# Patient Record
Sex: Male | Born: 1935 | Race: White | Hispanic: No | Marital: Married | Smoking: Never smoker
Health system: Southern US, Community
[De-identification: ages and names within clinical notes are randomized; demographics above are authoritative.]

## PROBLEM LIST (undated history)

## (undated) DIAGNOSIS — I10 Essential (primary) hypertension: Secondary | ICD-10-CM

## (undated) DIAGNOSIS — E785 Hyperlipidemia, unspecified: Secondary | ICD-10-CM

## (undated) DIAGNOSIS — I219 Acute myocardial infarction, unspecified: Secondary | ICD-10-CM

## (undated) DIAGNOSIS — C801 Malignant (primary) neoplasm, unspecified: Secondary | ICD-10-CM

## (undated) DIAGNOSIS — M1990 Unspecified osteoarthritis, unspecified site: Secondary | ICD-10-CM

## (undated) DIAGNOSIS — C61 Malignant neoplasm of prostate: Secondary | ICD-10-CM

## (undated) HISTORY — PX: APPENDECTOMY: SHX54

## (undated) HISTORY — PX: BACK SURGERY: SHX140

## (undated) HISTORY — PX: PROSTATE SURGERY: SHX751

## (undated) HISTORY — PX: TONSILLECTOMY: SUR1361

## (undated) HISTORY — PX: CARDIAC CATHETERIZATION: SHX172

## (undated) HISTORY — PX: CHOLECYSTECTOMY: SHX55

---

## 2007-01-03 ENCOUNTER — Emergency Department: Payer: Self-pay | Admitting: Emergency Medicine

## 2007-01-03 ENCOUNTER — Other Ambulatory Visit: Payer: Self-pay

## 2011-01-10 ENCOUNTER — Ambulatory Visit: Payer: Self-pay | Admitting: Urology

## 2011-07-02 DIAGNOSIS — E785 Hyperlipidemia, unspecified: Secondary | ICD-10-CM | POA: Insufficient documentation

## 2011-07-02 DIAGNOSIS — Z8546 Personal history of malignant neoplasm of prostate: Secondary | ICD-10-CM | POA: Insufficient documentation

## 2011-07-04 DIAGNOSIS — I10 Essential (primary) hypertension: Secondary | ICD-10-CM | POA: Insufficient documentation

## 2012-07-16 ENCOUNTER — Ambulatory Visit: Payer: Self-pay | Admitting: Orthopedic Surgery

## 2012-07-30 ENCOUNTER — Ambulatory Visit: Payer: Self-pay | Admitting: Hematology and Oncology

## 2012-07-30 LAB — CBC CANCER CENTER
Basophil %: 1 %
Eosinophil %: 3.5 %
HCT: 41.6 % (ref 40.0–52.0)
HGB: 14.2 g/dL (ref 13.0–18.0)
Lymphocyte %: 36.9 %
MCH: 29 pg (ref 26.0–34.0)
MCV: 85 fL (ref 80–100)
Monocyte #: 0.6 x10 3/mm (ref 0.2–1.0)
Monocyte %: 8.5 %
Neutrophil %: 50.1 %
Platelet: 243 x10 3/mm (ref 150–440)
RBC: 4.9 10*6/uL (ref 4.40–5.90)
WBC: 6.8 x10 3/mm (ref 3.8–10.6)

## 2012-07-30 LAB — COMPREHENSIVE METABOLIC PANEL WITH GFR
Albumin: 4 g/dL
Alkaline Phosphatase: 104 U/L
Anion Gap: 11
BUN: 24 mg/dL — ABNORMAL HIGH
Bilirubin,Total: 0.4 mg/dL
Calcium, Total: 10.9 mg/dL — ABNORMAL HIGH
Chloride: 100 mmol/L
Co2: 28 mmol/L
Creatinine: 1.31 mg/dL — ABNORMAL HIGH
EGFR (African American): >60
EGFR (Non-African Amer.): 53 — ABNORMAL LOW
Glucose: 137 mg/dL — ABNORMAL HIGH
Osmolality: 284
Potassium: 3.8 mmol/L
SGOT(AST): 30 U/L
SGPT (ALT): 36 U/L
Sodium: 139 mmol/L
Total Protein: 7.6 g/dL

## 2012-08-11 ENCOUNTER — Encounter (HOSPITAL_COMMUNITY): Payer: Self-pay | Admitting: Pharmacy Technician

## 2012-08-12 NOTE — Pre-Procedure Instructions (Deleted)
Skiler Olden  08/12/2012   Your procedure is scheduled on:  Thursday Aug 20, 2012.  Report to [facility] 3[facility] at 11:00 AM.  Call this number if you have problems the morning of surgery: [phone]   Remember:   Do not eat food or drink liquids after midnight.   Take these medicines the morning of surgery with A SIP OF WATER: Amlodipine (Norvasc), Gabapentin (Neurontin), Metoprolol (Lopressor)   Do not wear jewelry  Do not wear lotions or colognes. You may wear deodorant.  Men may shave face and neck.  Do not bring valuables to the hospital.  Contacts, dentures or bridgework may not be worn into surgery.  Leave suitcase in the car. After surgery it may be brought to your room.  For patients admitted to the hospital, checkout time is 11:00 AM the day of discharge.   Patients discharged the day of surgery will not be allowed to drive home.  Name and phone number of your driver: Family/Friend  Special Instructions: Shower using CHG 2 nights before surgery and the night before surgery.  If you shower the day of surgery use CHG.  Use special wash - you have one bottle of CHG for all showers.  You should use approximately 1/3 of the bottle for each shower.   Please read over the following fact sheets that you were given: Pain Booklet, Coughing and Deep Breathing, MRSA Information and Surgical Site Infection Prevention

## 2012-08-13 ENCOUNTER — Encounter (HOSPITAL_COMMUNITY)
Admission: RE | Admit: 2012-08-13 | Discharge: 2012-08-13 | Disposition: A | Discharge status: Home / Self Care | Payer: Medicare Other | Source: Ambulatory Visit | Attending: Neurosurgery | Admitting: Neurosurgery

## 2012-08-13 ENCOUNTER — Encounter (HOSPITAL_COMMUNITY): Payer: Self-pay

## 2012-08-13 ENCOUNTER — Ambulatory Visit (HOSPITAL_COMMUNITY)
Admission: RE | Admit: 2012-08-13 | Discharge: 2012-08-13 | Disposition: A | Discharge status: Home / Self Care | Payer: Medicare Other | Source: Ambulatory Visit | Attending: Neurosurgery | Admitting: Neurosurgery

## 2012-08-13 DIAGNOSIS — Z01812 Encounter for preprocedural laboratory examination: Secondary | ICD-10-CM | POA: Insufficient documentation

## 2012-08-13 DIAGNOSIS — R9431 Abnormal electrocardiogram [ECG] [EKG]: Secondary | ICD-10-CM | POA: Insufficient documentation

## 2012-08-13 DIAGNOSIS — I10 Essential (primary) hypertension: Secondary | ICD-10-CM | POA: Insufficient documentation

## 2012-08-13 DIAGNOSIS — Z01818 Encounter for other preprocedural examination: Secondary | ICD-10-CM | POA: Insufficient documentation

## 2012-08-13 DIAGNOSIS — I4510 Unspecified right bundle-branch block: Secondary | ICD-10-CM | POA: Insufficient documentation

## 2012-08-13 DIAGNOSIS — I252 Old myocardial infarction: Secondary | ICD-10-CM | POA: Insufficient documentation

## 2012-08-13 DIAGNOSIS — Z01810 Encounter for preprocedural cardiovascular examination: Secondary | ICD-10-CM | POA: Insufficient documentation

## 2012-08-13 HISTORY — DX: Unspecified osteoarthritis, unspecified site: M19.90

## 2012-08-13 HISTORY — DX: Acute myocardial infarction, unspecified: I21.9

## 2012-08-13 HISTORY — DX: Malignant (primary) neoplasm, unspecified: C80.1

## 2012-08-13 HISTORY — DX: Essential (primary) hypertension: I10

## 2012-08-13 HISTORY — DX: Hyperlipidemia, unspecified: E78.5

## 2012-08-13 LAB — CBC
HCT: 38.9 % — ABNORMAL LOW (ref 39.0–52.0)
Platelets: 249 10*3/uL (ref 150–400)
RDW: 13.8 % (ref 11.5–15.5)
WBC: 7.1 10*3/uL (ref 4.0–10.5)

## 2012-08-13 LAB — BASIC METABOLIC PANEL
Chloride: 102 mEq/L (ref 96–112)
Creatinine, Ser: 1.08 mg/dL (ref 0.50–1.35)
GFR calc Af Amer: 76 mL/min — ABNORMAL LOW (ref >90)
Sodium: 138 mEq/L (ref 135–145)

## 2012-08-13 LAB — SURGICAL PCR SCREEN
MRSA, PCR: NEGATIVE
Staphylococcus aureus: NEGATIVE

## 2012-08-13 NOTE — Pre-Procedure Instructions (Signed)
Carlos Perkins  08/13/2012   Your procedure is scheduled on:  Thursday Aug 20, 2012.  Report to [facility] 3[facility] at 11:00 AM.  Call this number if you have problems the morning of surgery: [phone]   Remember:   Do not eat food or drink liquids after midnight.   Take these medicines the morning of surgery with A SIP OF WATER: Amlodipine (Norvasc) & Metoprolol (Lopressor)   Do not wear jewelry  Do not wear lotions or colognes. You may wear deodorant.  Men may shave face and neck.  Do not bring valuables to the hospital.  Contacts, dentures or bridgework may not be worn into surgery.  Leave suitcase in the car. After surgery it may be brought to your room.  For patients admitted to the hospital, checkout time is 11:00 AM the day of discharge.   Patients discharged the day of surgery will not be allowed to drive home.  Name and phone number of your driver: Family/Friend  Special Instructions: Shower using CHG 2 nights before surgery and the night before surgery.  If you shower the day of surgery use CHG.  Use special wash - you have one bottle of CHG for all showers.  You should use approximately 1/3 of the bottle for each shower.   Please read over the following fact sheets that you were given: Pain Booklet, Coughing and Deep Breathing, MRSA Information and Surgical Site Infection Prevention

## 2012-08-13 NOTE — Progress Notes (Signed)
Carlos Perkins called [name] at [facility] and requested that Dr. Gerlene Fee enter orders into [facility]. [name] informed Carlos Perkins that Dr. Gerlene Fee was out of the office at this time on vacation.

## 2012-08-13 NOTE — Progress Notes (Signed)
Patient informed Nurse that he had a cardiac cath at [facility] after having a MI. Records requested. Cardiologist is Dr. Beryle Beams office [phone] fax [phone]. PCP is Dr. Dossie Arbour in [place], [place].

## 2012-08-17 NOTE — Progress Notes (Addendum)
Anesthesia chart review:  Patient is a 79 year old male posted for one level lumbar laminectomy/decopmression microdiscectomy on 08/20/12 by Dr. Gerlene Fee.  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  PCP was reported as Dr. Vonita Moss, but last office note was from 11/19/10.    Cardiologist was reported as Dr. Maisie Fus Povsic at [facility].  Currently, I only have a cardiac cath and EKG available.    Cardiac cath on 01/03/07 showed 100% stenosis of mid RCA s/p aspiration of thrombus and PTCA/BMS placement.  There was "insignificant" CAD of the left main (30%), LAD (proximal 30%, mid 50%), LCX. (proximal 30%, small RAMUS INT 70%)  EF 43%.     EKG on 08/13/12 showed NSR, right BBB, possible inferior infarct (age undetermined).    CXR on 08/13/12 showed no active disease. Mild degenerative changes thoracic spine.   Preoperative labs noted.  This chart had previously been reviewed by Grafton Folk, PA-C last week with recommendation for cardiac clearance preoperatively.  I spoke with [name] at [facility] today.  They have requested clearance from Dr. Beryle Beams, but have yet to hear back from him.  I'll follow-up once additional records available.  Velna Ochs [facility] Phone [phone] 08/17/2012 4:20 PM  Addendum: 08/19/12 1700 Dr. Trudee Grip office did receive a note of cardiac clearance from Dr. Beryle Beams.  Since patient was asymptomatic and fairly active, he did not recommend additional studies at this time.  He did feel that due to patient's history of CAD, he would be at somewhat higher risk for preoperative events.    At this point, [facility] has not faxed additional records because they were not included on the original medical release form.  Patient was last seen in April 2014.  I'll ask the nursing staff to have  patient update his medical release form on arrival and to re-request records at that time in hopes that we will have patient's most recent cardiology records for his anesthesiologist to review before surgery.

## 2012-08-20 ENCOUNTER — Encounter (HOSPITAL_COMMUNITY): Payer: Self-pay | Admitting: Anesthesiology

## 2012-08-20 ENCOUNTER — Inpatient Hospital Stay (HOSPITAL_COMMUNITY)
Admission: RE | Admit: 2012-08-20 | Discharge: 2012-08-21 | DRG: 491 | Disposition: A | Discharge status: Home / Self Care | Payer: Medicare Other | Source: Ambulatory Visit | Attending: Neurosurgery | Admitting: Neurosurgery

## 2012-08-20 ENCOUNTER — Inpatient Hospital Stay (HOSPITAL_COMMUNITY): Payer: Medicare Other

## 2012-08-20 ENCOUNTER — Inpatient Hospital Stay (HOSPITAL_COMMUNITY): Payer: Medicare Other | Admitting: Anesthesiology

## 2012-08-20 ENCOUNTER — Encounter (HOSPITAL_COMMUNITY)
Admission: RE | Disposition: A | Discharge status: Home / Self Care | Payer: Self-pay | Source: Ambulatory Visit | Attending: Neurosurgery

## 2012-08-20 DIAGNOSIS — Z9861 Coronary angioplasty status: Secondary | ICD-10-CM

## 2012-08-20 DIAGNOSIS — Z8546 Personal history of malignant neoplasm of prostate: Secondary | ICD-10-CM

## 2012-08-20 DIAGNOSIS — I252 Old myocardial infarction: Secondary | ICD-10-CM

## 2012-08-20 DIAGNOSIS — E785 Hyperlipidemia, unspecified: Secondary | ICD-10-CM | POA: Diagnosis present

## 2012-08-20 DIAGNOSIS — I2510 Atherosclerotic heart disease of native coronary artery without angina pectoris: Secondary | ICD-10-CM | POA: Diagnosis present

## 2012-08-20 DIAGNOSIS — I10 Essential (primary) hypertension: Secondary | ICD-10-CM | POA: Diagnosis present

## 2012-08-20 DIAGNOSIS — Z79899 Other long term (current) drug therapy: Secondary | ICD-10-CM

## 2012-08-20 DIAGNOSIS — M5126 Other intervertebral disc displacement, lumbar region: Principal | ICD-10-CM | POA: Diagnosis present

## 2012-08-20 DIAGNOSIS — Z7982 Long term (current) use of aspirin: Secondary | ICD-10-CM

## 2012-08-20 HISTORY — PX: LUMBAR LAMINECTOMY/DECOMPRESSION MICRODISCECTOMY: SHX5026

## 2012-08-20 SURGERY — LUMBAR LAMINECTOMY/DECOMPRESSION MICRODISCECTOMY 1 LEVEL
Anesthesia: General | Site: Back | Laterality: Left | Wound class: Clean

## 2012-08-20 MED ORDER — NEOSTIGMINE METHYLSULFATE 1 MG/ML IJ SOLN
INTRAMUSCULAR | Status: DC | PRN
  Administered 2012-08-20: 4 mg via INTRAVENOUS

## 2012-08-20 MED ORDER — KCL IN DEXTROSE-NACL 20-5-0.45 MEQ/L-%-% IV SOLN
INTRAVENOUS | Status: AC
  Administered 2012-08-20: 1000 mL
  Filled 2012-08-20: qty 1000

## 2012-08-20 MED ORDER — LISINOPRIL 40 MG PO TABS
40.0000 mg | ORAL_TABLET | Freq: Every day | ORAL | Status: DC
  Administered 2012-08-20: 40 mg via ORAL
  Filled 2012-08-20 (×2): qty 1

## 2012-08-20 MED ORDER — DEXAMETHASONE SODIUM PHOSPHATE 4 MG/ML IJ SOLN: 4.0000 mg | Freq: Four times a day (QID) | INTRAMUSCULAR | Status: AC

## 2012-08-20 MED ORDER — HYDROMORPHONE HCL PF 1 MG/ML IJ SOLN
0.2500 mg | INTRAMUSCULAR | Status: DC | PRN
  Administered 2012-08-20 (×2): 0.5 mg via INTRAVENOUS

## 2012-08-20 MED ORDER — LACTATED RINGERS IV SOLN
INTRAVENOUS | Status: DC | PRN
  Administered 2012-08-20 (×2): via INTRAVENOUS

## 2012-08-20 MED ORDER — GABAPENTIN 300 MG PO CAPS
600.0000 mg | ORAL_CAPSULE | Freq: Every day | ORAL | Status: DC
  Administered 2012-08-20: 600 mg via ORAL
  Filled 2012-08-20 (×2): qty 2

## 2012-08-20 MED ORDER — SODIUM CHLORIDE 0.9 % IV SOLN
INTRAVENOUS | Status: AC
  Filled 2012-08-20: qty 500

## 2012-08-20 MED ORDER — SODIUM CHLORIDE 0.9 % IJ SOLN
3.0000 mL | Freq: Two times a day (BID) | INTRAMUSCULAR | Status: DC
  Administered 2012-08-20: 3 mL via INTRAVENOUS

## 2012-08-20 MED ORDER — METOPROLOL TARTRATE 25 MG PO TABS
25.0000 mg | ORAL_TABLET | Freq: Two times a day (BID) | ORAL | Status: DC
Start: 2012-08-20 — End: 2012-08-21
  Administered 2012-08-20: 25 mg via ORAL
  Filled 2012-08-20 (×3): qty 1

## 2012-08-20 MED ORDER — HYDROMORPHONE HCL PF 1 MG/ML IJ SOLN: 1.0000 mg | INTRAMUSCULAR | Status: DC | PRN

## 2012-08-20 MED ORDER — ONDANSETRON HCL 4 MG/2ML IJ SOLN: 4.0000 mg | INTRAMUSCULAR | Status: DC | PRN

## 2012-08-20 MED ORDER — ACETAMINOPHEN 325 MG PO TABS: 650.0000 mg | ORAL_TABLET | ORAL | Status: DC | PRN

## 2012-08-20 MED ORDER — EPHEDRINE SULFATE 50 MG/ML IJ SOLN
INTRAMUSCULAR | Status: DC | PRN
  Administered 2012-08-20: 5 mg via INTRAVENOUS
  Administered 2012-08-20: 10 mg via INTRAVENOUS
  Administered 2012-08-20: 5 mg via INTRAVENOUS

## 2012-08-20 MED ORDER — BACITRACIN 50000 UNITS IM SOLR
INTRAMUSCULAR | Status: AC
  Filled 2012-08-20: qty 1

## 2012-08-20 MED ORDER — CEFAZOLIN SODIUM-DEXTROSE 2-3 GM-% IV SOLR
2.0000 g | Freq: Three times a day (TID) | INTRAVENOUS | Status: DC
  Administered 2012-08-20 – 2012-08-21 (×2): 2 g via INTRAVENOUS
  Filled 2012-08-20 (×3): qty 50

## 2012-08-20 MED ORDER — LIDOCAINE HCL (CARDIAC) 20 MG/ML IV SOLN
INTRAVENOUS | Status: DC | PRN
  Administered 2012-08-20: 80 mg via INTRAVENOUS

## 2012-08-20 MED ORDER — KETOROLAC TROMETHAMINE 30 MG/ML IJ SOLN
15.0000 mg | Freq: Four times a day (QID) | INTRAMUSCULAR | Status: DC
  Administered 2012-08-20 – 2012-08-21 (×3): 15 mg via INTRAVENOUS
  Filled 2012-08-20 (×7): qty 1

## 2012-08-20 MED ORDER — THROMBIN 5000 UNITS EX SOLR
CUTANEOUS | Status: DC | PRN
  Administered 2012-08-20 (×2): 5000 [IU] via TOPICAL

## 2012-08-20 MED ORDER — MENTHOL 3 MG MT LOZG: 1.0000 | LOZENGE | OROMUCOSAL | Status: DC | PRN

## 2012-08-20 MED ORDER — ZOLPIDEM TARTRATE 5 MG PO TABS: 5.0000 mg | ORAL_TABLET | Freq: Every evening | ORAL | Status: DC | PRN

## 2012-08-20 MED ORDER — 0.9 % SODIUM CHLORIDE (POUR BTL) OPTIME
TOPICAL | Status: DC | PRN
  Administered 2012-08-20: 1000 mL

## 2012-08-20 MED ORDER — KETOROLAC TROMETHAMINE 15 MG/ML IJ SOLN
INTRAMUSCULAR | Status: DC | PRN
  Administered 2012-08-20: 15 mg via INTRAVENOUS

## 2012-08-20 MED ORDER — SODIUM CHLORIDE 0.9 % IR SOLN
Status: DC | PRN
  Administered 2012-08-20: 12:00:00

## 2012-08-20 MED ORDER — DEXAMETHASONE SODIUM PHOSPHATE 4 MG/ML IJ SOLN
INTRAMUSCULAR | Status: DC | PRN
  Administered 2012-08-20: 10 mg via INTRAVENOUS

## 2012-08-20 MED ORDER — BUPIVACAINE HCL (PF) 0.5 % IJ SOLN
INTRAMUSCULAR | Status: DC | PRN
  Administered 2012-08-20: 20 mL

## 2012-08-20 MED ORDER — ONDANSETRON HCL 4 MG/2ML IJ SOLN: 4.0000 mg | Freq: Four times a day (QID) | INTRAMUSCULAR | Status: DC | PRN

## 2012-08-20 MED ORDER — HYDROMORPHONE HCL PF 1 MG/ML IJ SOLN
INTRAMUSCULAR | Status: AC
  Filled 2012-08-20: qty 1

## 2012-08-20 MED ORDER — OXYCODONE HCL 5 MG PO TABS
5.0000 mg | ORAL_TABLET | Freq: Once | ORAL | Status: AC | PRN
  Administered 2012-08-20: 5 mg via ORAL

## 2012-08-20 MED ORDER — ASPIRIN EC 81 MG PO TBEC
81.0000 mg | DELAYED_RELEASE_TABLET | Freq: Every day | ORAL | Status: DC
  Administered 2012-08-20: 81 mg via ORAL
  Filled 2012-08-20 (×2): qty 1

## 2012-08-20 MED ORDER — KCL IN DEXTROSE-NACL 20-5-0.45 MEQ/L-%-% IV SOLN
80.0000 mL/h | INTRAVENOUS | Status: DC
  Filled 2012-08-20 (×3): qty 1000

## 2012-08-20 MED ORDER — GLYCOPYRROLATE 0.2 MG/ML IJ SOLN
INTRAMUSCULAR | Status: DC | PRN
  Administered 2012-08-20: 0.6 mg via INTRAVENOUS

## 2012-08-20 MED ORDER — CHLORTHALIDONE 25 MG PO TABS
25.0000 mg | ORAL_TABLET | Freq: Every day | ORAL | Status: DC
  Administered 2012-08-20: 25 mg via ORAL
  Filled 2012-08-20 (×2): qty 1

## 2012-08-20 MED ORDER — FENTANYL CITRATE 0.05 MG/ML IJ SOLN
INTRAMUSCULAR | Status: DC | PRN
  Administered 2012-08-20: 100 ug via INTRAVENOUS
  Administered 2012-08-20 (×3): 50 ug via INTRAVENOUS

## 2012-08-20 MED ORDER — AMLODIPINE BESYLATE 10 MG PO TABS
10.0000 mg | ORAL_TABLET | Freq: Every day | ORAL | Status: DC
  Filled 2012-08-20 (×2): qty 1

## 2012-08-20 MED ORDER — HYDROCODONE-ACETAMINOPHEN 5-325 MG PO TABS: 1.0000 | ORAL_TABLET | ORAL | Status: DC | PRN

## 2012-08-20 MED ORDER — HEMOSTATIC AGENTS (NO CHARGE) OPTIME
TOPICAL | Status: DC | PRN
  Administered 2012-08-20: 1 via TOPICAL

## 2012-08-20 MED ORDER — LIDOCAINE HCL 4 % MT SOLN
OROMUCOSAL | Status: DC | PRN
  Administered 2012-08-20: 4 mL via TOPICAL

## 2012-08-20 MED ORDER — KETOROLAC TROMETHAMINE 30 MG/ML IJ SOLN
INTRAMUSCULAR | Status: AC
  Administered 2012-08-20: 15 mg
  Filled 2012-08-20: qty 1

## 2012-08-20 MED ORDER — ONDANSETRON HCL 4 MG/2ML IJ SOLN
INTRAMUSCULAR | Status: DC | PRN
  Administered 2012-08-20: 4 mg via INTRAVENOUS

## 2012-08-20 MED ORDER — ROCURONIUM BROMIDE 100 MG/10ML IV SOLN
INTRAVENOUS | Status: DC | PRN
  Administered 2012-08-20: 50 mg via INTRAVENOUS

## 2012-08-20 MED ORDER — PHENOL 1.4 % MT LIQD: 1.0000 | OROMUCOSAL | Status: DC | PRN

## 2012-08-20 MED ORDER — DEXAMETHASONE 4 MG PO TABS
4.0000 mg | ORAL_TABLET | Freq: Four times a day (QID) | ORAL | Status: AC
  Administered 2012-08-20 – 2012-08-21 (×2): 4 mg via ORAL
  Filled 2012-08-20: qty 1

## 2012-08-20 MED ORDER — ATORVASTATIN CALCIUM 10 MG PO TABS
10.0000 mg | ORAL_TABLET | Freq: Every day | ORAL | Status: DC
  Administered 2012-08-20: 10 mg via ORAL
  Filled 2012-08-20 (×2): qty 1

## 2012-08-20 MED ORDER — MIDAZOLAM HCL 5 MG/5ML IJ SOLN
INTRAMUSCULAR | Status: DC | PRN
  Administered 2012-08-20: 2 mg via INTRAVENOUS

## 2012-08-20 MED ORDER — CYCLOBENZAPRINE HCL 10 MG PO TABS: 10.0000 mg | ORAL_TABLET | Freq: Three times a day (TID) | ORAL | Status: DC | PRN

## 2012-08-20 MED ORDER — OXYCODONE HCL 5 MG PO TABS
ORAL_TABLET | ORAL | Status: AC
  Filled 2012-08-20: qty 1

## 2012-08-20 MED ORDER — OXYCODONE HCL 5 MG/5ML PO SOLN: 5.0000 mg | Freq: Once | ORAL | Status: AC | PRN

## 2012-08-20 MED ORDER — SODIUM CHLORIDE 0.9 % IJ SOLN: 3.0000 mL | INTRAMUSCULAR | Status: DC | PRN

## 2012-08-20 MED ORDER — CEFAZOLIN SODIUM-DEXTROSE 2-3 GM-% IV SOLR
INTRAVENOUS | Status: AC
  Administered 2012-08-20: 2 g via INTRAVENOUS
  Filled 2012-08-20: qty 50

## 2012-08-20 MED ORDER — ACETAMINOPHEN 650 MG RE SUPP: 650.0000 mg | RECTAL | Status: DC | PRN

## 2012-08-20 MED ORDER — PROPOFOL 10 MG/ML IV BOLUS
INTRAVENOUS | Status: DC | PRN
  Administered 2012-08-20: 150 mg via INTRAVENOUS

## 2012-08-20 SURGICAL SUPPLY — 45 items
BAG DECANTER FOR FLEXI CONT (MISCELLANEOUS) ×2 IMPLANT
BENZOIN TINCTURE PRP APPL 2/3 (GAUZE/BANDAGES/DRESSINGS) ×4 IMPLANT
BLADE SURG ROTATE 9660 (MISCELLANEOUS) ×2 IMPLANT
BRUSH SCRUB EZ PLAIN DRY (MISCELLANEOUS) ×2 IMPLANT
BUR CUTTER 7.0 ROUND (BURR) ×2 IMPLANT
BUR MATCHSTICK NEURO 3.0 LAGG (BURR) ×2 IMPLANT
CANISTER SUCTION 2500CC (MISCELLANEOUS) ×2 IMPLANT
CLOTH BEACON ORANGE TIMEOUT ST (SAFETY) ×2 IMPLANT
CONT SPEC 4OZ CLIKSEAL STRL BL (MISCELLANEOUS) ×2 IMPLANT
DERMABOND ADVANCED (GAUZE/BANDAGES/DRESSINGS) ×1
DERMABOND ADVANCED .7 DNX12 (GAUZE/BANDAGES/DRESSINGS) ×1 IMPLANT
DRAPE LAPAROTOMY 100X72X124 (DRAPES) ×2 IMPLANT
DRAPE MICROSCOPE ZEISS OPMI (DRAPES) ×2 IMPLANT
DRAPE SURG 17X23 STRL (DRAPES) ×4 IMPLANT
DRESSING TELFA 8X3 (GAUZE/BANDAGES/DRESSINGS) ×2 IMPLANT
DURAPREP 26ML APPLICATOR (WOUND CARE) ×2 IMPLANT
ELECT REM PT RETURN 9FT ADLT (ELECTROSURGICAL) ×2
ELECTRODE REM PT RTRN 9FT ADLT (ELECTROSURGICAL) ×1 IMPLANT
GAUZE SPONGE 4X4 16PLY XRAY LF (GAUZE/BANDAGES/DRESSINGS) IMPLANT
GLOVE ECLIPSE 7.5 STRL STRAW (GLOVE) ×2 IMPLANT
GLOVE ECLIPSE 8.5 STRL (GLOVE) ×2 IMPLANT
GLOVE INDICATOR 7.0 STRL GRN (GLOVE) ×2 IMPLANT
GLOVE INDICATOR 8.5 STRL (GLOVE) ×2 IMPLANT
GLOVE OPTIFIT SS 6.5 STRL BRWN (GLOVE) ×2 IMPLANT
GOWN BRE IMP SLV AUR LG STRL (GOWN DISPOSABLE) IMPLANT
GOWN BRE IMP SLV AUR XL STRL (GOWN DISPOSABLE) ×2 IMPLANT
GOWN STRL REIN 2XL LVL4 (GOWN DISPOSABLE) ×2 IMPLANT
KIT BASIN OR (CUSTOM PROCEDURE TRAY) ×2 IMPLANT
KIT ROOM TURNOVER OR (KITS) ×2 IMPLANT
NEEDLE HYPO 22GX1.5 SAFETY (NEEDLE) ×2 IMPLANT
NEEDLE SPNL 22GX3.5 QUINCKE BK (NEEDLE) ×4 IMPLANT
NS IRRIG 1000ML POUR BTL (IV SOLUTION) ×2 IMPLANT
PACK LAMINECTOMY NEURO (CUSTOM PROCEDURE TRAY) ×2 IMPLANT
PAD ARMBOARD 7.5X6 YLW CONV (MISCELLANEOUS) ×6 IMPLANT
PATTIES SURGICAL .75X.75 (GAUZE/BANDAGES/DRESSINGS) ×2 IMPLANT
RUBBERBAND STERILE (MISCELLANEOUS) ×4 IMPLANT
SPONGE GAUZE 4X4 12PLY (GAUZE/BANDAGES/DRESSINGS) ×2 IMPLANT
SPONGE SURGIFOAM ABS GEL SZ50 (HEMOSTASIS) ×2 IMPLANT
STRIP CLOSURE SKIN 1/2X4 (GAUZE/BANDAGES/DRESSINGS) ×2 IMPLANT
SUT VIC AB 2-0 OS6 18 (SUTURE) ×6 IMPLANT
SUT VIC AB 3-0 CP2 18 (SUTURE) ×2 IMPLANT
SYR 20ML ECCENTRIC (SYRINGE) ×2 IMPLANT
TOWEL OR 17X24 6PK STRL BLUE (TOWEL DISPOSABLE) ×2 IMPLANT
TOWEL OR 17X26 10 PK STRL BLUE (TOWEL DISPOSABLE) ×2 IMPLANT
WATER STERILE IRR 1000ML POUR (IV SOLUTION) ×2 IMPLANT

## 2012-08-20 NOTE — Progress Notes (Signed)
Requested last office not, ECHO ONLY HAD 2008, [facility] TO FAX ASAP.

## 2012-08-20 NOTE — Preoperative (Signed)
Beta Blockers   Reason not to administer Beta Blockers:Not Applicable 

## 2012-08-20 NOTE — Op Note (Signed)
Preop diagnosis: Herniated disc L4-5 left with L5 nerve root compression Postop diagnosis: Same Procedure: Left L4-5 intralaminar laminotomy for excision of herniated disc with operating microscope Surgeon: Makalynn Berwanger Assistant: Elsner  After being placed the prone position the patient's back was prepped and draped in the usual sterile fashion. Localizing x-ray was taken prior to incision to identify the appropriate level. Midline incision was made above the spinous processes of L4 and L5. Using Bovie cutting current the incision was carried on the spinous processes. Sub-periosteal dissection was then carried out on the left side the spinous processes and lamina and self-retaining tract was placed for exposure. X-ray showed approach the appropriate level. Using the high-speed drill the inferior one third of the L4 lamina the medial one third of the facet joint the superior one half of the L5 lamina were removed. Residual bone and ligamentum flavum removed in a piecemeal fashion. The microscope was then draped brought in the field and used for the remainder of the case. Using microdissection technique the lateral aspect of the thecal sac and L5 nerve were identified. Further coagulation was carried out down before the canal to identify the L4-5 disc was found markedly herniated beneath the nerve over the inferior fragment. After coagulating on the annulus this was incised a 15 blade. Using pituitary rongeurs and curettes a thorough displaced cleanout was carried out on the same time great care was taken to avoid injury to the neural elements this was successfully done. At this time inspection was carried out all directions for any evidence of residual compression and none could be identified. Irrigation was carried out and any bleeding control proper coagulation Gelfoam. The was then closed in multiple layers of Vicryl on the muscle fascia subcutaneous and subcuticular tissues. Dermabond Steri-Strips were placed  on the skin. Shortness was then applied the patient was extubated and taken to recovery room in stable condition.

## 2012-08-20 NOTE — Anesthesia Procedure Notes (Signed)
Procedure Name: Intubation Date/Time: 08/20/2012 11:50 AM Performed by: Armandina Gemma Pre-anesthesia Checklist: Patient identified, Timeout performed, Emergency Drugs available, Suction available and Patient being monitored Patient Re-evaluated:Patient Re-evaluated prior to inductionOxygen Delivery Method: Circle system utilized Preoxygenation: Pre-oxygenation with 100% oxygen Intubation Type: IV induction Ventilation: Mask ventilation without difficulty Laryngoscope Size: Miller and 2 Grade View: Grade I Tube type: Oral Tube size: 7.5 mm Number of attempts: 1 Airway Equipment and Method: Stylet Placement Confirmation: ETT inserted through vocal cords under direct vision,  breath sounds checked- equal and bilateral and positive ETCO2 Secured at: 22 cm Tube secured with: Tape Dental Injury: Teeth and Oropharynx as per pre-operative assessment  Comments: IV induction Hodierne- intubation AM CRNA atraumatic- teeth and mouth as preop

## 2012-08-20 NOTE — H&P (Signed)
  Carlos Perkins is an 78 y.o. male.   Chief Complaint: Back and left lower extremity pain HPI: The patient is a 78 year old gentleman who presented with back and left lower extremity pain. He was tried on conservative therapy without improvement and underwent imaging studies which showed I nerve root defect at L4-5 on the left. After failing further conservative therapy the options were discussed and the patient requested surgery and now comes for a left L4-5 microdiscectomy. I've had a long discussion with him regarding the risks and benefits of surgical intervention. The risks discussed include but are not limited to bleeding infection weakness numbness paralysis spinal fluid leakage coma and death. We have discussed alternative methods of therapy offered risks and benefits of nonintervention. He's had the opportunity to ask numerous questions and appears to understand. With this information in hand he has requested we proceed with surgery.  Past Medical History  Diagnosis Date  . Myocardial infarction   . Hypertension   . Cancer     Prostate CA approximately 11 years ago  . Hyperlipidemia   . Arthritis     Past Surgical History  Procedure Laterality Date  . Tonsillectomy    . Cholecystectomy    . Appendectomy    . Back surgery    . Cardiac catheterization      stent x 1  . Prostate surgery      No family history on file. Social History:  reports that he has never smoked. He does not have any smokeless tobacco history on file. He reports that he does not drink alcohol or use illicit drugs.  Allergies: No Known Allergies  Medications Prior to Admission  Medication Sig Dispense Refill  . amLODipine (NORVASC) 10 MG tablet Take 10 mg by mouth daily.      Marland Kitchen aspirin EC 81 MG tablet Take 81 mg by mouth daily.      Marland Kitchen atorvastatin (LIPITOR) 10 MG tablet Take 10 mg by mouth daily.      . chlorthalidone (HYGROTON) 25 MG tablet Take 25 mg by mouth daily.      Marland Kitchen gabapentin  (NEURONTIN) 300 MG capsule Take 600 mg by mouth at bedtime.      Marland Kitchen lisinopril (PRINIVIL,ZESTRIL) 40 MG tablet Take 40 mg by mouth daily.      . metoprolol tartrate (LOPRESSOR) 25 MG tablet Take 25 mg by mouth 2 (two) times daily.      . naproxen sodium (ANAPROX) 220 MG tablet Take 220-440 mg by mouth daily.        No results found for this or any previous visit (from the past 48 hour(s)). No results found.  Review of systems not obtained due to patient factors.  Blood pressure 129/83, pulse 62, temperature 98.1 F (36.7 C), resp. rate 16, SpO2 97.00%.  The patient is awake or and oriented. His no facial asymmetry. His gait is slow and somewhat antalgic. Reflexes are 1-2+ and equal. He some decreased strength at the extensor hallucis long some left and his sensation is intact Assessment/Plan Impression is that of an L5 radiculopathy on the left due to compression at L4-5 on the left. The plan is for a left L4-5 decompression and evaluation of the L4-5 disc.  [name], MD 08/20/2012, 11:28 AM

## 2012-08-20 NOTE — Transfer of Care (Signed)
Immediate Anesthesia Transfer of Care Note  Patient: Carlos Perkins  Procedure(s) Performed: Procedure(s) with comments: LUMBAR LAMINECTOMY/DECOMPRESSION MICRODISCECTOMY 1 LEVEL (Left) - lumbar four-five  Patient Location: PACU  Anesthesia Type:General  Level of Consciousness: awake and alert   Airway & Oxygen Therapy: Patient Spontanous Breathing and Patient connected to face mask oxygen  Post-op Assessment: Report given to PACU RN and Post -op Vital signs reviewed and stable  Post vital signs: Reviewed and stable  Complications: No apparent anesthesia complications

## 2012-08-20 NOTE — Anesthesia Preprocedure Evaluation (Signed)
Anesthesia Evaluation  Patient identified by MRN, date of birth, ID band Patient awake    Reviewed: Allergy & Precautions, H&P , NPO status , Patient's Chart, lab work & pertinent test results  Airway Mallampati: II  Neck ROM: full    Dental   Pulmonary          Cardiovascular hypertension, + Past MI and + Cardiac Stents     Neuro/Psych    GI/Hepatic   Endo/Other    Renal/GU      Musculoskeletal  (+) Arthritis -,   Abdominal   Peds  Hematology   Anesthesia Other Findings   Reproductive/Obstetrics                           Anesthesia Physical Anesthesia Plan  ASA: III  Anesthesia Plan: General   Post-op Pain Management:    Induction: Intravenous  Airway Management Planned: Oral ETT  Additional Equipment:   Intra-op Plan:   Post-operative Plan: Extubation in OR  Informed Consent: I have reviewed the patients History and Physical, chart, labs and discussed the procedure including the risks, benefits and alternatives for the proposed anesthesia with the patient or authorized representative who has indicated his/her understanding and acceptance.     Plan Discussed with: CRNA, Anesthesiologist and Surgeon  Anesthesia Plan Comments:         Anesthesia Quick Evaluation

## 2012-08-20 NOTE — Plan of Care (Signed)
Problem: Consults Goal: Diagnosis - Spinal Surgery Outcome: Completed/Met Date Met:  08/20/12 Microdiscectomy     

## 2012-08-20 NOTE — Progress Notes (Signed)
NOTIFIED DR. KRITZER OF NEED FOR ORDERS.

## 2012-08-20 NOTE — Anesthesia Postprocedure Evaluation (Signed)
Anesthesia Post Note  Patient: Carlos Perkins  Procedure(s) Performed: Procedure(s) (LRB): LUMBAR LAMINECTOMY/DECOMPRESSION MICRODISCECTOMY 1 LEVEL (Left)  Anesthesia type: General  Patient location: PACU  Post pain: Pain level controlled and Adequate analgesia  Post assessment: Post-op Vital signs reviewed, Patient's Cardiovascular Status Stable, Respiratory Function Stable, Patent Airway and Pain level controlled  Last Vitals:  Filed Vitals:   08/20/12 1326  BP: 140/70  Pulse: 78  Temp: 36.6 C  Resp: 15    Post vital signs: Reviewed and stable  Level of consciousness: awake, alert  and oriented  Complications: No apparent anesthesia complications

## 2012-08-21 ENCOUNTER — Encounter (HOSPITAL_COMMUNITY): Payer: Self-pay | Admitting: Neurosurgery

## 2012-08-21 MED ORDER — PNEUMOCOCCAL VAC POLYVALENT 25 MCG/0.5ML IJ INJ
0.5000 mL | INJECTION | INTRAMUSCULAR | Status: DC
  Filled 2012-08-21: qty 0.5

## 2012-08-21 MED ORDER — HYDROCODONE-ACETAMINOPHEN 5-325 MG PO TABS: 1.0000 | ORAL_TABLET | ORAL | Status: DC | PRN

## 2012-08-21 NOTE — Discharge Summary (Signed)
Physician Discharge Summary  Patient ID: Carlos Perkins MRN: [id number] DOB/AGE: May 15, 1934 78 y.o.  Admit date: 08/20/2012 Discharge date: 08/21/2012  Admission Diagnoses:  Discharge Diagnoses:  Active Problems:   * No active hospital problems. *   Discharged Condition: good  Hospital Course: Surgery one day for HNP L45. Did well. No pain the next day. Up walking without difficulty. Home POD 1 , specific instructions given.  Consults: None  Significant Diagnostic Studies: none  Treatments: surgery: Left L45 microdiscectomy  Discharge Exam: Blood pressure 123/66, pulse 65, temperature 98.1 F (36.7 C), temperature source Oral, resp. rate 18, SpO2 95.00%. Incision/Wound:clean and dry  Disposition: Final discharge disposition not confirmed  Discharge Orders   Future Orders Complete By Expires     Call MD for:  difficulty breathing, headache or visual disturbances  As directed     Call MD for:  hives  As directed     Call MD for:  persistant nausea and vomiting  As directed     Call MD for:  redness, tenderness, or signs of infection (pain, swelling, redness, odor or green/yellow discharge around incision site)  As directed     Call MD for:  severe uncontrolled pain  As directed     Call MD for:  temperature >100.4  As directed     Diet general  As directed     Discharge instructions  As directed     Comments:      Mostly bedrest. Get up 9 or 10 times each day and walk for 15-20 minutes each time. Very little sitting the first week. No riding in the car until your first post op appointment. If you had neck surgery...may shower from the chest down. If you had low back surgery....you may shower with a saran wrap covering over the incision. Take your pain medicine as needed...and other medicines that you are instructed to take. Call for an appointment...[phone].        Medication List    TAKE these medications       amLODipine 10 MG tablet  Commonly known as:   NORVASC  Take 10 mg by mouth daily.     aspirin EC 81 MG tablet  Take 81 mg by mouth daily.     atorvastatin 10 MG tablet  Commonly known as:  LIPITOR  Take 10 mg by mouth daily.     chlorthalidone 25 MG tablet  Commonly known as:  HYGROTON  Take 25 mg by mouth daily.     gabapentin 300 MG capsule  Commonly known as:  NEURONTIN  Take 600 mg by mouth at bedtime.     HYDROcodone-acetaminophen 5-325 MG per tablet  Commonly known as:  NORCO/VICODIN  Take 1-2 tablets by mouth every 4 (four) hours as needed.     lisinopril 40 MG tablet  Commonly known as:  PRINIVIL,ZESTRIL  Take 40 mg by mouth daily.     metoprolol tartrate 25 MG tablet  Commonly known as:  LOPRESSOR  Take 25 mg by mouth 2 (two) times daily.     naproxen sodium 220 MG tablet  Commonly known as:  ANAPROX  Take 220-440 mg by mouth daily.         At home rest most of the time. Get up 9 or 10 times each day and take a 15 or 20 minute walk. No riding in the car and to your first postoperative appointment. If you have neck surgery you may shower from the chest down starting on  the third postoperative day. If you had back surgery he may start showering on the third postoperative day with saran wrap wrapped around your incisional area 3 times. After the shower remove the saran wrap. Take pain medicine as needed and other medications as instructed. Call my office for an appointment.  SignedReinaldo Meeker, MD 08/21/2012, 9:42 AM

## 2012-08-30 ENCOUNTER — Ambulatory Visit: Payer: Self-pay | Admitting: Hematology and Oncology

## 2012-09-17 LAB — COMPREHENSIVE METABOLIC PANEL
Albumin: 3.6 g/dL (ref 3.4–5.0)
Alkaline Phosphatase: 112 U/L (ref 50–136)
Anion Gap: 6 — ABNORMAL LOW (ref 7–16)
BUN: 24 mg/dL — ABNORMAL HIGH (ref 7–18)
Bilirubin,Total: 0.4 mg/dL (ref 0.2–1.0)
Calcium, Total: 10.7 mg/dL — ABNORMAL HIGH (ref 8.5–10.1)
Chloride: 103 mmol/L (ref 98–107)
Creatinine: 1.5 mg/dL — ABNORMAL HIGH (ref 0.60–1.30)
Total Protein: 7 g/dL (ref 6.4–8.2)

## 2012-09-17 LAB — CBC CANCER CENTER
Eosinophil #: 0.3 x10 3/mm (ref 0.0–0.7)
Eosinophil %: 4 %
HCT: 39.2 % — ABNORMAL LOW (ref 40.0–52.0)
Lymphocyte #: 2.5 x10 3/mm (ref 1.0–3.6)
MCHC: 34.1 g/dL (ref 32.0–36.0)
Monocyte #: 0.6 x10 3/mm (ref 0.2–1.0)
Neutrophil %: 54.4 %
Platelet: 228 x10 3/mm (ref 150–440)
RDW: 14 % (ref 11.5–14.5)
WBC: 7.5 x10 3/mm (ref 3.8–10.6)

## 2012-09-18 LAB — PSA: PSA: 6.7 ng/mL — ABNORMAL HIGH (ref 0.0–4.0)

## 2012-09-29 ENCOUNTER — Ambulatory Visit: Payer: Self-pay | Admitting: Hematology and Oncology

## 2012-09-29 LAB — COMPREHENSIVE METABOLIC PANEL
Anion Gap: 7 (ref 7–16)
BUN: 24 mg/dL — ABNORMAL HIGH (ref 7–18)
Calcium, Total: 10.9 mg/dL — ABNORMAL HIGH (ref 8.5–10.1)
Co2: 30 mmol/L (ref 21–32)
EGFR (African American): 55 — ABNORMAL LOW
EGFR (Non-African Amer.): 48 — ABNORMAL LOW
Osmolality: 282 (ref 275–301)
Potassium: 4.1 mmol/L (ref 3.5–5.1)
SGOT(AST): 17 U/L (ref 15–37)
SGPT (ALT): 27 U/L (ref 12–78)
Total Protein: 7.4 g/dL (ref 6.4–8.2)

## 2012-09-29 LAB — CBC CANCER CENTER
Basophil #: 0.1 x10 3/mm (ref 0.0–0.1)
Eosinophil #: 0.3 x10 3/mm (ref 0.0–0.7)
HCT: 40.5 % (ref 40.0–52.0)
HGB: 14 g/dL (ref 13.0–18.0)
MCHC: 34.7 g/dL (ref 32.0–36.0)
Monocyte #: 0.5 x10 3/mm (ref 0.2–1.0)
Platelet: 290 x10 3/mm (ref 150–440)
RBC: 4.77 10*6/uL (ref 4.40–5.90)
RDW: 14.5 % (ref 11.5–14.5)

## 2012-10-30 ENCOUNTER — Ambulatory Visit: Payer: Self-pay | Admitting: Hematology and Oncology

## 2013-01-05 ENCOUNTER — Ambulatory Visit: Payer: Self-pay | Admitting: Hematology and Oncology

## 2013-03-02 DIAGNOSIS — M5416 Radiculopathy, lumbar region: Secondary | ICD-10-CM | POA: Insufficient documentation

## 2013-03-11 DIAGNOSIS — R339 Retention of urine, unspecified: Secondary | ICD-10-CM | POA: Insufficient documentation

## 2013-03-17 DIAGNOSIS — M48061 Spinal stenosis, lumbar region without neurogenic claudication: Secondary | ICD-10-CM | POA: Insufficient documentation

## 2013-03-30 ENCOUNTER — Ambulatory Visit: Payer: Self-pay | Admitting: Hematology and Oncology

## 2013-04-13 ENCOUNTER — Ambulatory Visit: Payer: Self-pay | Admitting: Family Medicine

## 2013-04-23 ENCOUNTER — Ambulatory Visit: Payer: Self-pay | Admitting: Hematology and Oncology

## 2013-05-02 ENCOUNTER — Ambulatory Visit: Payer: Self-pay | Admitting: Hematology and Oncology

## 2013-06-21 ENCOUNTER — Ambulatory Visit: Payer: Self-pay | Admitting: Hematology and Oncology

## 2013-06-22 ENCOUNTER — Ambulatory Visit: Payer: Self-pay | Admitting: Hematology and Oncology

## 2013-06-22 LAB — COMPREHENSIVE METABOLIC PANEL
ALBUMIN: 3.9 g/dL (ref 3.4–5.0)
ANION GAP: 6 — AB (ref 7–16)
Alkaline Phosphatase: 87 U/L
BILIRUBIN TOTAL: 0.3 mg/dL (ref 0.2–1.0)
BUN: 19 mg/dL — AB (ref 7–18)
CALCIUM: 11.2 mg/dL — AB (ref 8.5–10.1)
CO2: 31 mmol/L (ref 21–32)
CREATININE: 1.21 mg/dL (ref 0.60–1.30)
Chloride: 103 mmol/L (ref 98–107)
EGFR (African American): >60
EGFR (Non-African Amer.): 58 — ABNORMAL LOW
GLUCOSE: 117 mg/dL — AB (ref 65–99)
Osmolality: 283 (ref 275–301)
Potassium: 3.7 mmol/L (ref 3.5–5.1)
SGOT(AST): 16 U/L (ref 15–37)
SGPT (ALT): 19 U/L (ref 12–78)
SODIUM: 140 mmol/L (ref 136–145)
Total Protein: 7.5 g/dL (ref 6.4–8.2)

## 2013-06-22 LAB — CBC CANCER CENTER
BASOS PCT: 0.9 %
Basophil #: 0.1 x10 3/mm (ref 0.0–0.1)
Eosinophil #: 0.3 x10 3/mm (ref 0.0–0.7)
Eosinophil %: 4.7 %
HCT: 39.5 % — AB (ref 40.0–52.0)
HGB: 12.7 g/dL — AB (ref 13.0–18.0)
Lymphocyte #: 2.2 x10 3/mm (ref 1.0–3.6)
Lymphocyte %: 34.4 %
MCH: 26.9 pg (ref 26.0–34.0)
MCHC: 32.2 g/dL (ref 32.0–36.0)
MCV: 84 fL (ref 80–100)
MONO ABS: 0.4 x10 3/mm (ref 0.2–1.0)
MONOS PCT: 6.2 %
NEUTROS ABS: 3.4 x10 3/mm (ref 1.4–6.5)
NEUTROS PCT: 53.8 %
PLATELETS: 272 x10 3/mm (ref 150–440)
RBC: 4.73 10*6/uL (ref 4.40–5.90)
RDW: 15.9 % — ABNORMAL HIGH (ref 11.5–14.5)
WBC: 6.4 x10 3/mm (ref 3.8–10.6)

## 2013-06-23 LAB — PSA: PSA: 7.9 ng/mL — AB (ref 0.0–4.0)

## 2013-06-30 ENCOUNTER — Ambulatory Visit: Payer: Self-pay | Admitting: Hematology and Oncology

## 2015-06-08 ENCOUNTER — Encounter: Payer: Self-pay | Admitting: *Deleted

## 2016-12-10 DIAGNOSIS — M4327 Fusion of spine, lumbosacral region: Secondary | ICD-10-CM | POA: Insufficient documentation

## 2016-12-11 DIAGNOSIS — I480 Paroxysmal atrial fibrillation: Secondary | ICD-10-CM | POA: Insufficient documentation

## 2017-01-02 ENCOUNTER — Encounter: Payer: Self-pay | Admitting: Oncology

## 2017-01-02 ENCOUNTER — Inpatient Hospital Stay: Payer: Medicare Other

## 2017-01-02 ENCOUNTER — Inpatient Hospital Stay: Payer: Medicare Other | Attending: Oncology | Admitting: Oncology

## 2017-01-02 VITALS — BP 134/78 | HR 61 | Temp 97.8°F | Resp 16 | Wt 173.0 lb

## 2017-01-02 DIAGNOSIS — Z9079 Acquired absence of other genital organ(s): Secondary | ICD-10-CM | POA: Diagnosis not present

## 2017-01-02 DIAGNOSIS — I10 Essential (primary) hypertension: Secondary | ICD-10-CM | POA: Insufficient documentation

## 2017-01-02 DIAGNOSIS — C61 Malignant neoplasm of prostate: Secondary | ICD-10-CM | POA: Diagnosis not present

## 2017-01-02 DIAGNOSIS — I2510 Atherosclerotic heart disease of native coronary artery without angina pectoris: Secondary | ICD-10-CM

## 2017-01-02 DIAGNOSIS — Z8781 Personal history of (healed) traumatic fracture: Secondary | ICD-10-CM | POA: Insufficient documentation

## 2017-01-02 DIAGNOSIS — Z79899 Other long term (current) drug therapy: Secondary | ICD-10-CM

## 2017-01-02 DIAGNOSIS — M545 Low back pain: Secondary | ICD-10-CM | POA: Insufficient documentation

## 2017-01-02 DIAGNOSIS — Z23 Encounter for immunization: Secondary | ICD-10-CM | POA: Diagnosis not present

## 2017-01-02 DIAGNOSIS — M48061 Spinal stenosis, lumbar region without neurogenic claudication: Secondary | ICD-10-CM | POA: Insufficient documentation

## 2017-01-02 DIAGNOSIS — I252 Old myocardial infarction: Secondary | ICD-10-CM | POA: Insufficient documentation

## 2017-01-02 DIAGNOSIS — Z7982 Long term (current) use of aspirin: Secondary | ICD-10-CM

## 2017-01-02 DIAGNOSIS — C7951 Secondary malignant neoplasm of bone: Secondary | ICD-10-CM | POA: Insufficient documentation

## 2017-01-02 DIAGNOSIS — Z79818 Long term (current) use of other agents affecting estrogen receptors and estrogen levels: Secondary | ICD-10-CM | POA: Insufficient documentation

## 2017-01-02 DIAGNOSIS — E785 Hyperlipidemia, unspecified: Secondary | ICD-10-CM | POA: Insufficient documentation

## 2017-01-02 LAB — CBC WITH DIFFERENTIAL/PLATELET
Basophils Absolute: 0.1 10*3/uL (ref 0–0.1)
Basophils Relative: 1 %
EOS ABS: 0.2 10*3/uL (ref 0–0.7)
EOS PCT: 3 %
HCT: 32.9 % — ABNORMAL LOW (ref 40.0–52.0)
Hemoglobin: 11.2 g/dL — ABNORMAL LOW (ref 13.0–18.0)
LYMPHS ABS: 1.7 10*3/uL (ref 1.0–3.6)
LYMPHS PCT: 28 %
MCH: 28.6 pg (ref 26.0–34.0)
MCHC: 33.9 g/dL (ref 32.0–36.0)
MCV: 84.4 fL (ref 80.0–100.0)
MONOS PCT: 7 %
Monocytes Absolute: 0.4 10*3/uL (ref 0.2–1.0)
Neutro Abs: 3.7 10*3/uL (ref 1.4–6.5)
Neutrophils Relative %: 61 %
PLATELETS: 358 10*3/uL (ref 150–440)
RBC: 3.9 MIL/uL — AB (ref 4.40–5.90)
RDW: 14.6 % — ABNORMAL HIGH (ref 11.5–14.5)
WBC: 6 10*3/uL (ref 3.8–10.6)

## 2017-01-02 LAB — COMPREHENSIVE METABOLIC PANEL
ALBUMIN: 3.6 g/dL (ref 3.5–5.0)
ALK PHOS: 159 U/L — AB (ref 38–126)
ALT: 15 U/L — ABNORMAL LOW (ref 17–63)
AST: 16 U/L (ref 15–41)
Anion gap: 8 (ref 5–15)
BUN: 18 mg/dL (ref 6–20)
CALCIUM: 10.8 mg/dL — AB (ref 8.9–10.3)
CHLORIDE: 103 mmol/L (ref 101–111)
CO2: 26 mmol/L (ref 22–32)
CREATININE: 0.87 mg/dL (ref 0.61–1.24)
GFR calc Af Amer: >60 mL/min (ref >60)
GFR calc non Af Amer: >60 mL/min (ref >60)
GLUCOSE: 98 mg/dL (ref 65–99)
Potassium: 3.8 mmol/L (ref 3.5–5.1)
SODIUM: 137 mmol/L (ref 135–145)
Total Bilirubin: 0.5 mg/dL (ref 0.3–1.2)
Total Protein: 7 g/dL (ref 6.5–8.1)

## 2017-01-02 LAB — PSA: Prostatic Specific Antigen: 252 ng/mL — ABNORMAL HIGH (ref 0.00–4.00)

## 2017-01-02 NOTE — Progress Notes (Signed)
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 Berle Mull  Date of visit: 01/02/17   History of presenting illness- patient is a 82 year old gentleman with a past medical history significant for chronic low back pain due to lumbar radiculopathy, coronary artery disease. He also has a past history of prostate cancer about 15 years ago and was treated with prostatectomy at that time. He did not receive any radiation treatment and does not remember if his PSA was subsequently monitored. PSA in March 2015 was 7.9 and in 2014 was 6.7 after prostatectomy  He was seen by Dr. Golden Pop and his primary care doctor he saw until about 2 years ago. He has had ongoing chronic back pain and has had back surgery in the past but was having worsening back pain over the last few months and was seen by Dr. Raul Del.   MRI of the lumbar spine on 11/22/2016 revealed extensive osseous metastatic disease with pathologic compression fracture of the L3 vertebral body. Posteriorly displace fragments and metastatic soft tissue result in severe canal stenosis and compression of the thecal sac at this level.  CT chest abdomen and pelvis on 11/25/2016 did not reveal any evidence of metastatic disease other than his bones  Patient had decompression surgery of his L3 vertebral body which showed:Metastatic adenocarcinoma with features consistent with primary prostatic origin. See note.  Note: The patient history of previously diagnosed prostate carcinoma is noted. Immunoperoxidase stains were performed on formalin-fixed (non-decalcified) paraffin-embedded tissue in block A3 with appropriate positive controls. The  tumor cells stain with CK8/18, PROSTATIC SPECIFIC ANTIGEN, PROSTATIC ACID PHOSPHATASE and P501S. The tumor cells do not stain with CYTOKERATIN 7, CYTOKERATIN 20, CK5/6, P40, GATA-3, CDX-2 or THYROID TRANSCRIPTION FACTOR (TTF-1). These findings are in support of the interpretation of metastatic adenocarcinoma with features consistent with primary prostatic origin.  He has been referred to [name] for further management. Patient also reports that he has hypercalcemia for which she has been referred to some other physician. Patient currently lives with his son but is independent of his ADLs. He does report pain when he bends forward  ECOG PS- 1  Pain scale- 4   Review of systems- Review of Systems  Constitutional: Negative for chills, fever, malaise/fatigue and weight loss.  HENT: Negative for congestion, ear discharge and nosebleeds.   Eyes: Negative for blurred vision.  Respiratory: Negative for cough, hemoptysis, sputum production, shortness of breath and wheezing.   Cardiovascular: Negative for chest pain, palpitations, orthopnea and claudication.  Gastrointestinal: Negative for abdominal pain, blood in stool, constipation, diarrhea, heartburn, melena, nausea and vomiting.  Genitourinary: Negative for dysuria, flank pain, frequency, hematuria and urgency.  Musculoskeletal: Negative for back pain, joint pain and myalgias.  Skin: Negative for rash.  Neurological: Negative for dizziness, tingling, focal weakness, seizures, weakness and headaches.  Endo/Heme/Allergies: Does not bruise/bleed easily.  Psychiatric/Behavioral: Negative for depression and suicidal ideas. The patient does not have insomnia.     No Known Allergies  Patient Active Problem List   Diagnosis Date Noted  . Hypercalcemia 12/11/2016  . Lumbar stenosis 03/17/2013  . Lumbar radiculopathy, acute 03/02/2013  . Essential hypertension 07/04/2011  . H/O prostate cancer 07/02/2011  . Hyperlipidemia, unspecified 07/02/2011  . ST  elevation  MI (STEMI) ([place]) 04/01/2006     Past Medical History:  Diagnosis Date  . Arthritis   . Cancer [facility])    Prostate CA approximately 11 years ago  . Hyperlipidemia   . Hypertension   . Myocardial infarction [facility])      Past Surgical History:  Procedure Laterality Date  . APPENDECTOMY    . BACK SURGERY    . CARDIAC CATHETERIZATION     stent x 1  . CHOLECYSTECTOMY    . LUMBAR LAMINECTOMY/DECOMPRESSION MICRODISCECTOMY Left 08/20/2012   Procedure: LUMBAR LAMINECTOMY/DECOMPRESSION MICRODISCECTOMY 1 LEVEL;  Surgeon: Faythe Ghee, MD;  Location: [facility];  Service: Neurosurgery;  Laterality: Left;  lumbar four-five  . PROSTATE SURGERY    . TONSILLECTOMY      Social History   Social History  . Marital status: Married    Spouse name: N/A  . Number of children: N/A  . Years of education: N/A   Occupational History  . Not on file.   Social History Main Topics  . Smoking status: Never Smoker  . Smokeless tobacco: Never Used  . Alcohol use No  . Drug use: No  . Sexual activity: Not on file   Other Topics Concern  . Not on file   Social History Narrative  . No narrative on file     History reviewed. No pertinent family history.   Current Outpatient Prescriptions:  .  aspirin EC 81 MG tablet, Take 81 mg by mouth daily., Disp: , Rfl:  .  atorvastatin (LIPITOR) 10 MG tablet, Take 10 mg by mouth daily., Disp: , Rfl:  .  cholecalciferol (VITAMIN D) 1000 units tablet, Take 2,000 Units by mouth daily., Disp: , Rfl:  .  diltiazem (CARDIZEM SR) 120 MG 12 hr capsule, Take 120 mg by mouth 2 (two) times daily., Disp: , Rfl:  .  metoprolol tartrate (LOPRESSOR) 25 MG tablet, Take 50 mg by mouth 2 (two) times daily. , Disp: , Rfl:  .  clopidogrel (PLAVIX) 75 MG tablet, Take 75 mg by mouth daily., Disp: , Rfl:  .  HYDROcodone-acetaminophen (NORCO/VICODIN) 5-325 MG per tablet, Take 1-2 tablets by mouth every 4 (four) hours as needed. (Patient not taking: Reported on  01/02/2017), Disp: 30 tablet, Rfl: 1   Physical exam:  Vitals:   01/02/17 1410  BP: 134/78  Pulse: 61  Resp: 16  Temp: 97.8 F (36.6 C)  Weight: 173 lb (78.5 kg)   Physical Exam  Constitutional: He is oriented to person, place, and time and well-developed, well-nourished, and in no distress.  He is sitting in a wheelchair and appears in no acute distress  HENT:  Head: Normocephalic and atraumatic.  Eyes: Pupils are equal, round, and reactive to light. EOM are normal.  Neck: Normal range of motion.  Cardiovascular: Normal rate, regular rhythm and normal heart sounds.   Pulmonary/Chest: Effort normal and breath sounds normal.  Abdominal: Soft. Bowel sounds are normal.  Scar of recent back surgery seen  Musculoskeletal:  Strength 5/5 in b/l LE.   Neurological: He is alert and oriented to person, place, and time.  Skin: Skin is warm and dry.       CMP Latest Ref Rng & Units 01/02/2017  Glucose 65 - 99 mg/dL 98  BUN 6 - 20 mg/dL 18  Creatinine 0.61 - 1.24 mg/dL 0.87  Sodium 135 - 145 mmol/L 137  Potassium 3.5 - 5.1 mmol/L 3.8  Chloride 101 - 111 mmol/L 103  CO2 22 - 32 mmol/L  26  Calcium 8.9 - 10.3 mg/dL 10.8(H)  Total Protein 6.5 - 8.1 g/dL 7.0  Total Bilirubin 0.3 - 1.2 mg/dL 0.5  Alkaline Phos 38 - 126 U/L 159(H)  AST 15 - 41 U/L 16  ALT 17 - 63 U/L 15(L)   CBC Latest Ref Rng & Units 01/02/2017  WBC 3.8 - 10.6 K/uL 6.0  Hemoglobin 13.0 - 18.0 g/dL 11.2(L)  Hematocrit 40.0 - 52.0 % 32.9(L)  Platelets 150 - 440 K/uL 358     Assessment and plan- Patient is a 82 y.o. male with history of prostate cancer status post prostatectomy in the remote past now presenting with stage IV metastatic disease to the bone castrate sensitive disease  Discuss natural history of prostate cancer and difference between castrate sensitive and castrate resistant phace. Given that he has evidence of prostate cancer in his bone he does have stage IV disease. At this time I will proceed with  CBC, CMP, PSA and serum testosterone level. I will also obtain a bone scan to complete staging workup.  Since we have a confirmed pathology the diagnosis of metastatic prostate cancer based on his L3 decompression surgery, I will proceed with 50 mg daily Casodex which she will take for one week. I will see him back in 1 week's time after his bone scan and start him on Lupron. Discussed risks and benefits of Casodex and Lupron including all but not limited to fatigue, hot flashes, leg swelling, gynecomastia. Patient understands and agrees to proceed as planned. Depending on the extent of his bone disease seen on bone scan he may qualify for additional treatment such as docetaxel or abiraterone which I will discuss with him in greater detail next week  Recent bone density scan from 12/23/2016 revealed osteopenia with a T score of -1.9 in his right femur. Since he has castrate sensitive disease, I will hold off on adding bisphosphonates at this time  This patient found to have hypercalcemia on his repeat workup today then I will consider multiple myeloma testing as well  I will also try to get prior records from Dr. Kallie Edward, who had ordered a PSA in 2014 and 2015, when it was found to be elevated    Total face to face encounter time for this patient visit was 45 min. >50% of the time was  spent in counseling and coordination of care.     Thank you for this kind referral and the opportunity to participate in the care of this patient   Visit Diagnosis 1. Prostate cancer metastatic to bone [facility])     Dr. Randa Evens, MD, MPH [facility] at [facility] Pager- [phone] 01/02/2017

## 2017-01-02 NOTE — Progress Notes (Signed)
Patient is here to discuss a new diagnosis of prostate cancer. He has chronic back pain from previous injury and 4 back surgeries. He declines the flu vaccine at this time, as he states he will get it in a couple of weeks at his cardiologist's office.

## 2017-01-03 ENCOUNTER — Telehealth: Payer: Self-pay | Admitting: *Deleted

## 2017-01-03 LAB — TESTOSTERONE: Testosterone: 309 ng/dL (ref 264–916)

## 2017-01-03 MED ORDER — BICALUTAMIDE 50 MG PO TABS: 50.0000 mg | ORAL_TABLET | Freq: Every day | ORAL | 0 refills | Status: DC

## 2017-01-03 NOTE — Telephone Encounter (Signed)
Notified Maria.

## 2017-01-03 NOTE — Telephone Encounter (Signed)
Carlos Perkins called and states that patient was to have a prescription for Casodex sent to pharmacy, but it was not sent. Also asking if he starts it today if that will be enough time to be on medicine before he gets his injection Please advise

## 2017-01-03 NOTE — Telephone Encounter (Signed)
I have sent it to pharmacy right now. He can still get his injection next week as planned

## 2017-01-03 NOTE — Addendum Note (Signed)
Addended by: Randa Evens C on: 01/03/2017 10:30 AM   Modules accepted: Orders

## 2017-01-09 ENCOUNTER — Ambulatory Visit
Admission: RE | Admit: 2017-01-09 | Discharge: 2017-01-09 | Disposition: A | Discharge status: Home / Self Care | Payer: Medicare Other | Source: Ambulatory Visit | Attending: Oncology | Admitting: Oncology

## 2017-01-09 ENCOUNTER — Encounter
Admission: RE | Admit: 2017-01-09 | Discharge: 2017-01-09 | Disposition: A | Discharge status: Home / Self Care | Payer: Medicare Other | Source: Ambulatory Visit | Attending: Oncology | Admitting: Oncology

## 2017-01-09 DIAGNOSIS — C7951 Secondary malignant neoplasm of bone: Principal | ICD-10-CM

## 2017-01-09 DIAGNOSIS — C61 Malignant neoplasm of prostate: Secondary | ICD-10-CM | POA: Diagnosis not present

## 2017-01-09 MED ORDER — TECHNETIUM TC 99M MEDRONATE IV KIT
25.0000 | PACK | Freq: Once | INTRAVENOUS | Status: AC | PRN
  Administered 2017-01-09: 23.43 via INTRAVENOUS

## 2017-01-14 ENCOUNTER — Inpatient Hospital Stay (HOSPITAL_BASED_OUTPATIENT_CLINIC_OR_DEPARTMENT_OTHER): Payer: Medicare Other | Admitting: Oncology

## 2017-01-14 ENCOUNTER — Encounter: Payer: Self-pay | Admitting: Oncology

## 2017-01-14 ENCOUNTER — Other Ambulatory Visit: Payer: Self-pay | Admitting: Oncology

## 2017-01-14 ENCOUNTER — Inpatient Hospital Stay: Payer: Medicare Other

## 2017-01-14 VITALS — BP 174/81 | HR 56 | Temp 97.9°F | Wt 184.0 lb

## 2017-01-14 DIAGNOSIS — Z23 Encounter for immunization: Secondary | ICD-10-CM

## 2017-01-14 DIAGNOSIS — Z9079 Acquired absence of other genital organ(s): Secondary | ICD-10-CM | POA: Diagnosis not present

## 2017-01-14 DIAGNOSIS — I10 Essential (primary) hypertension: Secondary | ICD-10-CM

## 2017-01-14 DIAGNOSIS — C7951 Secondary malignant neoplasm of bone: Principal | ICD-10-CM

## 2017-01-14 DIAGNOSIS — C61 Malignant neoplasm of prostate: Secondary | ICD-10-CM

## 2017-01-14 DIAGNOSIS — Z8781 Personal history of (healed) traumatic fracture: Secondary | ICD-10-CM | POA: Diagnosis not present

## 2017-01-14 DIAGNOSIS — I2510 Atherosclerotic heart disease of native coronary artery without angina pectoris: Secondary | ICD-10-CM | POA: Diagnosis not present

## 2017-01-14 DIAGNOSIS — M545 Low back pain: Secondary | ICD-10-CM

## 2017-01-14 DIAGNOSIS — Z7189 Other specified counseling: Secondary | ICD-10-CM

## 2017-01-14 DIAGNOSIS — Z79899 Other long term (current) drug therapy: Secondary | ICD-10-CM

## 2017-01-14 DIAGNOSIS — Z7982 Long term (current) use of aspirin: Secondary | ICD-10-CM

## 2017-01-14 DIAGNOSIS — E785 Hyperlipidemia, unspecified: Secondary | ICD-10-CM

## 2017-01-14 DIAGNOSIS — M48061 Spinal stenosis, lumbar region without neurogenic claudication: Secondary | ICD-10-CM

## 2017-01-14 DIAGNOSIS — Z79818 Long term (current) use of other agents affecting estrogen receptors and estrogen levels: Secondary | ICD-10-CM

## 2017-01-14 DIAGNOSIS — I252 Old myocardial infarction: Secondary | ICD-10-CM | POA: Diagnosis not present

## 2017-01-14 MED ORDER — INFLUENZA VAC SPLIT QUAD 0.5 ML IM SUSY
0.5000 mL | PREFILLED_SYRINGE | Freq: Once | INTRAMUSCULAR | Status: AC
  Administered 2017-01-14: 0.5 mL via INTRAMUSCULAR
  Filled 2017-01-14: qty 0.5

## 2017-01-14 MED ORDER — LEUPROLIDE ACETATE (3 MONTH) 22.5 MG IM KIT
22.5000 mg | PACK | INTRAMUSCULAR | Status: DC
  Administered 2017-01-14: 22.5 mg via INTRAMUSCULAR
  Filled 2017-01-14: qty 22.5

## 2017-01-14 NOTE — Progress Notes (Signed)
Patient here for follow up and Lupron injection. He states that his back hurts when he stands up or changes positions, but sitting still he is fine. His BP was elevated today.

## 2017-01-15 ENCOUNTER — Other Ambulatory Visit: Payer: Self-pay | Admitting: *Deleted

## 2017-01-15 ENCOUNTER — Telehealth: Payer: Self-pay | Admitting: *Deleted

## 2017-01-15 DIAGNOSIS — Z7189 Other specified counseling: Secondary | ICD-10-CM | POA: Insufficient documentation

## 2017-01-15 MED ORDER — ABIRATERONE ACETATE 250 MG PO TABS: 1000.0000 mg | ORAL_TABLET | Freq: Every day | ORAL | 0 refills | Status: DC

## 2017-01-15 MED ORDER — BICALUTAMIDE 50 MG PO TABS: 50.0000 mg | ORAL_TABLET | Freq: Every day | ORAL | 0 refills | Status: DC

## 2017-01-15 MED ORDER — PREDNISONE 5 MG PO TABS: 5.0000 mg | ORAL_TABLET | Freq: Every day | ORAL | 3 refills | Status: DC

## 2017-01-15 NOTE — Progress Notes (Deleted)
Hematology/Oncology Consult note [facility] [phone] Fax:(336) [phone]  Patient Care Team: Idelle Crouch, [place] as PCP - General (Internal Medicine)   Name of the patient: Carlos Perkins  [id number]  July 03, 1934    Reason for referral-    Referring physician- ***  Date of visit: 01/15/17   History of presenting illness- ***  ECOG PS- ***  Pain scale- ***   Review of systems- ROS  No Known Allergies  Patient Active Problem List   Diagnosis Date Noted  . Prostate cancer metastatic to bone ([place]) 01/14/2017  . Hypercalcemia 12/11/2016  . Lumbar stenosis 03/17/2013  . Lumbar radiculopathy, acute 03/02/2013  . Essential hypertension 07/04/2011  . H/O prostate cancer 07/02/2011  . Hyperlipidemia, unspecified 07/02/2011  . ST elevation MI (STEMI) ([place]) 04/01/2006     Past Medical History:  Diagnosis Date  . Arthritis   . Cancer [facility])    Prostate CA approximately 11 years ago  . Hyperlipidemia   . Hypertension   . Myocardial infarction [facility])      Past Surgical History:  Procedure Laterality Date  . APPENDECTOMY    . BACK SURGERY    . CARDIAC CATHETERIZATION     stent x 1  . CHOLECYSTECTOMY    . LUMBAR LAMINECTOMY/DECOMPRESSION MICRODISCECTOMY Left 08/20/2012   Procedure: LUMBAR LAMINECTOMY/DECOMPRESSION MICRODISCECTOMY 1 LEVEL;  Surgeon: Faythe Ghee, MD;  Location: [facility];  Service: Neurosurgery;  Laterality: Left;  lumbar four-five  . PROSTATE SURGERY    . TONSILLECTOMY      Social History   Social History  . Marital status: Married    Spouse name: N/A  . Number of children: N/A  . Years of education: N/A   Occupational History  . Not on file.   Social History Main Topics  . Smoking status: Never Smoker  . Smokeless tobacco: Never Used  . Alcohol use No  . Drug use: No  . Sexual activity: Not on file   Other Topics Concern  . Not on file   Social History Narrative  . No narrative on file       History reviewed. No pertinent family history.   Current Outpatient Prescriptions:  .  aspirin EC 81 MG tablet, Take 81 mg by mouth daily., Disp: , Rfl:  .  atorvastatin (LIPITOR) 10 MG tablet, Take 10 mg by mouth daily., Disp: , Rfl:  .  bicalutamide (CASODEX) 50 MG tablet, Take 1 tablet (50 mg total) by mouth daily., Disp: 7 tablet, Rfl: 0 .  cholecalciferol (VITAMIN D) 1000 units tablet, Take 2,000 Units by mouth daily., Disp: , Rfl:  .  diltiazem (CARDIZEM SR) 120 MG 12 hr capsule, Take 120 mg by mouth daily. , Disp: , Rfl:  .  metoprolol tartrate (LOPRESSOR) 25 MG tablet, Take 50 mg by mouth 2 (two) times daily. , Disp: , Rfl:  .  clopidogrel (PLAVIX) 75 MG tablet, Take 75 mg by mouth daily., Disp: , Rfl:  .  HYDROcodone-acetaminophen (NORCO/VICODIN) 5-325 MG per tablet, Take 1-2 tablets by mouth every 4 (four) hours as needed. (Patient not taking: Reported on 01/02/2017), Disp: 30 tablet, Rfl: 1   Physical exam:  Vitals:   01/14/17 1417  BP: (!) 174/81  Pulse: (!) 56  Temp: 97.9 F (36.6 C)  TempSrc: Tympanic  Weight: 184 lb (83.5 kg)   Physical Exam     CMP Latest Ref Rng & Units 01/02/2017  Glucose 65 - 99 mg/dL 98  BUN 6 -  20 mg/dL 18  Creatinine 0.61 - 1.24 mg/dL 0.87  Sodium 135 - 145 mmol/L 137  Potassium 3.5 - 5.1 mmol/L 3.8  Chloride 101 - 111 mmol/L 103  CO2 22 - 32 mmol/L 26  Calcium 8.9 - 10.3 mg/dL 10.8(H)  Total Protein 6.5 - 8.1 g/dL 7.0  Total Bilirubin 0.3 - 1.2 mg/dL 0.5  Alkaline Phos 38 - 126 U/L 159(H)  AST 15 - 41 U/L 16  ALT 17 - 63 U/L 15(L)   CBC Latest Ref Rng & Units 01/02/2017  WBC 3.8 - 10.6 K/uL 6.0  Hemoglobin 13.0 - 18.0 g/dL 11.2(L)  Hematocrit 40.0 - 52.0 % 32.9(L)  Platelets 150 - 440 K/uL 358    No images are attached to the encounter.  Nm Bone Scan Whole Body  Result Date: 01/09/2017 CLINICAL DATA:  82 year old male with prostate cancer and osseous metastatic disease. 4 prior back surgeries most recent 12/10/2016.  Bilateral hip pain. Prostatectomy 15 years ago. Initial encounter. EXAM: NUCLEAR MEDICINE WHOLE BODY BONE SCAN TECHNIQUE: Whole body anterior and posterior images were obtained approximately 3 hours after intravenous injection of radiopharmaceutical. RADIOPHARMACEUTICALS:  62.56 millicuries mCi [id number] MDP IV COMPARISON:  06/21/2013 CT.  09/21/2012 bone scan. FINDINGS: Progressive radiotracer uptake suggesting progressive osseous metastatic lesions involving portions of the cervical spine, thoracic spine, ribs bilaterally, right humeral neck, sternum, sacrum, left iliac wing, left acetabulum and right ischium. Evaluation of the lumbar region is limited as radiotracer uptake in this region could be explained by metastatic disease and/or or postsurgical changes. Increased uptake in the knees bilaterally and feet bilaterally suggestive of degenerative changes. Both kidneys visualized. IMPRESSION: Progressive osseous metastatic disease as detailed above. Electronically Signed   By: Genia Del M.D.   On: 01/09/2017 14:25    Assessment and plan- Patient is a 82 y.o. male ***   Thank you for this kind referral and the opportunity to participate in the care of this  Patient   Visit Diagnosis No diagnosis found.  Dr. Randa Evens, MD, MPH [place] at [facility] Pager- [phone] 01/15/2017

## 2017-01-15 NOTE — Progress Notes (Signed)
START ON PATHWAY REGIMEN - Prostate   Leuprolide 22.5 mg q3 months:   A cycle is every 3 months:     Leuprolide acetate depot   **Always confirm dose/schedule in your pharmacy ordering system**    Abiraterone 1000 mg Daily + Prednisone 5 mg Daily:   Daily:     Abiraterone acetate      Prednisone   **Always confirm dose/schedule in your pharmacy ordering system**    Patient Characteristics: Adenocarcinoma, Metastatic, Hormone Naive, High Volume Disease* Current radiographic evidence of distant metastasis<= Yes Histology: Adenocarcinoma AJCC T Category: Staged < 8th Ed. Gleason Primary: Staged < 8th Ed. AJCC N Category: Staged < 8th Ed. Gleason Secondary: Staged < 8th Ed. AJCC M Category: M1b Gleason Score: X AJCC 8 Stage Grouping: IVB PSA Values (ng/mL): ? 20 Would you be surprised if this patient died  in the next year<= I would be surprised if this patient died in the next year Intent of Therapy: Non-Curative / Palliative Intent, Discussed with Patient

## 2017-01-15 NOTE — Progress Notes (Signed)
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 He also has a past history of prostate cancer about 15 years ago (Gleasons score 8) and was treated with prostatectomy at that time. He did not receive any radiation treatment and does not remember if his PSA was subsequently monitored. PSA in March 2015 was 7.9 and in 2014 was 6.7 after prostatectomy. He was last seen by medical oncology Dr. Everlene Other in 2015 and at that time his PSA was 9.2 previously was supposed to follow-up after 2 months and ADT was supposed to be started at that time the patient was lost to follow-up.  He was seen by Dr. Golden Pop and his primary care doctor he saw until about 2 years ago. He has had ongoing chronic back pain and has had back surgery in the past but was having worsening back pain over the last few months and was seen by Dr. Raul Del.   MRI of the lumbar spine on 11/22/2016 revealed extensive osseous metastatic disease with pathologic compression fracture of the L3 vertebral body. Posteriorly displace fragments and metastatic soft tissue result in severe canal stenosis and compression of the thecal sac at this level.  CT chest abdomen and pelvis on 11/25/2016 did not reveal any evidence of metastatic disease other than his bones  Patient had decompression surgery of his L3 vertebral body which showed:Metastatic adenocarcinoma with features  consistent with primary prostatic origin. See note.  Note: The patient history of previously diagnosed prostate carcinoma is noted. Immunoperoxidase stains were performed on formalin-fixed (non-decalcified) paraffin-embedded tissue in block A3 with appropriate positive controls. The tumor cells stain with CK8/18, PROSTATIC SPECIFIC ANTIGEN, PROSTATIC ACID PHOSPHATASE and P501S. The tumor cells do not stain with CYTOKERATIN 7, CYTOKERATIN 20, CK5/6, P40, GATA-3, CDX-2 or THYROID TRANSCRIPTION FACTOR (TTF-1). These findings are in support of the interpretation of metastatic adenocarcinoma with features consistent with primary prostatic origin.  He has been referred to [name] for further management. Patient also reports that he has hypercalcemia for which she has been referred to some other physician. Patient currently lives with his son but is independent of his ADLs. He does report pain when he bends forward  Interval history- continues to have back pain and spends most of his day reclining in bed due to pain. He is able to ambulate with a walker  ECOG PS- 2 Pain scale- 2 Opioid associated constipation- no  Review of systems- Review of Systems  Constitutional: Positive for malaise/fatigue. Negative for chills, fever and weight loss.  HENT: Negative for congestion, ear discharge and nosebleeds.   Eyes: Negative for blurred vision.  Respiratory: Negative for cough, hemoptysis, sputum production, shortness of breath and wheezing.   Cardiovascular: Negative for chest pain, palpitations, orthopnea and claudication.  Gastrointestinal: Negative for abdominal pain, blood in stool, constipation, diarrhea, heartburn, melena, nausea and vomiting.  Genitourinary: Negative for dysuria, flank pain, frequency, hematuria and urgency.  Musculoskeletal: Positive for back pain. Negative for joint pain and myalgias.  Skin:  Negative for rash.  Neurological: Negative for dizziness, tingling, focal weakness, seizures,  weakness and headaches.  Endo/Heme/Allergies: Does not bruise/bleed easily.  Psychiatric/Behavioral: Negative for depression and suicidal ideas. The patient does not have insomnia.     No Known Allergies   Past Medical History:  Diagnosis Date  . Arthritis   . Cancer [facility])    Prostate CA approximately 11 years ago  . Hyperlipidemia   . Hypertension   . Myocardial infarction [facility])      Past Surgical History:  Procedure Laterality Date  . APPENDECTOMY    . BACK SURGERY    . CARDIAC CATHETERIZATION     stent x 1  . CHOLECYSTECTOMY    . LUMBAR LAMINECTOMY/DECOMPRESSION MICRODISCECTOMY Left 08/20/2012   Procedure: LUMBAR LAMINECTOMY/DECOMPRESSION MICRODISCECTOMY 1 LEVEL;  Surgeon: Faythe Ghee, MD;  Location: [facility];  Service: Neurosurgery;  Laterality: Left;  lumbar four-five  . PROSTATE SURGERY    . TONSILLECTOMY      Social History   Social History  . Marital status: Married    Spouse name: N/A  . Number of children: N/A  . Years of education: N/A   Occupational History  . Not on file.   Social History Main Topics  . Smoking status: Never Smoker  . Smokeless tobacco: Never Used  . Alcohol use No  . Drug use: No  . Sexual activity: Not on file   Other Topics Concern  . Not on file   Social History Narrative  . No narrative on file    History reviewed. No pertinent family history.   Current Outpatient Prescriptions:  .  aspirin EC 81 MG tablet, Take 81 mg by mouth daily., Disp: , Rfl:  .  atorvastatin (LIPITOR) 10 MG tablet, Take 10 mg by mouth daily., Disp: , Rfl:  .  bicalutamide (CASODEX) 50 MG tablet, Take 1 tablet (50 mg total) by mouth daily., Disp: 7 tablet, Rfl: 0 .  cholecalciferol (VITAMIN D) 1000 units tablet, Take 2,000 Units by mouth daily., Disp: , Rfl:  .  diltiazem (CARDIZEM SR) 120 MG 12 hr capsule, Take 120 mg by mouth daily. , Disp: , Rfl:  .  metoprolol tartrate (LOPRESSOR) 25 MG tablet, Take 50 mg by mouth 2 (two) times daily.  , Disp: , Rfl:  .  clopidogrel (PLAVIX) 75 MG tablet, Take 75 mg by mouth daily., Disp: , Rfl:  .  HYDROcodone-acetaminophen (NORCO/VICODIN) 5-325 MG per tablet, Take 1-2 tablets by mouth every 4 (four) hours as needed. (Patient not taking: Reported on 01/02/2017), Disp: 30 tablet, Rfl: 1  Physical exam:  Vitals:   01/14/17 1417  BP: (!) 174/81  Pulse: (!) 56  Temp: 97.9 F (36.6 C)  TempSrc: Tympanic  Weight: 184 lb (83.5 kg)   Physical Exam  Constitutional: He is oriented to person, place, and time.  Sitting in a wheelchair. appears in no acute distress  HENT:  Head: Normocephalic and atraumatic.  Eyes: Pupils are equal, round, and reactive to light. EOM are normal.  Neck: Normal range of motion.  Cardiovascular: Normal rate, regular rhythm and normal heart sounds.   Pulmonary/Chest: Effort normal and breath sounds normal.  Abdominal: Soft. Bowel sounds are normal.  Neurological: He is alert and oriented to person, place, and time.  Skin: Skin is warm and dry.     CMP Latest Ref Rng & Units 01/02/2017  Glucose 65 - 99 mg/dL 98  BUN 6 - 20 mg/dL 18  Creatinine 0.61 - 1.24 mg/dL  0.87  Sodium 135 - 145 mmol/L 137  Potassium 3.5 - 5.1 mmol/L 3.8  Chloride 101 - 111 mmol/L 103  CO2 22 - 32 mmol/L 26  Calcium 8.9 - 10.3 mg/dL 10.8(H)  Total Protein 6.5 - 8.1 g/dL 7.0  Total Bilirubin 0.3 - 1.2 mg/dL 0.5  Alkaline Phos 38 - 126 U/L 159(H)  AST 15 - 41 U/L 16  ALT 17 - 63 U/L 15(L)   CBC Latest Ref Rng & Units 01/02/2017  WBC 3.8 - 10.6 K/uL 6.0  Hemoglobin 13.0 - 18.0 g/dL 11.2(L)  Hematocrit 40.0 - 52.0 % 32.9(L)  Platelets 150 - 440 K/uL 358    No images are attached to the encounter.  Nm Bone Scan Whole Body  Result Date: 01/09/2017 CLINICAL DATA:  82 year old male with prostate cancer and osseous metastatic disease. 4 prior back surgeries most recent 12/10/2016. Bilateral hip pain. Prostatectomy 15 years ago. Initial encounter. EXAM: NUCLEAR MEDICINE WHOLE BODY BONE  SCAN TECHNIQUE: Whole body anterior and posterior images were obtained approximately 3 hours after intravenous injection of radiopharmaceutical. RADIOPHARMACEUTICALS:  02.72 millicuries mCi [id number] MDP IV COMPARISON:  06/21/2013 CT.  09/21/2012 bone scan. FINDINGS: Progressive radiotracer uptake suggesting progressive osseous metastatic lesions involving portions of the cervical spine, thoracic spine, ribs bilaterally, right humeral neck, sternum, sacrum, left iliac wing, left acetabulum and right ischium. Evaluation of the lumbar region is limited as radiotracer uptake in this region could be explained by metastatic disease and/or or postsurgical changes. Increased uptake in the knees bilaterally and feet bilaterally suggestive of degenerative changes. Both kidneys visualized. IMPRESSION: Progressive osseous metastatic disease as detailed above. Electronically Signed   By: Genia Del M.D.   On: 01/09/2017 14:25     Assessment and plan- Patient is a 82 y.o. male with high risk castrate sensitive prostate cancer with bone only mets  I personally reviewed his bone scan images and discuss the results of the scan with the patient. Patient does have extensive bone metastases mainly in his spine in his pelvis but also bilateral ribs, right humeral neck sternal and acetabular mets. This does constitute high-volume disease and given that his Gleason score in the past was 8, he does meet criteria for ADT plus additional treatment such as docetaxel or abiraterone.   I discussed the findings of both LATITUDE trial as well as STAMPEDE trial with the patient. Latitude trial looked at high risk prostate cancer patients and compared ADT versus ADT plus abiraterone. Progression free survival and overall survival was significantly better in the abiraterone group. STAMPEDE trial had about 29% of patients with high risk prostate cancer such as his with a PSA of greater than 40 and a Gleason score of greater than 8.  This trial compared ADD versus ADT plus docetaxel and again progression free survival and overall survival was significantly better in the docetaxel group. We do not have a head-to-head data between docetaxel and Abiraterone. I explained the risks and benefits of Abiraterone including all but not limited to worsening cardiovascular side effects of chest hypertension and hyperlipidemia, fatigue edema as well as risk of liver dysfunction. Risks and benefits of docetaxel including all but not limited to nausea, vomiting, low blood counts, risk of infections and better from neuropathy.   At this point patient is just recovering from his back surgery and still spends most of the time in bed due to back pain. I will therefore proceed with ADT alone and work on [employer] authorization for abiraterone. He will continue  casodex for 4 more weeks. He will get his 1st dose of lupron Q3 months today. No role for bisphosphonates in castrate sensitive disease  I will also send off foundation one testing on his bone biopsy specimen  I will see him back in 1 month with cbc, cmp, PSA, testosterone level. If his performance status is improved and he is tolerating Lupron well, I will proceed with abiraterone at that time. Docetaxel has a finite duration of treatment but will likely be harder to tolerate as compared to abiraterone which will be continued until progression. Treatment will be given with a palliative intent. Patient understands and agrees to proceed as planned    Total face to face encounter time for this patient visit was 45 min. >50% of the time was  spent in counseling and coordination of care.      Visit Diagnosis 1. Prostate cancer metastatic to bone ([place])   2. Goals of care, counseling/discussion   3. Androgen deprivation therapy      Dr. Randa Evens, MD, MPH [facility] Pager- [phone] 01/15/2017 3:23 PM

## 2017-01-15 NOTE — Telephone Encounter (Signed)
Per Dr. Janese Banks pt needs to stay on casodex for 1 month.  Patient took the 7 days but he is out. I will send him new rx and he will have daughter pick it up tomorrow and start it.

## 2017-01-16 ENCOUNTER — Telehealth: Payer: Self-pay | Admitting: Pharmacist

## 2017-01-16 DIAGNOSIS — C7951 Secondary malignant neoplasm of bone: Principal | ICD-10-CM

## 2017-01-16 DIAGNOSIS — C61 Malignant neoplasm of prostate: Secondary | ICD-10-CM

## 2017-01-16 MED ORDER — ABIRATERONE ACETATE 250 MG PO TABS
1000.0000 mg | ORAL_TABLET | Freq: Every day | ORAL | 0 refills | Status: DC
Start: 2017-01-16 — End: 2017-03-11

## 2017-01-16 MED ORDER — PREDNISONE 5 MG PO TABS: 5.0000 mg | ORAL_TABLET | Freq: Every day | ORAL | 3 refills | Status: DC

## 2017-01-16 NOTE — Telephone Encounter (Signed)
Oral Oncology Pharmacist Encounter  Received new prescription for Zytiga for the treatment of high risk castrate sensitive prostate cancer in conjunction with prednisone, planned duration until disease progression or unacceptable drug toxicity.  CMP from 01/02/17 and BP from 01/15/17 assessed, no relevant lab abnormalities but Carlos Perkins's BP was elevated it was not elevated at his previous visit. Will continue to monitor. The plan is for him to not start therapy for another month. Prescription dose and frequency assessed.   Current medication list in Epic reviewed, one DDIs with metoprolol identified.  - Zytiga may increase the concentration of metoprolol and Carlos Perkins's HR should be followed.  Prescription has been e-scribed to the [facility] for benefits analysis and approval.  Oral Oncology Clinic will continue to follow for insurance authorization, copayment issues, initial counseling and start date.  Darl Pikes, PharmD, BCPS Hematology/Oncology Clinical Pharmacist [facility] [phone]  01/16/2017 12:12 PM

## 2017-01-16 NOTE — Telephone Encounter (Signed)
Oral Chemotherapy Pharmacist Encounter  Patient has no prescription insurance to cover the cost of the Zytiga. He needs to apply for manufacturer assistance to access the medication.   Called Mr. Dymond to inform him of this. He is willing to come in to bring be a his 1040 tax form and to sign the [employer] patient assistance application. He will give me a call to let me know when he plans to come in.    Darl Pikes, PharmD, BCPS Hematology/Oncology Clinical Pharmacist [facility] [phone]  01/16/2017 2:47 PM

## 2017-01-20 NOTE — Telephone Encounter (Signed)
Oral Oncology Patient Advocate Encounter  Met patient in lobby to complete application for [facility] Wynetta Emery in an effort to reduce patient's out of pocket expense for Zytiga to $0.    Application completed and faxed to [phone] with patients 1040B taxes. Patient no longer able to work letter was also sent. Per [employer] no drug out of pocket expense was needed.  Patient assistance phone number for follow up is [phone].   This encounter will be updated until final determination.   [place] Patient Advocate [phone] 01/20/2017 11:56 AM

## 2017-01-27 NOTE — Telephone Encounter (Signed)
Oral Oncology Patient Advocate Encounter   Called Carlos Perkins and Carlos Perkins to check on application for patient. It was denied. I told them that he is no long er able to work and his income is different this year. They found letter and attached to application and sent it back to see if they can get it approved Trisha P. Said to call back on Friday 01/31/2017.   Carlos Perkins Patient Advocate [phone] 01/27/2017 9:54 AM

## 2017-01-31 NOTE — Telephone Encounter (Signed)
Oral Oncology Patient Advocate Encounter   Called [place] to find out the status of Mr. Abaya's application. Per Missy R. There system is down and to please call back on Monday. The letter of hardship was sent to the process center.   Called patient to let him know that I am still working on getting his application for Zytiga approved even though they denied the first time due to his income. I had sent a hardship letter stating  The patient is no longer able to work.   [place] Patient Advocate [phone] 01/31/2017 10:48 AM

## 2017-02-04 ENCOUNTER — Telehealth: Payer: Self-pay | Admitting: Oncology

## 2017-02-04 NOTE — Telephone Encounter (Signed)
Oral Oncology Patient Advocate Encounter  Received notification from [place] Patient [facility] that patient has been denied enrollment into their program to receive Zytiga from the drug manufacturer.     We are trying to appeal. Patient no longer able to work. He is getting a letter from his past employer that shows he had to quite due to his health. No longer able to do the job.   Patient knows to call the office with questions or concerns.  Oral Oncology Clinic will continue to follow.  [place] Patient Advocate [phone] 02/04/2017 10:34 AM

## 2017-02-04 NOTE — Telephone Encounter (Signed)
Oral Oncology Patient Advocate Encounter   Faxed over letter from previous employer stating patient no longer work there. Wrote a note that patient no longer has the income of $ 34,320.00. Only has his ss.    [place] Patient Advocate [phone] 02/04/2017 12:33 PM

## 2017-02-13 ENCOUNTER — Telehealth: Payer: Self-pay | Admitting: Oncology

## 2017-02-13 NOTE — Telephone Encounter (Signed)
Oral Oncology Patient Advocate Encounter  Received notification from [facility] Patient Assistance program that patient has been successfully enrolled into their program to receive Zytiga  from the manufacturer at $0 out of pocket until 02/11/2018.   I called and spoke with patient.  He knows we will have to re-apply.   Patient knows to call the office with questions or concerns.  Oral Oncology Clinic will continue to follow.   [place] Patient Advocate [phone] 02/13/2017 12:50 PM

## 2017-02-14 ENCOUNTER — Inpatient Hospital Stay: Payer: Medicare Other | Attending: Oncology | Admitting: Oncology

## 2017-02-14 ENCOUNTER — Inpatient Hospital Stay: Payer: Medicare Other

## 2017-02-14 ENCOUNTER — Encounter: Payer: Self-pay | Admitting: Oncology

## 2017-02-14 ENCOUNTER — Telehealth: Payer: Self-pay | Admitting: Pharmacist

## 2017-02-14 ENCOUNTER — Other Ambulatory Visit: Payer: Self-pay

## 2017-02-14 ENCOUNTER — Telehealth: Payer: Self-pay | Admitting: *Deleted

## 2017-02-14 VITALS — BP 140/78 | HR 53 | Temp 95.7°F | Resp 18 | Wt 175.5 lb

## 2017-02-14 DIAGNOSIS — I252 Old myocardial infarction: Secondary | ICD-10-CM | POA: Insufficient documentation

## 2017-02-14 DIAGNOSIS — C61 Malignant neoplasm of prostate: Secondary | ICD-10-CM | POA: Diagnosis not present

## 2017-02-14 DIAGNOSIS — M129 Arthropathy, unspecified: Secondary | ICD-10-CM | POA: Diagnosis not present

## 2017-02-14 DIAGNOSIS — C7951 Secondary malignant neoplasm of bone: Secondary | ICD-10-CM | POA: Insufficient documentation

## 2017-02-14 DIAGNOSIS — I10 Essential (primary) hypertension: Secondary | ICD-10-CM | POA: Insufficient documentation

## 2017-02-14 DIAGNOSIS — M5416 Radiculopathy, lumbar region: Secondary | ICD-10-CM | POA: Diagnosis not present

## 2017-02-14 DIAGNOSIS — Z79818 Long term (current) use of other agents affecting estrogen receptors and estrogen levels: Secondary | ICD-10-CM

## 2017-02-14 DIAGNOSIS — M8580 Other specified disorders of bone density and structure, unspecified site: Secondary | ICD-10-CM | POA: Diagnosis not present

## 2017-02-14 DIAGNOSIS — I2510 Atherosclerotic heart disease of native coronary artery without angina pectoris: Secondary | ICD-10-CM | POA: Diagnosis not present

## 2017-02-14 DIAGNOSIS — M545 Low back pain: Secondary | ICD-10-CM | POA: Insufficient documentation

## 2017-02-14 DIAGNOSIS — Z7189 Other specified counseling: Secondary | ICD-10-CM

## 2017-02-14 DIAGNOSIS — Z79899 Other long term (current) drug therapy: Secondary | ICD-10-CM | POA: Insufficient documentation

## 2017-02-14 DIAGNOSIS — Z7902 Long term (current) use of antithrombotics/antiplatelets: Secondary | ICD-10-CM | POA: Diagnosis not present

## 2017-02-14 DIAGNOSIS — E785 Hyperlipidemia, unspecified: Secondary | ICD-10-CM | POA: Diagnosis not present

## 2017-02-14 DIAGNOSIS — Z7982 Long term (current) use of aspirin: Secondary | ICD-10-CM | POA: Diagnosis not present

## 2017-02-14 DIAGNOSIS — IMO0001 Reserved for inherently not codable concepts without codable children: Secondary | ICD-10-CM

## 2017-02-14 LAB — CBC WITH DIFFERENTIAL/PLATELET
BASOS PCT: 1 %
Basophils Absolute: 0.1 10*3/uL (ref 0–0.1)
EOS ABS: 0.1 10*3/uL (ref 0–0.7)
EOS PCT: 1 %
HCT: 40.9 % (ref 40.0–52.0)
Hemoglobin: 13.4 g/dL (ref 13.0–18.0)
LYMPHS ABS: 2.7 10*3/uL (ref 1.0–3.6)
Lymphocytes Relative: 23 %
MCH: 27 pg (ref 26.0–34.0)
MCHC: 32.7 g/dL (ref 32.0–36.0)
MCV: 82.5 fL (ref 80.0–100.0)
MONO ABS: 1 10*3/uL (ref 0.2–1.0)
MONOS PCT: 9 %
Neutro Abs: 7.9 10*3/uL — ABNORMAL HIGH (ref 1.4–6.5)
Neutrophils Relative %: 66 %
PLATELETS: 355 10*3/uL (ref 150–440)
RBC: 4.96 MIL/uL (ref 4.40–5.90)
RDW: 15.8 % — AB (ref 11.5–14.5)
WBC: 11.8 10*3/uL — AB (ref 3.8–10.6)

## 2017-02-14 LAB — PSA: Prostatic Specific Antigen: 20.15 ng/mL — ABNORMAL HIGH (ref 0.00–4.00)

## 2017-02-14 LAB — COMPREHENSIVE METABOLIC PANEL
ALBUMIN: 3.8 g/dL (ref 3.5–5.0)
ALK PHOS: 107 U/L (ref 38–126)
ALT: 16 U/L — AB (ref 17–63)
AST: 16 U/L (ref 15–41)
Anion gap: 6 (ref 5–15)
BILIRUBIN TOTAL: 0.5 mg/dL (ref 0.3–1.2)
BUN: 40 mg/dL — AB (ref 6–20)
CALCIUM: 11.8 mg/dL — AB (ref 8.9–10.3)
CO2: 28 mmol/L (ref 22–32)
CREATININE: 1.15 mg/dL (ref 0.61–1.24)
Chloride: 99 mmol/L — ABNORMAL LOW (ref 101–111)
GFR calc Af Amer: >60 mL/min (ref >60)
GFR, EST NON AFRICAN AMERICAN: 58 mL/min — AB (ref >60)
GLUCOSE: 106 mg/dL — AB (ref 65–99)
Potassium: 3.9 mmol/L (ref 3.5–5.1)
Sodium: 133 mmol/L — ABNORMAL LOW (ref 135–145)
TOTAL PROTEIN: 7.6 g/dL (ref 6.5–8.1)

## 2017-02-14 MED ORDER — PREDNISONE 5 MG PO TABS: 5.0000 mg | ORAL_TABLET | Freq: Every day | ORAL | 3 refills | Status: DC

## 2017-02-14 NOTE — Progress Notes (Signed)
Hematology/Oncology Consult note 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 He also has a past history of prostate cancer about 15 years ago (Gleasons score 8) and was treated with prostatectomy at that time. He did not receive any radiation treatment and does not remember if his PSA was subsequently monitored. PSA in March 2015 was 7.9 and in 2014 was 6.7 after prostatectomy. He was last seen by medical oncology Dr. Everlene Perkins in 2015 and at that time his PSA was 9.2 previously was supposed to follow-up after 2 months and ADT was supposed to be started at that time the patient was lost to follow-up.  He was seen by Dr. Golden Perkins and his primary care doctor he saw until about 2 years ago. He has had ongoing chronic back pain and has had back surgery in the past but was having worsening back pain over the last few months and was seen by Dr. Raul Perkins.   MRI of the lumbar spine on 11/22/2016 revealed extensive osseous metastatic disease with pathologic compression fracture of the L3 vertebral body. Posteriorly displace fragments and metastatic soft tissue result in severe canal stenosis and compression of the thecal sac at this level.  CT chest abdomen and pelvis on 11/25/2016 did not reveal any evidence of metastatic disease Perkins than his bones  Patient had decompression surgery of his L3vertebral body which showed:Metastatic adenocarcinoma with features consistent with  primary prostatic origin. See note.  Note: The patient history of previously diagnosed prostate carcinoma is noted. Immunoperoxidase stains were performed on formalin-fixed (non-decalcified) paraffin-embedded tissue in block A3 with appropriate positive controls. The tumor cells stain with CK8/18, PROSTATIC SPECIFIC ANTIGEN, PROSTATIC ACID PHOSPHATASE and P501S. The tumor cells do not stain with CYTOKERATIN 7, CYTOKERATIN 20, CK5/6, P40, GATA-3, CDX-2 or THYROID TRANSCRIPTION FACTOR (TTF-1). These findings are in support of the interpretation of metastatic adenocarcinoma with features consistent with primary prostatic origin.  He has been referred to [name] for further management. Patient also reports that he has hypercalcemia for which she has been referred to some Perkins physician. Patient currently lives with his son but is independent of his ADLs. He does report pain when he bends forward    Interval history-is improving energy levels are better.  He is ambulating without much difficulty.  He had local swelling after receiving his first Lupron shot and could not walk for 2 days but that has now resolved.  ECOG PS- 1 Pain scale- 2 Opioid associated constipation- no  Review of systems- Review of Systems  Constitutional: Positive for malaise/fatigue. Negative for chills, fever and weight loss.  HENT: Negative for congestion, ear discharge and nosebleeds.   Eyes: Negative for blurred vision.  Respiratory: Negative for cough, hemoptysis, sputum production, shortness of breath and wheezing.   Cardiovascular: Negative for chest pain, palpitations, orthopnea and claudication.  Gastrointestinal: Negative for abdominal pain, blood in stool, constipation, diarrhea, heartburn, melena, nausea and vomiting.  Genitourinary: Negative for dysuria, flank pain, frequency, hematuria and urgency.  Musculoskeletal: Positive for back pain. Negative  for joint pain and myalgias.  Skin: Negative for rash.    Neurological: Negative for dizziness, tingling, focal weakness, seizures, weakness and headaches.  Endo/Heme/Allergies: Does not bruise/bleed easily.  Psychiatric/Behavioral: Negative for depression and suicidal ideas. The patient does not have insomnia.       No Known Allergies   Past Medical History:  Diagnosis Date  . Arthritis   . Cancer [facility])    Prostate CA approximately 11 years ago  . Hyperlipidemia   . Hypertension   . Myocardial infarction [facility])      Past Surgical History:  Procedure Laterality Date  . APPENDECTOMY    . BACK SURGERY    . CARDIAC CATHETERIZATION     stent x 1  . CHOLECYSTECTOMY    . LUMBAR LAMINECTOMY/DECOMPRESSION MICRODISCECTOMY 1 LEVEL Left 08/20/2012   Performed by Carlos Ghee, MD at [facility] NEURO ORS  . PROSTATE SURGERY    . TONSILLECTOMY      Social History   Socioeconomic History  . Marital status: Married    Spouse name: Not on file  . Number of children: Not on file  . Years of education: Not on file  . Highest education level: Not on file  Social Needs  . Financial resource strain: Not on file  . Food insecurity - worry: Not on file  . Food insecurity - inability: Not on file  . Transportation needs - medical: Not on file  . Transportation needs - non-medical: Not on file  Occupational History  . Not on file  Tobacco Use  . Smoking status: Never Smoker  . Smokeless tobacco: Never Used  Substance and Sexual Activity  . Alcohol use: No  . Drug use: No  . Sexual activity: Not on file  Perkins Topics Concern  . Not on file  Social History Narrative  . Not on file    No family history on file.   Current Outpatient Medications:  .  abiraterone Acetate (ZYTIGA) 250 MG tablet, Take 4 tablets (1,000 mg total) by mouth daily. Take on an empty stomach 1 hour before or 2 hours after a meal, Disp: 120 tablet, Rfl: 0 .  aspirin EC 81 MG tablet, Take 81 mg by mouth daily., Disp: , Rfl:  .  atorvastatin (LIPITOR) 10 MG tablet, Take  10 mg by mouth daily., Disp: , Rfl:  .  bicalutamide (CASODEX) 50 MG tablet, Take 1 tablet (50 mg total) by mouth daily., Disp: 30 tablet, Rfl: 0 .  cholecalciferol (VITAMIN D) 1000 units tablet, Take 2,000 Units by mouth daily., Disp: , Rfl:  .  clopidogrel (PLAVIX) 75 MG tablet, Take 75 mg by mouth daily., Disp: , Rfl:  .  diltiazem (CARDIZEM SR) 120 MG 12 hr capsule, Take 120 mg by mouth daily. , Disp: , Rfl:  .  HYDROcodone-acetaminophen (NORCO/VICODIN) 5-325 MG per tablet, Take 1-2 tablets by mouth every 4 (four) hours as needed. (Patient not taking: Reported on 01/02/2017), Disp: 30 tablet, Rfl: 1 .  metoprolol tartrate (LOPRESSOR) 25 MG tablet, Take 50 mg by mouth 2 (two) times daily. , Disp: , Rfl:  .  predniSONE (DELTASONE) 5 MG tablet, Take 1 tablet (5 mg total) by mouth daily with breakfast., Disp: 30 tablet, Rfl: 3  Physical exam:  Vitals:   02/14/17 1343  BP: 140/78  Pulse: (!) 53  Resp: 18  Temp: (!) 95.7 F (35.4 C)  TempSrc: Tympanic  Weight: 175 lb 8 oz (79.6 kg)   Physical Exam  Constitutional: He is  oriented to person, place, and time and well-developed, well-nourished, and in no distress.  HENT:  Head: Normocephalic and atraumatic.  Eyes: EOM are normal. Pupils are equal, round, and reactive to light.  Neck: Normal range of motion.  Cardiovascular: Normal rate, regular rhythm and normal heart sounds.  Pulmonary/Chest: Effort normal and breath sounds normal.  Abdominal: Soft. Bowel sounds are normal.  Neurological: He is alert and oriented to person, place, and time.  Skin: Skin is warm and dry.     CMP Latest Ref Rng & Units 01/02/2017  Glucose 65 - 99 mg/dL 98  BUN 6 - 20 mg/dL 18  Creatinine 0.61 - 1.24 mg/dL 0.87  Sodium 135 - 145 mmol/L 137  Potassium 3.5 - 5.1 mmol/L 3.8  Chloride 101 - 111 mmol/L 103  CO2 22 - 32 mmol/L 26  Calcium 8.9 - 10.3 mg/dL 10.8(H)  Total Protein 6.5 - 8.1 g/dL 7.0  Total Bilirubin 0.3 - 1.2 mg/dL 0.5  Alkaline Phos 38 - 126  U/L 159(H)  AST 15 - 41 U/L 16  ALT 17 - 63 U/L 15(L)   CBC Latest Ref Rng & Units 01/02/2017  WBC 3.8 - 10.6 K/uL 6.0  Hemoglobin 13.0 - 18.0 g/dL 11.2(L)  Hematocrit 40.0 - 52.0 % 32.9(L)  Platelets 150 - 440 K/uL 358      Assessment and plan- Patient is a 82 y.o. male with high risk castrate sensitive prostate cancer with bone only mets  Patient received last dose of lupron on 01/14/17. Next dose due in January 2019. He has come off casodex. Discussed that given extensive bone mets, PSA of 252 and prior Gleasons score of 8, I recoomend additional systemic therapy with abiraterone and prednisone at this time. Insurance Josem Kaufmann has been obtained and he does not have any co pay for this for 1 more year  Discussed risks and benefits of Zytiga including all but not limited to hypertension, electrolyte disturbances, fatigue, edema, hot flashes and abnormal LFTs.  Patient understands and agrees to proceed.  Treatment will be given with palliative intent.  PSA and serum testosterone from today is pending.  He will proceed with Zytiga thousand milligrams p.o. daily along with prednisone 5 mg daily starting next week  Hypercalcemia: Patient has seen Dr. De Nurse from [facility] endocrinology in the past.  We will fax their office today's labs as his hypercalcemia is gradually getting worse and there is a concern for primary hyperparathyroidism.  PTH levels were elevated at 194 on 12/11/2016 along with elevated parathyroid intact hormone at 82 I will add myeloma panel and serum free light chains to his labs to be done in 2 weeks time.  I have again advised him not to take any calcium supplements at this time and maintain good hydration  Baseline bone density scan in October 2018 did show osteopenia.  I did discuss the possibility of giving xgeva every 6 months for the same.  Discussed risks and benefits of Xgeva including all but not limited to fatigue, leg swelling, hypocalcemia and osteonecrosis of the jaw.  He  does have a few remaining teeth and poor dentition and should ideally get dental clearance before we can proceed with that.  He will look into seeing a dentist shortly  I will see him back in 1 month's time with CBC and CMP to see how he is tolerating zytiga.  He will get interim CMP at 2 weeks.  If his calcium levels are same or higher in 2 weeks time I  will consider giving him 1 dose of Xgeva anyways     Visit Diagnosis 1. Prostate cancer metastatic to bone ([place])   2. Goals of care, counseling/discussion   3. Hypercalcemia      Dr. Randa Evens, MD, MPH [facility] Pager- [phone] 02/14/2017 2:56 PM

## 2017-02-14 NOTE — Telephone Encounter (Signed)
Oral Chemotherapy Pharmacist Encounter  I spoke with Carlos Perkins and his family for overview of new oral chemotherapy medication: Zytiga (abiraterone) for the treatment of high risk castrate sensitive prostate cancer, planned duration until disease progression or unacceptable drug toxicity.   Pt is doing well. Counseled patient on administration, dosing, side effects, monitoring, drug-food interactions, safe handling, storage, and disposal. Patient will take 4 tablets (1,000 mg total) by mouth daily. Take on an empty stomach 1 hour before or 2 hours after a meal.  Side effects include but not limited to: fatigue, N/V/D, hot flush, cough, and headache.    Reviewed with patient importance of keeping a medication schedule and plan for any missed doses.  Carlos Perkins and his family voiced understanding and appreciation. All questions answered.  Provided patient with [facility] phone number. Patient knows to call the office with questions or concerns. Oral Chemotherapy [facility] will continue to follow.  Darl Pikes, PharmD, BCPS Hematology/Oncology Clinical Pharmacist [facility] [phone]  02/14/2017 2:47 PM

## 2017-02-14 NOTE — Telephone Encounter (Signed)
Faxed a note concerning elevated calcium and copies of labs from today's visit to Army Melia, MD at [facility]. Fax confirmation received.   dhs

## 2017-02-14 NOTE — Progress Notes (Signed)
Here for follow up

## 2017-02-15 LAB — TESTOSTERONE: Testosterone: <3 ng/dL — ABNORMAL LOW (ref 264–916)

## 2017-02-18 ENCOUNTER — Other Ambulatory Visit: Payer: Self-pay

## 2017-02-18 ENCOUNTER — Encounter: Payer: Self-pay | Admitting: Emergency Medicine

## 2017-02-18 ENCOUNTER — Other Ambulatory Visit: Payer: Self-pay | Admitting: *Deleted

## 2017-02-18 ENCOUNTER — Observation Stay
Admission: EM | Admit: 2017-02-18 | Discharge: 2017-02-19 | Disposition: A | Discharge status: Home / Self Care | Payer: Medicare Other | Attending: Internal Medicine | Admitting: Internal Medicine

## 2017-02-18 DIAGNOSIS — Z7902 Long term (current) use of antithrombotics/antiplatelets: Secondary | ICD-10-CM | POA: Insufficient documentation

## 2017-02-18 DIAGNOSIS — E785 Hyperlipidemia, unspecified: Secondary | ICD-10-CM | POA: Insufficient documentation

## 2017-02-18 DIAGNOSIS — I10 Essential (primary) hypertension: Secondary | ICD-10-CM | POA: Insufficient documentation

## 2017-02-18 DIAGNOSIS — I252 Old myocardial infarction: Secondary | ICD-10-CM | POA: Insufficient documentation

## 2017-02-18 DIAGNOSIS — Z7982 Long term (current) use of aspirin: Secondary | ICD-10-CM | POA: Diagnosis not present

## 2017-02-18 DIAGNOSIS — Z8546 Personal history of malignant neoplasm of prostate: Secondary | ICD-10-CM | POA: Diagnosis not present

## 2017-02-18 DIAGNOSIS — I2510 Atherosclerotic heart disease of native coronary artery without angina pectoris: Secondary | ICD-10-CM | POA: Insufficient documentation

## 2017-02-18 DIAGNOSIS — Z79899 Other long term (current) drug therapy: Secondary | ICD-10-CM | POA: Diagnosis not present

## 2017-02-18 DIAGNOSIS — C7951 Secondary malignant neoplasm of bone: Secondary | ICD-10-CM | POA: Diagnosis not present

## 2017-02-18 LAB — HEPATIC FUNCTION PANEL
ALK PHOS: 118 U/L (ref 38–126)
ALT: 14 U/L — AB (ref 17–63)
AST: 17 U/L (ref 15–41)
Albumin: 4.1 g/dL (ref 3.5–5.0)
BILIRUBIN DIRECT: 0.1 mg/dL (ref 0.1–0.5)
BILIRUBIN TOTAL: 0.7 mg/dL (ref 0.3–1.2)
Indirect Bilirubin: 0.6 mg/dL (ref 0.3–0.9)
Total Protein: 7.9 g/dL (ref 6.5–8.1)

## 2017-02-18 LAB — BASIC METABOLIC PANEL
ANION GAP: 8 (ref 5–15)
BUN: 26 mg/dL — ABNORMAL HIGH (ref 6–20)
CALCIUM: 12.5 mg/dL — AB (ref 8.9–10.3)
CO2: 26 mmol/L (ref 22–32)
CREATININE: 1.22 mg/dL (ref 0.61–1.24)
Chloride: 100 mmol/L — ABNORMAL LOW (ref 101–111)
GFR, EST NON AFRICAN AMERICAN: 54 mL/min — AB (ref >60)
Glucose, Bld: 118 mg/dL — ABNORMAL HIGH (ref 65–99)
Potassium: 4.7 mmol/L (ref 3.5–5.1)
SODIUM: 134 mmol/L — AB (ref 135–145)

## 2017-02-18 LAB — CBC
HEMATOCRIT: 43.5 % (ref 40.0–52.0)
HEMOGLOBIN: 13.9 g/dL (ref 13.0–18.0)
MCH: 26.5 pg (ref 26.0–34.0)
MCHC: 32.1 g/dL (ref 32.0–36.0)
MCV: 82.6 fL (ref 80.0–100.0)
Platelets: 341 10*3/uL (ref 150–440)
RBC: 5.26 MIL/uL (ref 4.40–5.90)
RDW: 15.9 % — ABNORMAL HIGH (ref 11.5–14.5)
WBC: 12.8 10*3/uL — AB (ref 3.8–10.6)

## 2017-02-18 MED ORDER — ACETAMINOPHEN 650 MG RE SUPP: 650.0000 mg | Freq: Four times a day (QID) | RECTAL | Status: DC | PRN

## 2017-02-18 MED ORDER — CLOPIDOGREL BISULFATE 75 MG PO TABS
75.0000 mg | ORAL_TABLET | Freq: Every day | ORAL | Status: DC
  Administered 2017-02-18 – 2017-02-19 (×2): 75 mg via ORAL
  Filled 2017-02-18 (×2): qty 1

## 2017-02-18 MED ORDER — ACETAMINOPHEN 325 MG PO TABS: 650.0000 mg | ORAL_TABLET | Freq: Four times a day (QID) | ORAL | Status: DC | PRN

## 2017-02-18 MED ORDER — SODIUM CHLORIDE 0.9 % IV SOLN
INTRAVENOUS | Status: DC
  Administered 2017-02-18 – 2017-02-19 (×2): via INTRAVENOUS

## 2017-02-18 MED ORDER — VITAMIN D 1000 UNITS PO TABS
2000.0000 [IU] | ORAL_TABLET | Freq: Every day | ORAL | Status: DC
  Administered 2017-02-19: 09:00:00 2000 [IU] via ORAL
  Filled 2017-02-18: qty 2

## 2017-02-18 MED ORDER — DILTIAZEM HCL ER 60 MG PO CP12
120.0000 mg | ORAL_CAPSULE | Freq: Every day | ORAL | Status: DC
  Administered 2017-02-19: 120 mg via ORAL
  Filled 2017-02-18: qty 2

## 2017-02-18 MED ORDER — ONDANSETRON HCL 4 MG PO TABS: 4.0000 mg | ORAL_TABLET | Freq: Four times a day (QID) | ORAL | Status: DC | PRN

## 2017-02-18 MED ORDER — ATORVASTATIN CALCIUM 20 MG PO TABS
10.0000 mg | ORAL_TABLET | Freq: Every day | ORAL | Status: DC
  Administered 2017-02-19: 10 mg via ORAL
  Filled 2017-02-18: qty 1

## 2017-02-18 MED ORDER — HYDROCODONE-ACETAMINOPHEN 5-325 MG PO TABS: 1.0000 | ORAL_TABLET | ORAL | Status: DC | PRN

## 2017-02-18 MED ORDER — NITROGLYCERIN 0.4 MG SL SUBL: 0.4000 mg | SUBLINGUAL_TABLET | SUBLINGUAL | Status: DC | PRN

## 2017-02-18 MED ORDER — ASPIRIN EC 81 MG PO TBEC
81.0000 mg | DELAYED_RELEASE_TABLET | Freq: Every day | ORAL | Status: DC
  Administered 2017-02-19: 09:00:00 81 mg via ORAL
  Filled 2017-02-18: qty 1

## 2017-02-18 MED ORDER — SODIUM CHLORIDE 0.9 % IV BOLUS (SEPSIS)
1000.0000 mL | Freq: Once | INTRAVENOUS | Status: AC
  Administered 2017-02-18: 1000 mL via INTRAVENOUS

## 2017-02-18 MED ORDER — METOPROLOL TARTRATE 50 MG PO TABS
50.0000 mg | ORAL_TABLET | Freq: Two times a day (BID) | ORAL | Status: DC
  Administered 2017-02-19: 50 mg via ORAL
  Filled 2017-02-18 (×2): qty 1

## 2017-02-18 MED ORDER — POLYETHYLENE GLYCOL 3350 17 G PO PACK: 17.0000 g | PACK | Freq: Every day | ORAL | Status: DC | PRN

## 2017-02-18 MED ORDER — ABIRATERONE ACETATE 250 MG PO TABS: 1000.0000 mg | ORAL_TABLET | Freq: Every day | ORAL | Status: DC

## 2017-02-18 MED ORDER — ENOXAPARIN SODIUM 40 MG/0.4ML ~~LOC~~ SOLN: 40.0000 mg | SUBCUTANEOUS | Status: DC

## 2017-02-18 MED ORDER — ZOLEDRONIC ACID 4 MG/5ML IV CONC
4.0000 mg | Freq: Once | INTRAVENOUS | Status: AC
  Administered 2017-02-18: 4 mg via INTRAVENOUS
  Filled 2017-02-18: qty 5

## 2017-02-18 MED ORDER — BICALUTAMIDE 50 MG PO TABS
50.0000 mg | ORAL_TABLET | Freq: Every day | ORAL | Status: DC
  Administered 2017-02-19: 50 mg via ORAL
  Filled 2017-02-18 (×2): qty 1

## 2017-02-18 MED ORDER — ONDANSETRON HCL 4 MG/2ML IJ SOLN: 4.0000 mg | Freq: Four times a day (QID) | INTRAMUSCULAR | Status: DC | PRN

## 2017-02-18 MED ORDER — PREDNISONE 10 MG PO TABS
5.0000 mg | ORAL_TABLET | Freq: Every day | ORAL | Status: DC
  Administered 2017-02-19: 09:00:00 5 mg via ORAL
  Filled 2017-02-18: qty 1

## 2017-02-18 NOTE — H&P (Signed)
[place] [facility] [place] NAME: Carlos Perkins    MR#:  [id number]  DATE OF BIRTH:  Jul 16, 1934  DATE OF ADMISSION:  02/18/2017  PRIMARY CARE PHYSICIAN: Idelle Crouch, MD   REQUESTING/REFERRING PHYSICIAN:   CHIEF COMPLAINT:   Chief Complaint  Patient presents with  . Abnormal Lab    HISTORY OF PRESENT ILLNESS: Carlos Perkins  is a 82 y.o. male with a known history of metastatic prostate cancer to bone presenting to the emergency room for admission for hypercalcemia per oncology/Dr. Janese Banks, calcium level 12.5 up from 11.8, sodium 134, chloride 100, patient without complaint, family member at the bedside, patient is being admitted for acute on chronic hypercalcemia most likely brought on by cancer.  PAST MEDICAL HISTORY:   Past Medical History:  Diagnosis Date  . Arthritis   . Cancer [facility])    Prostate CA approximately 11 years ago  . Hyperlipidemia   . Hypertension   . Myocardial infarction [facility])     PAST SURGICAL HISTORY:  Past Surgical History:  Procedure Laterality Date  . APPENDECTOMY    . BACK SURGERY    . CARDIAC CATHETERIZATION     stent x 1  . CHOLECYSTECTOMY    . LUMBAR LAMINECTOMY/DECOMPRESSION MICRODISCECTOMY Left 08/20/2012   Procedure: LUMBAR LAMINECTOMY/DECOMPRESSION MICRODISCECTOMY 1 LEVEL;  Surgeon: Faythe Ghee, MD;  Location: [facility] NEURO ORS;  Service: Neurosurgery;  Laterality: Left;  lumbar four-five  . PROSTATE SURGERY    . TONSILLECTOMY      SOCIAL HISTORY:  Social History   Tobacco Use  . Smoking status: Never Smoker  . Smokeless tobacco: Never Used  Substance Use Topics  . Alcohol use: No    FAMILY HISTORY: No family history on file.  DRUG ALLERGIES: No Known Allergies  REVIEW OF SYSTEMS:   CONSTITUTIONAL: No fever, fatigue or weakness.  EYES: No blurred or double vision.  EARS, NOSE, AND THROAT: No tinnitus or ear pain.  RESPIRATORY: No cough, shortness of breath, wheezing or hemoptysis.   CARDIOVASCULAR: No chest pain, orthopnea, edema.  GASTROINTESTINAL: No nausea, vomiting, diarrhea or abdominal pain.  GENITOURINARY: No dysuria, hematuria.  ENDOCRINE: No polyuria, nocturia,  HEMATOLOGY: No anemia, easy bruising or bleeding SKIN: No rash or lesion. MUSCULOSKELETAL: No joint pain or arthritis.   NEUROLOGIC: No tingling, numbness, weakness.  PSYCHIATRY: No anxiety or depression.   MEDICATIONS AT HOME:  Prior to Admission medications   Medication Sig Start Date End Date Taking? Authorizing Provider  abiraterone Acetate (ZYTIGA) 250 MG tablet Take 4 tablets (1,000 mg total) by mouth daily. Take on an empty stomach 1 hour before or 2 hours after a meal 01/16/17  Yes Sindy Guadeloupe, MD  aspirin EC 81 MG tablet Take 81 mg by mouth daily.   Yes [provider]  atorvastatin (LIPITOR) 10 MG tablet Take 10 mg by mouth daily.   Yes [provider]  cholecalciferol (VITAMIN D) 1000 units tablet Take 2,000 Units by mouth daily.   Yes [provider]  clopidogrel (PLAVIX) 75 MG tablet Take 75 mg by mouth daily.   Yes [provider]  diltiazem (CARDIZEM SR) 120 MG 12 hr capsule Take 120 mg by mouth daily.    Yes [provider]  metoprolol tartrate (LOPRESSOR) 25 MG tablet Take 50 mg by mouth 2 (two) times daily.    Yes [provider]  predniSONE (DELTASONE) 5 MG tablet Take 1 tablet (5 mg total) daily with breakfast by mouth.  02/14/17  Yes Sindy Guadeloupe, MD  bicalutamide (CASODEX) 50 MG tablet Take 1 tablet (50 mg total) by mouth daily. Patient not taking: Reported on 02/18/2017 01/15/17   Sindy Guadeloupe, MD  chlorthalidone (HYGROTON) 25 MG tablet Take 12.5 mg by mouth 2 (two) times daily.  01/30/17 01/30/18  [provider]  HYDROcodone-acetaminophen (NORCO/VICODIN) 5-325 MG per tablet Take 1-2 tablets by mouth every 4 (four) hours as needed. Patient not taking: Reported on 01/02/2017 08/21/12   Karie Chimera, MD   nitroGLYCERIN (NITROSTAT) 0.4 MG SL tablet As directed 01/25/16   [provider]      PHYSICAL EXAMINATION:   VITAL SIGNS: Blood pressure (!) 145/73, pulse (!) 57, temperature 97.6 F (36.4 C), temperature source Oral, resp. rate 15, height 5\' 10"  (1.778 m), weight 78.9 kg (174 lb), SpO2 100 %.  GENERAL:  82 y.o.-year-old patient lying in the bed with no acute distress.  EYES: Pupils equal, round, reactive to light and accommodation. No scleral icterus. Extraocular muscles intact.  HEENT: Head atraumatic, normocephalic. Oropharynx and nasopharynx clear.  NECK:  Supple, no jugular venous distention. No thyroid enlargement, no tenderness.  LUNGS: Normal breath sounds bilaterally, no wheezing, rales,rhonchi or crepitation. No use of accessory muscles of respiration.  CARDIOVASCULAR: S1, S2 normal. No murmurs, rubs, or gallops.  ABDOMEN: Soft, nontender, nondistended. Bowel sounds present. No organomegaly or mass.  EXTREMITIES: No pedal edema, cyanosis, or clubbing.  NEUROLOGIC: Cranial nerves II through XII are intact. MAES. Gait not checked.  PSYCHIATRIC: The patient is alert and oriented x 3.  SKIN: No obvious rash, lesion, or ulcer.   LABORATORY PANEL:   CBC Recent Labs  Lab 02/14/17 1332 02/18/17 1222  WBC 11.8* 12.8*  HGB 13.4 13.9  HCT 40.9 43.5  PLT 355 341  MCV 82.5 82.6  MCH 27.0 26.5  MCHC 32.7 32.1  RDW 15.8* 15.9*  LYMPHSABS 2.7  --   MONOABS 1.0  --   EOSABS 0.1  --   BASOSABS 0.1  --    ------------------------------------------------------------------------------------------------------------------  Chemistries  Recent Labs  Lab 02/14/17 1332 02/18/17 1222  NA 133* 134*  K 3.9 4.7  CL 99* 100*  CO2 28 26  GLUCOSE 106* 118*  BUN 40* 26*  CREATININE 1.15 1.22  CALCIUM 11.8* 12.5*  AST 16 17  ALT 16* 14*  ALKPHOS 107 118  BILITOT 0.5 0.7    ------------------------------------------------------------------------------------------------------------------ estimated creatinine clearance is 49.9 mL/min (by C-G formula based on SCr of 1.22 mg/dL). ------------------------------------------------------------------------------------------------------------------ No results for input(s): TSH, T4TOTAL, T3FREE, THYROIDAB in the last 72 hours.  Invalid input(s): FREET3   Coagulation profile No results for input(s): INR, PROTIME in the last 168 hours. ------------------------------------------------------------------------------------------------------------------- No results for input(s): DDIMER in the last 72 hours. -------------------------------------------------------------------------------------------------------------------  Cardiac Enzymes No results for input(s): CKMB, TROPONINI, MYOGLOBIN in the last 168 hours.  Invalid input(s): CK ------------------------------------------------------------------------------------------------------------------ Invalid input(s): POCBNP  ---------------------------------------------------------------------------------------------------------------  Urinalysis No results found for: COLORURINE, APPEARANCEUR, LABSPEC, PHURINE, GLUCOSEU, HGBUR, BILIRUBINUR, KETONESUR, PROTEINUR, UROBILINOGEN, NITRITE, LEUKOCYTESUR   RADIOLOGY: No results found.  EKG: Orders placed or performed during the hospital encounter of 08/13/12  . EKG 12 lead  . EKG 12 lead    IMPRESSION AND PLAN: 1 acute on chronic hypercalcemia Most likely secondary to metastatic recurrent prostate cancer to bone Referred to the observation unit, oncology consulted for expert opinion, Zometa 4 mg IV now, IV fluids for rehydration, check BMP in the morning  2 chronic benign essential hypertension Stable Continue home regiment  3 chronic hyperlipidemia, unspecified Stable Continue statin therapy  4 chronic  metastatic prostate cancer to bone With associated cancer pain Continue pain control measures, oncology consulted for continuity of care  Full code Condition stable Prognosis-defer to oncology Disposition home on tomorrow barring any complications DVT prophylaxis with Lovenox subcu  All the records are reviewed and case discussed with ED provider. Management plans discussed with the patient, family and they are in agreement.  CODE STATUS: Code Status History    This patient does not have a recorded code status. Please follow your organizational policy for patients in this situation.       TOTAL TIME TAKING CARE OF THIS PATIENT: 35 minutes.    Avel Peace Salary M.D on 02/18/2017   Between 7am to 6pm - Pager - [phone]  After 6pm go to [email or web address] - password [email or web address] [facility]  Office  [phone]  CC: Primary care physician; Idelle Crouch, MD   Note: This dictation was prepared with Dragon dictation along with smaller phrase technology. Any transcriptional errors that result from this process are unintentional.

## 2017-02-18 NOTE — ED Notes (Signed)
Pt states that the cancer center called him today and told him to come to ER to have calcium checked because it is too high.

## 2017-02-18 NOTE — ED Triage Notes (Signed)
Patient presents to ED via POV. Patient was called and told to come in because his calcium level was high. Patient has no complaints at this time.

## 2017-02-18 NOTE — ED Provider Notes (Signed)
[facility] Emergency Department Provider Note  ____________________________________________   First MD Initiated Contact with Patient 02/18/17 1521     (approximate)  I have reviewed the triage vital signs and the nursing notes.   HISTORY  Chief Complaint Abnormal Lab   HPI Carlos Perkins is a 82 y.o. male a history of hypertension as well as myocardial infarction, prostate cancer suspected parathyroid disorder who is presenting to the emergency department today with hypercalcemia.  The patient's calcium has trended from 11.8 up to 12.5 over the past week.  He was called by his endocrinologist to do today to present to the emergency department for treatment.  The patient is denying any symptoms at this time.  Denies any pain, fever, palpitations.    Past Medical History:  Diagnosis Date  . Arthritis   . Cancer [facility])    Prostate CA approximately 11 years ago  . Hyperlipidemia   . Hypertension   . Myocardial infarction [facility])     Patient Active Problem List   Diagnosis Date Noted  . Goals of care, counseling/discussion 01/15/2017  . Prostate cancer metastatic to bone ([place]) 01/14/2017  . Hypercalcemia 12/11/2016  . Lumbar stenosis 03/17/2013  . Lumbar radiculopathy, acute 03/02/2013  . Essential hypertension 07/04/2011  . H/O prostate cancer 07/02/2011  . Hyperlipidemia, unspecified 07/02/2011  . ST elevation MI (STEMI) ([place]) 04/01/2006    Past Surgical History:  Procedure Laterality Date  . APPENDECTOMY    . BACK SURGERY    . CARDIAC CATHETERIZATION     stent x 1  . CHOLECYSTECTOMY    . LUMBAR LAMINECTOMY/DECOMPRESSION MICRODISCECTOMY Left 08/20/2012   Procedure: LUMBAR LAMINECTOMY/DECOMPRESSION MICRODISCECTOMY 1 LEVEL;  Surgeon: Faythe Ghee, MD;  Location: [facility];  Service: Neurosurgery;  Laterality: Left;  lumbar four-five  . PROSTATE SURGERY    . TONSILLECTOMY      Prior to Admission medications   Medication Sig  Start Date End Date Taking? Authorizing Provider  abiraterone Acetate (ZYTIGA) 250 MG tablet Take 4 tablets (1,000 mg total) by mouth daily. Take on an empty stomach 1 hour before or 2 hours after a meal 01/16/17   Sindy Guadeloupe, MD  aspirin EC 81 MG tablet Take 81 mg by mouth daily.    [provider]  atorvastatin (LIPITOR) 10 MG tablet Take 10 mg by mouth daily.    [provider]  bicalutamide (CASODEX) 50 MG tablet Take 1 tablet (50 mg total) by mouth daily. 01/15/17   Sindy Guadeloupe, MD  chlorthalidone (HYGROTON) 25 MG tablet Take by mouth. 01/30/17 01/30/18  [provider]  cholecalciferol (VITAMIN D) 1000 units tablet Take 2,000 Units by mouth daily.    [provider]  clopidogrel (PLAVIX) 75 MG tablet Take 75 mg by mouth daily.    [provider]  diltiazem (CARDIZEM SR) 120 MG 12 hr capsule Take 120 mg by mouth daily.     [provider]  HYDROcodone-acetaminophen (NORCO/VICODIN) 5-325 MG per tablet Take 1-2 tablets by mouth every 4 (four) hours as needed. Patient not taking: Reported on 01/02/2017 08/21/12   Karie Chimera, MD  metoprolol tartrate (LOPRESSOR) 25 MG tablet Take 50 mg by mouth 2 (two) times daily.     [provider]  nitroGLYCERIN (NITROSTAT) 0.4 MG SL tablet As directed 01/25/16   [provider]  predniSONE (DELTASONE) 5 MG tablet Take 1 tablet (5 mg total) daily with breakfast by mouth. 02/14/17   Sindy Guadeloupe, MD  Allergies Patient has no known allergies.  No family history on file.  Social History Social History   Tobacco Use  . Smoking status: Never Smoker  . Smokeless tobacco: Never Used  Substance Use Topics  . Alcohol use: No  . Drug use: No    Review of Systems  Constitutional: No fever/chills Eyes: No visual changes. ENT: No sore throat. Cardiovascular: Denies chest pain. Respiratory: Denies shortness of breath. Gastrointestinal: No abdominal pain.  No nausea, no  vomiting.  No diarrhea.  No constipation. Genitourinary: Negative for dysuria. Musculoskeletal: Negative for back pain. Skin: Negative for rash. Neurological: Negative for headaches, focal weakness or numbness.   ____________________________________________   PHYSICAL EXAM:  VITAL SIGNS: ED Triage Vitals  Enc Vitals Group     BP 02/18/17 1216 (!) 145/73     Pulse Rate 02/18/17 1216 (!) 57     Resp 02/18/17 1216 15     Temp 02/18/17 1216 97.6 F (36.4 C)     Temp Source 02/18/17 1216 Oral     SpO2 02/18/17 1216 100 %     Weight 02/18/17 1217 174 lb (78.9 kg)     Height 02/18/17 1217 5\' 10"  (1.778 m)     Head Circumference --      Peak Flow --      Pain Score 02/18/17 1528 0     Pain Loc --      Pain Edu? --      Excl. in [place]? --     Constitutional: Alert and oriented. Well appearing and in no acute distress. Eyes: Conjunctivae are normal.  Head: Atraumatic. Nose: No congestion/rhinnorhea. Mouth/Throat: Mucous membranes are moist.  Neck: No stridor.   Cardiovascular: Normal rate, regular rhythm. Grossly normal heart sounds.   Respiratory: Normal respiratory effort.  No retractions. Lungs CTAB. Gastrointestinal: Soft and nontender. No distention.  Musculoskeletal: No lower extremity tenderness nor edema.  No joint effusions. Neurologic:  Normal speech and language. No gross focal neurologic deficits are appreciated. Skin:  Skin is warm, dry and intact. No rash noted. Psychiatric: Mood and affect are normal. Speech and behavior are normal.  ____________________________________________   LABS (all labs ordered are listed, but only abnormal results are displayed)  Labs Reviewed  BASIC METABOLIC PANEL - Abnormal; Notable for the following components:      Result Value   Sodium 134 (*)    Chloride 100 (*)    Glucose, Bld 118 (*)    BUN 26 (*)    Calcium 12.5 (*)    GFR calc non Af Amer 54 (*)    All other components within normal limits  CBC - Abnormal; Notable for  the following components:   WBC 12.8 (*)    RDW 15.9 (*)    All other components within normal limits  HEPATIC FUNCTION PANEL   ____________________________________________  EKG   ____________________________________________  RADIOLOGY   ____________________________________________   PROCEDURES  Procedure(s) performed:   Procedures  Critical Care performed:   ____________________________________________   INITIAL IMPRESSION / ASSESSMENT AND PLAN / ED COURSE  Pertinent labs & imaging results that were available during my care of the patient were reviewed by me and considered in my medical decision making (see chart for details).  DDX: Hyperglycemia, parathyroid cancer, hyperparathyroidism, metastatic prostate cancer  As part of my medical decision making, I reviewed the following data within the [facility] chart reviewed  ----------------------------------------- 3:47 PM on 02/18/2017 -----------------------------------------  I discussed case with the patient's oncologist,  Dr. Janese Banks, who is recommending admission for fluids and also recommends a dose of Zometa.  Discussed this with the patient and he is agreeable.  We discussed that he was being admitted on a precautionary basis in order to treat him preemptively.  I did discuss the possibility with Dr.Rao of the patient being treated in the emergency department and released.  However, she is about the thanks giving holiday coming up with the patient not being able to follow-up for a prolonged period of time.  The patient will be admitted.  Signed out to Dr. Jerelyn Charles.        ____________________________________________   FINAL CLINICAL IMPRESSION(S) / ED DIAGNOSES  Hypercalcemia    NEW MEDICATIONS STARTED DURING THIS VISIT:  This SmartLink is deprecated. Use AVSMEDLIST instead to display the medication list for a patient.   Note:  This document was prepared using Dragon voice  recognition software and may include unintentional dictation errors.     Orbie Pyo, MD 02/18/17 [phone]

## 2017-02-19 ENCOUNTER — Other Ambulatory Visit: Payer: Self-pay | Admitting: *Deleted

## 2017-02-19 ENCOUNTER — Telehealth: Payer: Self-pay | Admitting: *Deleted

## 2017-02-19 DIAGNOSIS — E213 Hyperparathyroidism, unspecified: Secondary | ICD-10-CM

## 2017-02-19 LAB — BASIC METABOLIC PANEL
Anion gap: 7 (ref 5–15)
BUN: 24 mg/dL — ABNORMAL HIGH (ref 6–20)
CALCIUM: 10.5 mg/dL — AB (ref 8.9–10.3)
CO2: 27 mmol/L (ref 22–32)
CREATININE: 0.98 mg/dL (ref 0.61–1.24)
Chloride: 105 mmol/L (ref 101–111)
Glucose, Bld: 99 mg/dL (ref 65–99)
Potassium: 3.8 mmol/L (ref 3.5–5.1)
SODIUM: 139 mmol/L (ref 135–145)

## 2017-02-19 LAB — CALCIUM, IONIZED: CALCIUM, IONIZED, SERUM: 6.8 mg/dL — AB (ref 4.5–5.6)

## 2017-02-19 NOTE — Progress Notes (Signed)
Discharge instructions given and went over with patient and patients son at bedside. All questions answered. Patient discharged home with son via wheelchair by volunteer services. Madlyn Frankel, RN

## 2017-02-19 NOTE — Care Management Obs Status (Signed)
[facility] NOTIFICATION   Patient Details  Name: Carlos Perkins MRN: [id number] Date of Birth: 1934-07-29   Medicare Observation Status Notification Given:  Yes    Shelbie Ammons, RN 02/19/2017, 9:06 AM

## 2017-02-19 NOTE — Discharge Summary (Signed)
[facility] [place] NAME: Carlos Perkins    MR#:  [id number]  DATE OF BIRTH:  [birth year]  DATE OF ADMISSION:  02/18/2017   ADMITTING PHYSICIAN: Gorden Harms, MD  DATE OF DISCHARGE: 02/19/17  PRIMARY CARE PHYSICIAN: Idelle Crouch, MD   ADMISSION DIAGNOSIS:   Hypercalcemia [E83.52]  DISCHARGE DIAGNOSIS:   Active Problems:   Hypercalcemia   SECONDARY DIAGNOSIS:   Past Medical History:  Diagnosis Date  . Arthritis   . Cancer [facility])    Prostate CA approximately 11 years ago  . Hyperlipidemia   . Hypertension   . Myocardial infarction [facility])     HOSPITAL COURSE:   82 year old male with past medical history significant for metastatic prostrate cancer, possible primary hyperparathyroidism, hypertension and hyperlipidemia comes to hospital secondary to hypercalcemia  #1 hypercalcemia- patient has had trouble with worsening calcium levels lately. -He is also following with [facility] endocrinology and his PTH levels are elevated - Continue with outpatient plans for parathyroid surgery -Also from underlying metastatic prostrate cancer to spine. -Received IV fluids and Zometa in the hospital. Calcium is down to 10.5. Okay for discharge today  #2 metastatic prostrate cancer-diagnosed several years ago, status post surgery. Repeat MRI of the lower back in August 2018 showed metastatic disease to the lumbar spine. -Currently on Lupron injections, zytiga and also Xgeva, and prednisone with that -management per oncology  #3 CAD-on aspirin, Plavix and statin. -Also on metoprolol  #4 hypertension-on metoprolol and Cardizem. Hold chlorthalidone at discharge  Stable for discharge today  DISCHARGE CONDITIONS:   Guarded CONSULTS OBTAINED:   Treatment Team:  Sindy Guadeloupe, MD  DRUG ALLERGIES:   No Known Allergies DISCHARGE MEDICATIONS:   Allergies as of 02/19/2017   No Known Allergies     Medication List    STOP taking  these medications   bicalutamide 50 MG tablet Commonly known as:  CASODEX   chlorthalidone 25 MG tablet Commonly known as:  HYGROTON   HYDROcodone-acetaminophen 5-325 MG tablet Commonly known as:  NORCO/VICODIN     TAKE these medications   abiraterone acetate 250 MG tablet Commonly known as:  ZYTIGA Take 4 tablets (1,000 mg total) by mouth daily. Take on an empty stomach 1 hour before or 2 hours after a meal   aspirin EC 81 MG tablet Take 81 mg by mouth daily.   atorvastatin 10 MG tablet Commonly known as:  LIPITOR Take 10 mg by mouth daily.   cholecalciferol 1000 units tablet Commonly known as:  VITAMIN D Take 2,000 Units by mouth daily.   clopidogrel 75 MG tablet Commonly known as:  PLAVIX Take 75 mg by mouth daily.   diltiazem 120 MG 12 hr capsule Commonly known as:  CARDIZEM SR Take 120 mg by mouth daily.   metoprolol tartrate 25 MG tablet Commonly known as:  LOPRESSOR Take 50 mg by mouth 2 (two) times daily.   nitroGLYCERIN 0.4 MG SL tablet Commonly known as:  NITROSTAT As directed   predniSONE 5 MG tablet Commonly known as:  DELTASONE Take 1 tablet (5 mg total) daily with breakfast by mouth.        DISCHARGE INSTRUCTIONS:   1. Follow up at the cancer center in 5 days for repeat calcium check 2. Follow-up with [facility] endocrinology for addressing primary hyperparathyroidism  DIET:   Cardiac diet  ACTIVITY:   Activity as tolerated  OXYGEN:   Home Oxygen: No.  Oxygen Delivery: room air  DISCHARGE  LOCATION:   home   If you experience worsening of your admission symptoms, develop shortness of breath, life threatening emergency, suicidal or homicidal thoughts you must seek medical attention immediately by calling 911 or calling your MD immediately  if symptoms less severe.  You Must read complete instructions/literature along with all the possible adverse reactions/side effects for all the Medicines you take and that have been prescribed to you.  Take any new Medicines after you have completely understood and accpet all the possible adverse reactions/side effects.   Please note  You were cared for by a hospitalist during your hospital stay. If you have any questions about your discharge medications or the care you received while you were in the hospital after you are discharged, you can call the unit and asked to speak with the hospitalist on call if the hospitalist that took care of you is not available. Once you are discharged, your primary care physician will handle any further medical issues. Please note that NO REFILLS for any discharge medications will be authorized once you are discharged, as it is imperative that you return to your primary care physician (or establish a relationship with a primary care physician if you do not have one) for your aftercare needs so that they can reassess your need for medications and monitor your lab values.    On the day of Discharge:  VITAL SIGNS:   Blood pressure (!) 114/55, pulse (!) 51, temperature 97.7 F (36.5 C), temperature source Oral, resp. rate 18, height 5\' 10"  (1.778 m), weight 78.9 kg (174 lb), SpO2 98 %.  PHYSICAL EXAMINATION:    GENERAL:  82 y.o.-year-old patient lying in the bed with no acute distress.  EYES: Pupils equal, round, reactive to light and accommodation. No scleral icterus. Extraocular muscles intact.  HEENT: Head atraumatic, normocephalic. Oropharynx and nasopharynx clear.  NECK:  Supple, no jugular venous distention. No thyroid enlargement, no tenderness.  LUNGS: Normal breath sounds bilaterally, no wheezing, rales,rhonchi or crepitation. No use of accessory muscles of respiration. Decreased basilar breath sounds CARDIOVASCULAR: S1, S2 normal. No murmurs, rubs, or gallops.  ABDOMEN: Soft, non-tender, non-distended. Bowel sounds present. No organomegaly or mass.  EXTREMITIES: No pedal edema, cyanosis, or clubbing.  NEUROLOGIC: Cranial nerves II through XII are  intact. Muscle strength 5/5 in all extremities. Sensation intact. Gait not checked.  PSYCHIATRIC: The patient is alert and oriented x 3.  SKIN: No obvious rash, lesion, or ulcer.   DATA REVIEW:   CBC Recent Labs  Lab 02/18/17 1222  WBC 12.8*  HGB 13.9  HCT 43.5  PLT 341    Chemistries  Recent Labs  Lab 02/18/17 1222 02/19/17 0356  NA 134* 139  K 4.7 3.8  CL 100* 105  CO2 26 27  GLUCOSE 118* 99  BUN 26* 24*  CREATININE 1.22 0.98  CALCIUM 12.5* 10.5*  AST 17  --   ALT 14*  --   ALKPHOS 118  --   BILITOT 0.7  --      Microbiology Results  Results for orders placed or performed during the hospital encounter of 08/13/12  Surgical pcr screen     Status: None   Collection Time: 08/13/12 12:41 PM  Result Value Ref Range Status   MRSA, PCR NEGATIVE NEGATIVE Final   Staphylococcus aureus NEGATIVE NEGATIVE Final    Comment:        The Xpert SA Assay (FDA approved for NASAL specimens in patients over 59 years of age), is one component  of a comprehensive surveillance program.  Test performance has been validated by [facility] for patients greater than or equal to 40 year old. It is not intended to diagnose infection nor to guide or monitor treatment.    RADIOLOGY:  No results found.   Management plans discussed with the patient, family and they are in agreement.  CODE STATUS:     Code Status Orders  (From admission, onward)        Start     Ordered   02/18/17 1744  Full code  Continuous     02/18/17 1744    Code Status History    Date Active Date Inactive Code Status Order ID Comments User Context   This patient has a current code status but no historical code status.      TOTAL TIME TAKING CARE OF THIS PATIENT: 38 minutes.    Gladstone Lighter M.D on 02/19/2017 at 9:49 AM  Between 7am to 6pm - Pager - [phone]  After 6pm go to [email or web address] - Proofreader  [facility]  Office  [phone]  CC:  Primary care physician; Idelle Crouch, MD   Note: This dictation was prepared with Dragon dictation along with smaller phrase technology. Any transcriptional errors that result from this process are unintentional.

## 2017-02-19 NOTE — Telephone Encounter (Signed)
I have attempted to call patient to discuss options of referral to surgery at [facility] vs referral to Dr. Pryor Ochoa with ENT locally. I attempted to call patient, spouse and sister on the numbers listed in the chart. I had to leave vm message requesting call back to discuss.

## 2017-02-24 ENCOUNTER — Other Ambulatory Visit: Payer: Self-pay | Admitting: *Deleted

## 2017-02-24 ENCOUNTER — Telehealth: Payer: Self-pay | Admitting: *Deleted

## 2017-02-24 ENCOUNTER — Inpatient Hospital Stay: Payer: Medicare Other

## 2017-02-24 DIAGNOSIS — C61 Malignant neoplasm of prostate: Secondary | ICD-10-CM

## 2017-02-24 DIAGNOSIS — C7951 Secondary malignant neoplasm of bone: Secondary | ICD-10-CM

## 2017-02-24 LAB — COMPREHENSIVE METABOLIC PANEL
ALK PHOS: 97 U/L (ref 38–126)
ALT: 16 U/L — ABNORMAL LOW (ref 17–63)
ANION GAP: 7 (ref 5–15)
AST: 17 U/L (ref 15–41)
Albumin: 3.7 g/dL (ref 3.5–5.0)
BILIRUBIN TOTAL: 0.6 mg/dL (ref 0.3–1.2)
BUN: 25 mg/dL — ABNORMAL HIGH (ref 6–20)
CALCIUM: 11 mg/dL — AB (ref 8.9–10.3)
CO2: 25 mmol/L (ref 22–32)
CREATININE: 1.02 mg/dL (ref 0.61–1.24)
Chloride: 103 mmol/L (ref 101–111)
Glucose, Bld: 118 mg/dL — ABNORMAL HIGH (ref 65–99)
Potassium: 4.3 mmol/L (ref 3.5–5.1)
SODIUM: 135 mmol/L (ref 135–145)
TOTAL PROTEIN: 6.9 g/dL (ref 6.5–8.1)

## 2017-02-24 NOTE — Telephone Encounter (Signed)
Per Dr. Elroy Channel request, called patient about his elevated calcium and missed ENT appointment with Dr. Pryor Ochoa today. Patient states that he does not want to see another doctor, because he is already seeing three doctors. Explained to patient how important it is for him to see ENT and that his calcium is going to keep going up if he doesn't do anything about it. He stated that he is going to see Dr. Jeannette Corpus on Friday at 2:00pm and he will talk to her about this at that visit. He said that if she tells him to see an ENT at [facility], that he will consider it.    dhs

## 2017-02-24 NOTE — Telephone Encounter (Signed)
Ok I will email her and let her know. Thanks

## 2017-02-24 NOTE — Telephone Encounter (Signed)
Patient was a No Show for his appointment this morning that he was "worked in " to their schedule as requested. Just wanted Dr Janese Banks to know he did not show up

## 2017-02-25 LAB — MULTIPLE MYELOMA PANEL, SERUM
ALPHA 1: 0.2 g/dL (ref 0.0–0.4)
ALPHA2 GLOB SERPL ELPH-MCNC: 0.8 g/dL (ref 0.4–1.0)
Albumin SerPl Elph-Mcnc: 3.4 g/dL (ref 2.9–4.4)
Albumin/Glob SerPl: 1.1 (ref 0.7–1.7)
B-Globulin SerPl Elph-Mcnc: 1.1 g/dL (ref 0.7–1.3)
GLOBULIN, TOTAL: 3.1 g/dL (ref 2.2–3.9)
Gamma Glob SerPl Elph-Mcnc: 0.9 g/dL (ref 0.4–1.8)
IGG (IMMUNOGLOBIN G), SERUM: 788 mg/dL (ref 700–1600)
IgA: 247 mg/dL (ref 61–437)
IgM (Immunoglobulin M), Srm: 89 mg/dL (ref 15–143)
TOTAL PROTEIN ELP: 6.5 g/dL (ref 6.0–8.5)

## 2017-02-25 LAB — KAPPA/LAMBDA LIGHT CHAINS
Kappa free light chain: 21.7 mg/L — ABNORMAL HIGH (ref 3.3–19.4)
Kappa, lambda light chain ratio: 1.27 (ref 0.26–1.65)
Lambda free light chains: 17.1 mg/L (ref 5.7–26.3)

## 2017-03-11 ENCOUNTER — Other Ambulatory Visit: Payer: Self-pay | Admitting: *Deleted

## 2017-03-11 ENCOUNTER — Other Ambulatory Visit: Payer: Self-pay | Admitting: Pharmacist

## 2017-03-11 DIAGNOSIS — C7951 Secondary malignant neoplasm of bone: Principal | ICD-10-CM

## 2017-03-11 DIAGNOSIS — C61 Malignant neoplasm of prostate: Secondary | ICD-10-CM

## 2017-03-11 MED ORDER — ABIRATERONE ACETATE 250 MG PO TABS: 1000.0000 mg | ORAL_TABLET | Freq: Every day | ORAL | 0 refills | Status: DC

## 2017-03-11 NOTE — Progress Notes (Signed)
Oral Chemotherapy Pharmacist Encounter  Refill prescription was sent to [facility]. Carlos Perkins is receiving his medication from the manufacturer. Prescription was redirected to [facility], the pharmacy that dispenses Zytiga for this manufacturer patient assistance program.    Darl Pikes, PharmD, BCPS Hematology/Oncology Clinical Pharmacist [facility] [phone]  03/11/2017 2:01 PM

## 2017-03-11 NOTE — Telephone Encounter (Signed)
Zytiga refill needed to be called in to [facility] patient assistance pharmacy [phone]

## 2017-03-18 ENCOUNTER — Inpatient Hospital Stay: Payer: Medicare Other | Attending: Oncology

## 2017-03-18 ENCOUNTER — Encounter: Payer: Self-pay | Admitting: Oncology

## 2017-03-18 ENCOUNTER — Inpatient Hospital Stay (HOSPITAL_BASED_OUTPATIENT_CLINIC_OR_DEPARTMENT_OTHER): Payer: Medicare Other | Admitting: Oncology

## 2017-03-18 ENCOUNTER — Inpatient Hospital Stay: Payer: Medicare Other

## 2017-03-18 VITALS — BP 175/83 | Temp 96.7°F | Resp 18 | Wt 188.0 lb

## 2017-03-18 DIAGNOSIS — E210 Primary hyperparathyroidism: Secondary | ICD-10-CM | POA: Diagnosis not present

## 2017-03-18 DIAGNOSIS — Z79899 Other long term (current) drug therapy: Secondary | ICD-10-CM | POA: Insufficient documentation

## 2017-03-18 DIAGNOSIS — E785 Hyperlipidemia, unspecified: Secondary | ICD-10-CM | POA: Insufficient documentation

## 2017-03-18 DIAGNOSIS — I10 Essential (primary) hypertension: Secondary | ICD-10-CM | POA: Diagnosis not present

## 2017-03-18 DIAGNOSIS — I252 Old myocardial infarction: Secondary | ICD-10-CM | POA: Diagnosis not present

## 2017-03-18 DIAGNOSIS — C7951 Secondary malignant neoplasm of bone: Secondary | ICD-10-CM | POA: Insufficient documentation

## 2017-03-18 DIAGNOSIS — Z7982 Long term (current) use of aspirin: Secondary | ICD-10-CM

## 2017-03-18 DIAGNOSIS — M5416 Radiculopathy, lumbar region: Secondary | ICD-10-CM | POA: Insufficient documentation

## 2017-03-18 DIAGNOSIS — I2510 Atherosclerotic heart disease of native coronary artery without angina pectoris: Secondary | ICD-10-CM | POA: Diagnosis not present

## 2017-03-18 DIAGNOSIS — C61 Malignant neoplasm of prostate: Secondary | ICD-10-CM | POA: Insufficient documentation

## 2017-03-18 DIAGNOSIS — M8580 Other specified disorders of bone density and structure, unspecified site: Secondary | ICD-10-CM | POA: Insufficient documentation

## 2017-03-18 LAB — CBC WITH DIFFERENTIAL/PLATELET
BASOS PCT: 0 %
Basophils Absolute: 0 10*3/uL (ref 0–0.1)
Eosinophils Absolute: 0 10*3/uL (ref 0–0.7)
Eosinophils Relative: 0 %
HEMATOCRIT: 40.8 % (ref 40.0–52.0)
Hemoglobin: 13.3 g/dL (ref 13.0–18.0)
LYMPHS ABS: 1.7 10*3/uL (ref 1.0–3.6)
LYMPHS PCT: 24 %
MCH: 27.3 pg (ref 26.0–34.0)
MCHC: 32.5 g/dL (ref 32.0–36.0)
MCV: 84.1 fL (ref 80.0–100.0)
MONO ABS: 0.4 10*3/uL (ref 0.2–1.0)
MONOS PCT: 6 %
NEUTROS ABS: 4.8 10*3/uL (ref 1.4–6.5)
NEUTROS PCT: 70 %
Platelets: 254 10*3/uL (ref 150–440)
RBC: 4.85 MIL/uL (ref 4.40–5.90)
RDW: 18.4 % — AB (ref 11.5–14.5)
WBC: 6.9 10*3/uL (ref 3.8–10.6)

## 2017-03-18 LAB — COMPREHENSIVE METABOLIC PANEL
ALT: 12 U/L — AB (ref 17–63)
AST: 16 U/L (ref 15–41)
Albumin: 4 g/dL (ref 3.5–5.0)
Alkaline Phosphatase: 91 U/L (ref 38–126)
Anion gap: 7 (ref 5–15)
BUN: 20 mg/dL (ref 6–20)
CALCIUM: 11.1 mg/dL — AB (ref 8.9–10.3)
CHLORIDE: 104 mmol/L (ref 101–111)
CO2: 29 mmol/L (ref 22–32)
CREATININE: 0.9 mg/dL (ref 0.61–1.24)
Glucose, Bld: 123 mg/dL — ABNORMAL HIGH (ref 65–99)
Potassium: 4.9 mmol/L (ref 3.5–5.1)
Sodium: 140 mmol/L (ref 135–145)
Total Bilirubin: 0.8 mg/dL (ref 0.3–1.2)
Total Protein: 7 g/dL (ref 6.5–8.1)

## 2017-03-18 LAB — PSA: PROSTATIC SPECIFIC ANTIGEN: 4.41 ng/mL — AB (ref 0.00–4.00)

## 2017-03-18 NOTE — Progress Notes (Signed)
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 He also has a past history of prostate cancer about 15 years ago(Gleasons score 8)and was treated with prostatectomy at that time. He did not receive any radiation treatment and does not remember if his PSA was subsequently monitored. PSA in March 2015 was 7.9 and in 2014 was 6.7 after prostatectomy. He was last seen by medical oncology Dr. Everlene Other in 2015 and at that time his PSA was 9.2 previously was supposed to follow-up after 2 months and ADT was supposed to be started at that time the patient was lost to follow-up.  He was seen by Dr. Golden Pop and his primary care doctor he saw until about 2 years ago. He has had ongoing chronic back pain and has had back surgery in the past but was having worsening back pain over the last few months and was seen by Dr. Raul Del.   MRI of the lumbar spine on 11/22/2016 revealed extensive osseous metastatic disease with pathologic compression fracture of the L3 vertebral body. Posteriorly displace fragments and metastatic soft tissue result in severe canal stenosis and compression of the thecal sac at this level.  CT chest abdomen and pelvis on 11/25/2016 did not reveal any evidence of metastatic disease other than his bones  Patient had decompression surgery of his L3vertebral body which  showed:Metastatic adenocarcinoma with features consistent with primary prostatic origin. See note.  Note: The patient history of previously diagnosed prostate carcinoma is noted. Immunoperoxidase stains were performed on formalin-fixed (non-decalcified) paraffin-embedded tissue in block A3 with appropriate positive controls. The tumor cells stain with CK8/18, PROSTATIC SPECIFIC ANTIGEN, PROSTATIC ACID PHOSPHATASE and P501S. The tumor cells do not stain with CYTOKERATIN 7, CYTOKERATIN 20, CK5/6, P40, GATA-3, CDX-2 or THYROID TRANSCRIPTION FACTOR (TTF-1). These findings are in support of the interpretation of metastatic adenocarcinoma with features consistent with primary prostatic origin.  Patient also has primary hyperparathyroidism and hypercalcemia secondary to it for which he sees Dr.Akhter from [facility] and has been referred to surgery for the same  Abiraterone was started for high risk castrate sensitive prostate cancer on 02/14/2017.  Baseline bone density showed osteopenia with a T score of -1.9 at the right femur neck.   Interval history-patient is tolerating his Zytiga well and reports no side effects.  Has occasional self-limited heart flashes.  Overall he feels well.  Back pain has improved and he is able to ambulate without a walker  ECOG PS- 1 Pain scale- 0 Opioid associated constipation- no  Review of systems- Review of Systems  Constitutional: Negative for chills, fever, malaise/fatigue and weight loss.  HENT: Negative for congestion, ear discharge and nosebleeds.   Eyes: Negative for blurred vision.  Respiratory: Negative for cough, hemoptysis, sputum production, shortness of breath and wheezing.   Cardiovascular: Negative for chest pain, palpitations, orthopnea and claudication.  Gastrointestinal: Negative for abdominal pain, blood in stool, constipation, diarrhea, heartburn, melena, nausea and vomiting.  Genitourinary: Negative for dysuria, flank pain, frequency, hematuria  and  urgency.  Musculoskeletal: Negative for back pain, joint pain and myalgias.  Skin: Negative for rash.  Neurological: Negative for dizziness, tingling, focal weakness, seizures, weakness and headaches.  Endo/Heme/Allergies: Does not bruise/bleed easily.  Psychiatric/Behavioral: Negative for depression and suicidal ideas. The patient does not have insomnia.        No Known Allergies   Past Medical History:  Diagnosis Date  . Arthritis   . Cancer [facility])    Prostate CA approximately 11 years ago  . Hyperlipidemia   . Hypertension   . Myocardial infarction [facility])      Past Surgical History:  Procedure Laterality Date  . APPENDECTOMY    . BACK SURGERY    . CARDIAC CATHETERIZATION     stent x 1  . CHOLECYSTECTOMY    . LUMBAR LAMINECTOMY/DECOMPRESSION MICRODISCECTOMY Left 08/20/2012   Procedure: LUMBAR LAMINECTOMY/DECOMPRESSION MICRODISCECTOMY 1 LEVEL;  Surgeon: Faythe Ghee, MD;  Location: [facility];  Service: Neurosurgery;  Laterality: Left;  lumbar four-five  . PROSTATE SURGERY    . TONSILLECTOMY      Social History   Socioeconomic History  . Marital status: Married    Spouse name: Not on file  . Number of children: Not on file  . Years of education: Not on file  . Highest education level: Not on file  Social Needs  . Financial resource strain: Not on file  . Food insecurity - worry: Not on file  . Food insecurity - inability: Not on file  . Transportation needs - medical: Not on file  . Transportation needs - non-medical: Not on file  Occupational History  . Not on file  Tobacco Use  . Smoking status: Never Smoker  . Smokeless tobacco: Never Used  Substance and Sexual Activity  . Alcohol use: No  . Drug use: No  . Sexual activity: Not on file  Other Topics Concern  . Not on file  Social History Narrative  . Not on file    No family history on file.   Current Outpatient Medications:  .  abiraterone acetate (ZYTIGA) 250 MG tablet, Take 4 tablets  (1,000 mg total) by mouth daily. Take on an empty stomach 1 hour before or 2 hours after a meal, Disp: 120 tablet, Rfl: 0 .  aspirin EC 81 MG tablet, Take 81 mg by mouth daily., Disp: , Rfl:  .  atorvastatin (LIPITOR) 10 MG tablet, Take 10 mg by mouth daily., Disp: , Rfl:  .  cholecalciferol (VITAMIN D) 1000 units tablet, Take 2,000 Units by mouth daily., Disp: , Rfl:  .  clopidogrel (PLAVIX) 75 MG tablet, Take 75 mg by mouth daily., Disp: , Rfl:  .  diltiazem (CARDIZEM SR) 120 MG 12 hr capsule, Take 120 mg by mouth daily. , Disp: , Rfl:  .  metoprolol tartrate (LOPRESSOR) 25 MG tablet, Take 50 mg by mouth 2 (two) times daily. , Disp: , Rfl:  .  nitroGLYCERIN (NITROSTAT) 0.4 MG SL tablet, As directed, Disp: , Rfl:  .  predniSONE (DELTASONE) 5 MG tablet, Take 1 tablet (5 mg total) daily with breakfast by mouth., Disp: 30 tablet, Rfl: 3  Physical exam:  Vitals:   03/18/17 1336 03/18/17 1342  BP: (!) 193/88 (!) 175/83  Resp: 18   Temp: (!) 96.7 F (35.9 C)   TempSrc: Tympanic   Weight: 188 lb (85.3 kg)    Physical Exam  Constitutional: He is oriented to person, place, and time and well-developed, well-nourished, and in no distress.  HENT:  Head: Normocephalic and atraumatic.  Eyes: EOM are normal. Pupils are equal, round, and reactive to light.  Neck: Normal range of motion.  Cardiovascular: Normal rate, regular rhythm and normal heart sounds.  Pulmonary/Chest: Effort normal and breath sounds normal.  Abdominal: Soft. Bowel sounds are normal.  Neurological: He is alert and oriented to person, place, and time.  Skin: Skin is warm and dry.     CMP Latest Ref Rng & Units 03/18/2017  Glucose 65 - 99 mg/dL 123(H)  BUN 6 - 20 mg/dL 20  Creatinine 0.61 - 1.24 mg/dL 0.90  Sodium 135 - 145 mmol/L 140  Potassium 3.5 - 5.1 mmol/L 4.9  Chloride 101 - 111 mmol/L 104  CO2 22 - 32 mmol/L 29  Calcium 8.9 - 10.3 mg/dL 11.1(H)  Total Protein 6.5 - 8.1 g/dL 7.0  Total Bilirubin 0.3 - 1.2 mg/dL  0.8  Alkaline Phos 38 - 126 U/L 91  AST 15 - 41 U/L 16  ALT 17 - 63 U/L 12(L)   CBC Latest Ref Rng & Units 03/18/2017  WBC 3.8 - 10.6 K/uL 6.9  Hemoglobin 13.0 - 18.0 g/dL 13.3  Hematocrit 40.0 - 52.0 % 40.8  Platelets 150 - 440 K/uL 254      Assessment and plan- Patient is a 82 y.o. male with high risk castrate sensitive prostate cancer with bone only mets on abiraterone  High risk castrate sensitive prostate cancer: Patient is tolerating his abiraterone well and will continue that every day along with prednisone.  He is due for his Lupron next month.  I will see him back in 1 month's time with CBC CMP and PSA and serum testosterone level Osteopenia: Patient did receive 1 dose of pamidronate recently when he was hospitalized for hypercalcemia.  I will hold off on further doses every 6 months given his upcoming surgery for hyperparathyroidism  Primary hyperparathyroidism: Being followed by Dr. Paticia Stack.  He will inform her about a calcium level of 11.1 today.  Patient is also been seen by Dr. Cena Benton and is scheduled to undergo parathyroidectomy next month.   Visit Diagnosis 1. Prostate cancer metastatic to bone ([place])   2. High risk medication use   3. Osteopenia, unspecified location      Dr. Randa Evens, MD, MPH [facility] at [facility] Pager- [phone] 03/18/2017 2:51 PM

## 2017-03-18 NOTE — Progress Notes (Signed)
Patient here for follow up with labs today and possible xgeva. He states that he is feeling well except for some chronic lower back pain.

## 2017-03-19 LAB — TESTOSTERONE: Testosterone: <3 ng/dL — ABNORMAL LOW (ref 264–916)

## 2017-04-14 ENCOUNTER — Other Ambulatory Visit: Payer: Self-pay | Admitting: *Deleted

## 2017-04-14 DIAGNOSIS — C61 Malignant neoplasm of prostate: Secondary | ICD-10-CM

## 2017-04-14 DIAGNOSIS — C7951 Secondary malignant neoplasm of bone: Principal | ICD-10-CM

## 2017-04-14 MED ORDER — ABIRATERONE ACETATE 250 MG PO TABS: 1000.0000 mg | ORAL_TABLET | Freq: Every day | ORAL | 0 refills | Status: DC

## 2017-04-17 ENCOUNTER — Telehealth: Payer: Self-pay | Admitting: *Deleted

## 2017-04-17 DIAGNOSIS — C61 Malignant neoplasm of prostate: Secondary | ICD-10-CM

## 2017-04-17 DIAGNOSIS — C7951 Secondary malignant neoplasm of bone: Principal | ICD-10-CM

## 2017-04-17 MED ORDER — ABIRATERONE ACETATE 250 MG PO TABS: 1000.0000 mg | ORAL_TABLET | Freq: Every day | ORAL | 0 refills | Status: DC

## 2017-04-17 NOTE — Telephone Encounter (Signed)
Medication had been refill on 1/14, but was sent to [facility] not [facility]. It has been resent to correct pharmacy

## 2017-04-22 ENCOUNTER — Inpatient Hospital Stay (HOSPITAL_BASED_OUTPATIENT_CLINIC_OR_DEPARTMENT_OTHER): Payer: Medicare Other | Admitting: Nurse Practitioner

## 2017-04-22 ENCOUNTER — Telehealth: Payer: Self-pay | Admitting: Oncology

## 2017-04-22 ENCOUNTER — Encounter (INDEPENDENT_AMBULATORY_CARE_PROVIDER_SITE_OTHER): Payer: Self-pay

## 2017-04-22 ENCOUNTER — Inpatient Hospital Stay: Payer: Medicare Other | Attending: Oncology

## 2017-04-22 ENCOUNTER — Inpatient Hospital Stay: Payer: Medicare Other

## 2017-04-22 ENCOUNTER — Encounter: Payer: Self-pay | Admitting: Nurse Practitioner

## 2017-04-22 VITALS — BP 143/74 | HR 56 | Temp 98.2°F | Resp 18 | Ht 70.0 in | Wt 192.6 lb

## 2017-04-22 DIAGNOSIS — C7951 Secondary malignant neoplasm of bone: Principal | ICD-10-CM

## 2017-04-22 DIAGNOSIS — C61 Malignant neoplasm of prostate: Secondary | ICD-10-CM

## 2017-04-22 DIAGNOSIS — Z5111 Encounter for antineoplastic chemotherapy: Secondary | ICD-10-CM | POA: Insufficient documentation

## 2017-04-22 DIAGNOSIS — M8580 Other specified disorders of bone density and structure, unspecified site: Secondary | ICD-10-CM | POA: Insufficient documentation

## 2017-04-22 DIAGNOSIS — E213 Hyperparathyroidism, unspecified: Secondary | ICD-10-CM | POA: Diagnosis not present

## 2017-04-22 DIAGNOSIS — M545 Low back pain: Secondary | ICD-10-CM

## 2017-04-22 LAB — COMPREHENSIVE METABOLIC PANEL
ALK PHOS: 83 U/L (ref 38–126)
ALT: 14 U/L — ABNORMAL LOW (ref 17–63)
ANION GAP: 6 (ref 5–15)
AST: 19 U/L (ref 15–41)
Albumin: 4 g/dL (ref 3.5–5.0)
BILIRUBIN TOTAL: 0.7 mg/dL (ref 0.3–1.2)
BUN: 23 mg/dL — ABNORMAL HIGH (ref 6–20)
CALCIUM: 10.8 mg/dL — AB (ref 8.9–10.3)
CO2: 28 mmol/L (ref 22–32)
Chloride: 104 mmol/L (ref 101–111)
Creatinine, Ser: 0.9 mg/dL (ref 0.61–1.24)
GLUCOSE: 120 mg/dL — AB (ref 65–99)
Potassium: 4.2 mmol/L (ref 3.5–5.1)
Sodium: 138 mmol/L (ref 135–145)
TOTAL PROTEIN: 7 g/dL (ref 6.5–8.1)

## 2017-04-22 LAB — CBC WITH DIFFERENTIAL/PLATELET
BASOS ABS: 0 10*3/uL (ref 0–0.1)
BASOS PCT: 1 %
Eosinophils Absolute: 0 10*3/uL (ref 0–0.7)
Eosinophils Relative: 1 %
HEMATOCRIT: 39.7 % — AB (ref 40.0–52.0)
Hemoglobin: 12.9 g/dL — ABNORMAL LOW (ref 13.0–18.0)
Lymphocytes Relative: 17 %
Lymphs Abs: 0.9 10*3/uL — ABNORMAL LOW (ref 1.0–3.6)
MCH: 27.4 pg (ref 26.0–34.0)
MCHC: 32.5 g/dL (ref 32.0–36.0)
MCV: 84.2 fL (ref 80.0–100.0)
MONO ABS: 0.3 10*3/uL (ref 0.2–1.0)
Monocytes Relative: 7 %
NEUTROS ABS: 3.7 10*3/uL (ref 1.4–6.5)
NEUTROS PCT: 74 %
Platelets: 249 10*3/uL (ref 150–440)
RBC: 4.72 MIL/uL (ref 4.40–5.90)
RDW: 19.2 % — ABNORMAL HIGH (ref 11.5–14.5)
WBC: 5 10*3/uL (ref 3.8–10.6)

## 2017-04-22 MED ORDER — ABIRATERONE ACETATE 250 MG PO TABS: 1000.0000 mg | ORAL_TABLET | Freq: Every day | ORAL | 3 refills | Status: DC

## 2017-04-22 MED ORDER — LEUPROLIDE ACETATE (3 MONTH) 22.5 MG IM KIT
22.5000 mg | PACK | INTRAMUSCULAR | Status: DC
  Administered 2017-04-22: 22.5 mg via INTRAMUSCULAR
  Filled 2017-04-22: qty 22.5

## 2017-04-22 NOTE — Telephone Encounter (Signed)
Oral Oncology Patient Advocate Encounter  Went to see patient in exam room. Patient stated he was having problems getting his Zytiga on time. I told patient to please call 7 to 10 days in advance to [facility].  Call us when he needs refill in advance that way he always has his Zytiga.    [place] Patient Advocate [phone] 04/22/2017 2:50 PM

## 2017-04-22 NOTE — Progress Notes (Signed)
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 He also has a past history of prostate cancer about 15 years ago(Gleasons score 8)and was treated with prostatectomy at that time. He did not receive any radiation treatment and does not remember if his PSA was subsequently monitored. PSA in March 2015 was 7.9 and in 2014 was 6.7 after prostatectomy. He was last seen by medical oncology Dr. Everlene Other in 2015 and at that time his PSA was 9.2 previously was supposed to follow-up after 2 months and ADT was supposed to be started at that time the patient was lost to follow-up.  He was seen by Dr. Golden Pop and his primary care doctor he saw until about 2 years ago. He has had ongoing chronic back pain and has had back surgery in the past but was having worsening back pain over the last few months and was seen by Dr. Raul Del.   MRI of the lumbar spine on 11/22/2016 revealed extensive osseous metastatic disease with pathologic compression fracture of the L3 vertebral body. Posteriorly displace fragments and metastatic soft tissue result in severe canal stenosis and compression of the thecal sac at this level.  CT chest abdomen and pelvis on 11/25/2016 did not reveal any evidence of metastatic disease other than his bones  Patient had decompression surgery of his L3vertebral body which showed:Metastatic  adenocarcinoma with features consistent with primary prostatic origin. See note.  Note: The patient history of previously diagnosed prostate carcinoma is noted. Immunoperoxidase stains were performed on formalin-fixed (non-decalcified) paraffin-embedded tissue in block A3 with appropriate positive controls. The tumor cells stain with CK8/18, PROSTATIC SPECIFIC ANTIGEN, PROSTATIC ACID PHOSPHATASE and P501S. The tumor cells do not stain with CYTOKERATIN 7, CYTOKERATIN 20, CK5/6, P40, GATA-3, CDX-2 or THYROID TRANSCRIPTION FACTOR (TTF-1). These findings are in support of the interpretation of metastatic adenocarcinoma with features consistent with primary prostatic origin.  Patient also has primary hyperparathyroidism and hypercalcemia secondary to it for which he sees Dr.Akhter from [facility] and has been referred to surgery for the same  Abiraterone was started for high risk castrate sensitive prostate cancer on 02/14/2017.  Baseline bone density showed osteopenia with a T score of -1.9 at the right femur neck.   Interval history-  Patient reports feeling well. Denies side effects of Zytiga. Hot flashes have improved. Back pain continues to bother him but he states he ambulates short distances w/o problems. Continues steroids. Reports some trouble getting Zytiga from pharmacy. States he missed 1 dose d/t this.  Saw cardiology this morning who is adjusting BP medication in coordination with Endocrinology. Says he has a CT of his thyroid/parathyroid scheduled and was started on Sensipar. He says that surgery is not planned at this time.   ECOG PS- 1 Pain scale- 0 Opioid associated constipation- n/a  Review of systems- Review of Systems  Constitutional: Negative for chills, fever, malaise/fatigue and weight loss.  HENT: Negative for congestion, ear discharge and nosebleeds.   Eyes: Negative for blurred vision.  Respiratory:  Positive for shortness of breath (with exertion). Negative for cough,  hemoptysis, sputum production and wheezing.   Cardiovascular: Negative for chest pain, palpitations, orthopnea and claudication.  Gastrointestinal: Negative for abdominal pain, blood in stool, constipation, diarrhea, heartburn, melena, nausea and vomiting.  Genitourinary: Negative for dysuria, flank pain, frequency, hematuria and urgency.  Musculoskeletal: Positive for back pain and joint pain. Negative for myalgias.  Skin: Negative for rash.  Neurological: Positive for tingling. Negative for dizziness, focal weakness, seizures, weakness and headaches.  Endo/Heme/Allergies: Does not bruise/bleed easily.  Psychiatric/Behavioral: Negative for depression and suicidal ideas. The patient does not have insomnia.      No Known Allergies   Past Medical History:  Diagnosis Date  . Arthritis   . Cancer [facility])    Prostate CA approximately 11 years ago  . Hyperlipidemia   . Hypertension   . Myocardial infarction [facility])      Past Surgical History:  Procedure Laterality Date  . APPENDECTOMY    . BACK SURGERY    . CARDIAC CATHETERIZATION     stent x 1  . CHOLECYSTECTOMY    . LUMBAR LAMINECTOMY/DECOMPRESSION MICRODISCECTOMY Left 08/20/2012   Procedure: LUMBAR LAMINECTOMY/DECOMPRESSION MICRODISCECTOMY 1 LEVEL;  Surgeon: Faythe Ghee, MD;  Location: [facility];  Service: Neurosurgery;  Laterality: Left;  lumbar four-five  . PROSTATE SURGERY    . TONSILLECTOMY      Social History   Socioeconomic History  . Marital status: Married    Spouse name: Not on file  . Number of children: Not on file  . Years of education: Not on file  . Highest education level: Not on file  Social Needs  . Financial resource strain: Not on file  . Food insecurity - worry: Not on file  . Food insecurity - inability: Not on file  . Transportation needs - medical: Not on file  . Transportation needs - non-medical: Not on file  Occupational History  . Not on file  Tobacco Use  . Smoking status: Never Smoker   . Smokeless tobacco: Never Used  Substance and Sexual Activity  . Alcohol use: No  . Drug use: No  . Sexual activity: Not on file  Other Topics Concern  . Not on file  Social History Narrative  . Not on file    No family history on file.   Current Outpatient Medications:  .  aspirin EC 81 MG tablet, Take 81 mg by mouth daily., Disp: , Rfl:  .  atorvastatin (LIPITOR) 10 MG tablet, Take 10 mg by mouth daily., Disp: , Rfl:  .  cholecalciferol (VITAMIN D) 1000 units tablet, Take 2,000 Units by mouth daily., Disp: , Rfl:  .  clopidogrel (PLAVIX) 75 MG tablet, Take 75 mg by mouth daily., Disp: , Rfl:  .  diltiazem (CARDIZEM SR) 120 MG 12 hr capsule, Take 120 mg by mouth daily. , Disp: , Rfl:  .  hydrALAZINE (APRESOLINE) 25 MG tablet, Take 25 mg by mouth 3 (three) times daily., Disp: , Rfl:  .  metoprolol tartrate (LOPRESSOR) 25 MG tablet, Take 50 mg by mouth 2 (two) times daily. , Disp: , Rfl:  .  nitroGLYCERIN (NITROSTAT) 0.4 MG SL tablet, As directed, Disp: , Rfl:  .  predniSONE (DELTASONE) 5 MG tablet, Take 1 tablet (5 mg total) daily with breakfast by mouth., Disp: 30 tablet, Rfl: 3 .  abiraterone acetate (ZYTIGA) 250 MG tablet, Take 4 tablets (1,000 mg total) by mouth daily. Take on an empty stomach 1 hour  before or 2 hours after a meal, Disp: 120 tablet, Rfl: 3  Physical exam:  Vitals:   04/22/17 1442  BP: (!) 143/74  Pulse: (!) 56  Resp: 18  Temp: 98.2 F (36.8 C)  TempSrc: Oral  Weight: 192 lb 9.6 oz (87.4 kg)  Height: 5\' 10"  (1.778 m)   Physical Exam  Constitutional: He is oriented to person, place, and time and well-developed, well-nourished, and in no distress.  Accompanied by family  HENT:  Head: Normocephalic and atraumatic.  Eyes: EOM are normal. Pupils are equal, round, and reactive to light.  Neck: Normal range of motion.  Cardiovascular: Normal rate, regular rhythm and normal heart sounds.  Pulmonary/Chest: Effort normal and breath sounds normal.  1+ ble  edema  Abdominal: Soft. Bowel sounds are normal.  Musculoskeletal:  Impaired ambulation  Neurological: He is alert and oriented to person, place, and time.  Skin: Skin is warm and dry.  Psychiatric: Affect normal.     CMP Latest Ref Rng & Units 04/22/2017  Glucose 65 - 99 mg/dL 120(H)  BUN 6 - 20 mg/dL 23(H)  Creatinine 0.61 - 1.24 mg/dL 0.90  Sodium 135 - 145 mmol/L 138  Potassium 3.5 - 5.1 mmol/L 4.2  Chloride 101 - 111 mmol/L 104  CO2 22 - 32 mmol/L 28  Calcium 8.9 - 10.3 mg/dL 10.8(H)  Total Protein 6.5 - 8.1 g/dL 7.0  Total Bilirubin 0.3 - 1.2 mg/dL 0.7  Alkaline Phos 38 - 126 U/L 83  AST 15 - 41 U/L 19  ALT 17 - 63 U/L 14(L)   CBC Latest Ref Rng & Units 04/22/2017  WBC 3.8 - 10.6 K/uL 5.0  Hemoglobin 13.0 - 18.0 g/dL 12.9(L)  Hematocrit 40.0 - 52.0 % 39.7(L)  Platelets 150 - 440 K/uL 249    Lab Results  Component Value Date   TESTOSTERONE <3 (L) 04/22/2017    Lab Results  Component Value Date   Prostatic Specific Antigen 0.95 04/22/2017   Prostatic Specific Antigen 4.41 (H) 03/18/2017   Prostatic Specific Antigen 20.15 (H) 02/14/2017   Prostatic Specific Antigen 252.00 (H) 01/02/2017   PSA 7.9 (H) 06/22/2013   PSA 6.7 (H) 09/17/2012   PSA 6.0 (H) 07/30/2012      Assessment and plan- Patient is a 83 y.o. male with high risk castrate sensitive prostate cancer with bone only mets on abiraterone.  High risk castrate sensitive prostate cancer: Patient is tolerating his abiraterone well and will continue that every day along with prednisone. PSA trending down, today PSA- 0.95. Testosterone <3. Plan for him to received Lupron today and again in 3 months (April 2019). Will plan for him to return to clinic in 1 month's time with CBC CMP and PSA and serum testosterone level.   Osteopenia: Baseline bone density scan in October 2018 showed osteopenia. Previously discussed Xgeva every 6 months but patient would require dental clearance; currently poor dentition. Received 1  dose of Zometa while in hospital for hypercalcemia (02/18/2017). Could re-consider in the future.   Primary hyperparathyroidism: Being followed by Dr. Paticia Stack. He will inform her about a calcium level of 10.8 today.  Patient says plan for CT of thyroid/parathyroid planned and he has just started Sensipar. Surgery currently on hold pending response to Sensipar. Additionally patient wishes to avoid general anesthesia if possible d/t prior hx of complications. Continue to monitor.   RTC in 1 month for labs and further evaluation.    Visit Diagnosis 1. Prostate cancer metastatic to bone [facility])  Beckey Rutter, DNP, AGNP-C [place] at [facility] [phone] (office) 04/24/17 9:46 AM

## 2017-04-23 LAB — PSA: PROSTATIC SPECIFIC ANTIGEN: 0.95 ng/mL (ref 0.00–4.00)

## 2017-04-23 LAB — TESTOSTERONE

## 2017-04-24 ENCOUNTER — Other Ambulatory Visit: Payer: Self-pay | Admitting: Pharmacist

## 2017-04-24 DIAGNOSIS — C7951 Secondary malignant neoplasm of bone: Principal | ICD-10-CM

## 2017-04-24 DIAGNOSIS — C61 Malignant neoplasm of prostate: Secondary | ICD-10-CM

## 2017-04-24 MED ORDER — ABIRATERONE ACETATE 250 MG PO TABS: 1000.0000 mg | ORAL_TABLET | Freq: Every day | ORAL | 3 refills | Status: DC

## 2017-04-29 ENCOUNTER — Encounter: Payer: Self-pay | Admitting: Nurse Practitioner

## 2017-05-19 ENCOUNTER — Other Ambulatory Visit: Payer: Self-pay | Admitting: Oncology

## 2017-05-19 DIAGNOSIS — C61 Malignant neoplasm of prostate: Secondary | ICD-10-CM

## 2017-05-19 DIAGNOSIS — C7951 Secondary malignant neoplasm of bone: Principal | ICD-10-CM

## 2017-05-20 ENCOUNTER — Inpatient Hospital Stay (HOSPITAL_BASED_OUTPATIENT_CLINIC_OR_DEPARTMENT_OTHER): Payer: Medicare Other | Admitting: Oncology

## 2017-05-20 ENCOUNTER — Other Ambulatory Visit: Payer: Self-pay | Admitting: *Deleted

## 2017-05-20 ENCOUNTER — Inpatient Hospital Stay: Payer: Medicare Other | Attending: Oncology

## 2017-05-20 ENCOUNTER — Encounter: Payer: Self-pay | Admitting: Oncology

## 2017-05-20 VITALS — BP 157/77 | HR 47 | Temp 98.0°F | Resp 18 | Ht 70.0 in | Wt 197.3 lb

## 2017-05-20 DIAGNOSIS — C61 Malignant neoplasm of prostate: Secondary | ICD-10-CM | POA: Diagnosis not present

## 2017-05-20 DIAGNOSIS — C7951 Secondary malignant neoplasm of bone: Secondary | ICD-10-CM | POA: Insufficient documentation

## 2017-05-20 DIAGNOSIS — Z79899 Other long term (current) drug therapy: Secondary | ICD-10-CM | POA: Diagnosis not present

## 2017-05-20 LAB — COMPREHENSIVE METABOLIC PANEL
ALT: 11 U/L — ABNORMAL LOW (ref 17–63)
AST: 17 U/L (ref 15–41)
Albumin: 3.7 g/dL (ref 3.5–5.0)
Alkaline Phosphatase: 77 U/L (ref 38–126)
Anion gap: 5 (ref 5–15)
BILIRUBIN TOTAL: 0.6 mg/dL (ref 0.3–1.2)
BUN: 18 mg/dL (ref 6–20)
CO2: 28 mmol/L (ref 22–32)
CREATININE: 0.98 mg/dL (ref 0.61–1.24)
Calcium: 10.8 mg/dL — ABNORMAL HIGH (ref 8.9–10.3)
Chloride: 107 mmol/L (ref 101–111)
Glucose, Bld: 135 mg/dL — ABNORMAL HIGH (ref 65–99)
POTASSIUM: 4.1 mmol/L (ref 3.5–5.1)
Sodium: 140 mmol/L (ref 135–145)
TOTAL PROTEIN: 6.4 g/dL — AB (ref 6.5–8.1)

## 2017-05-20 LAB — PSA: PROSTATIC SPECIFIC ANTIGEN: 0.53 ng/mL (ref 0.00–4.00)

## 2017-05-20 LAB — CBC WITH DIFFERENTIAL/PLATELET
BASOS ABS: 0 10*3/uL (ref 0–0.1)
Basophils Relative: 1 %
EOS ABS: 0.1 10*3/uL (ref 0–0.7)
EOS PCT: 1 %
HCT: 36.2 % — ABNORMAL LOW (ref 40.0–52.0)
Hemoglobin: 12.1 g/dL — ABNORMAL LOW (ref 13.0–18.0)
LYMPHS PCT: 20 %
Lymphs Abs: 1.2 10*3/uL (ref 1.0–3.6)
MCH: 28.7 pg (ref 26.0–34.0)
MCHC: 33.5 g/dL (ref 32.0–36.0)
MCV: 85.7 fL (ref 80.0–100.0)
Monocytes Absolute: 0.4 10*3/uL (ref 0.2–1.0)
Monocytes Relative: 7 %
Neutro Abs: 4.4 10*3/uL (ref 1.4–6.5)
Neutrophils Relative %: 71 %
PLATELETS: 229 10*3/uL (ref 150–440)
RBC: 4.22 MIL/uL — AB (ref 4.40–5.90)
RDW: 17.1 % — ABNORMAL HIGH (ref 11.5–14.5)
WBC: 6.1 10*3/uL (ref 3.8–10.6)

## 2017-05-20 NOTE — Progress Notes (Signed)
No new changes noted today 

## 2017-05-20 NOTE — Telephone Encounter (Signed)
This has been refill this morning already

## 2017-05-20 NOTE — Progress Notes (Signed)
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 He also has a past history of prostate cancer about 15 years ago(Gleasons score 8)and was treated with prostatectomy at that time. He did not receive any radiation treatment and does not remember if his PSA was subsequently monitored. PSA in March 2015 was 7.9 and in 2014 was 6.7 after prostatectomy. He was last seen by medical oncology Dr. Everlene Other in 2015 and at that time his PSA was 9.2 previously was supposed to follow-up after 2 months and ADT was supposed to be started at that time the patient was lost to follow-up.  He was seen by Dr. Golden Pop and his primary care doctor he saw until about 2 years ago. He has had ongoing chronic back pain and has had back surgery in the past but was having worsening back pain over the last few months and was seen by Dr. Raul Del.   MRI of the lumbar spine on 11/22/2016 revealed extensive osseous metastatic disease with pathologic compression fracture of the L3 vertebral body. Posteriorly displace fragments and metastatic soft tissue result in severe canal stenosis and compression of the thecal sac at this level.  CT chest abdomen and pelvis on 11/25/2016 did not reveal any evidence of metastatic disease other than his bones  Patient had decompression surgery of his  L3vertebral body which showed:Metastatic adenocarcinoma with features consistent with primary prostatic origin. See note.  Note: The patient history of previously diagnosed prostate carcinoma is noted. Immunoperoxidase stains were performed on formalin-fixed (non-decalcified) paraffin-embedded tissue in block A3 with appropriate positive controls. The tumor cells stain with CK8/18, PROSTATIC SPECIFIC ANTIGEN, PROSTATIC ACID PHOSPHATASE and P501S. The tumor cells do not stain with CYTOKERATIN 7, CYTOKERATIN 20, CK5/6, P40, GATA-3, CDX-2 or THYROID TRANSCRIPTION FACTOR (TTF-1). These findings are in support of the interpretation of metastatic adenocarcinoma with features consistent with primary prostatic origin.  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  Baseline bone density showed osteopenia with a T score of -1.9 at the right femur neck.    Interval history-patient is tolerating his Zytiga well and reports no significant side effects.  He is ambulating well without the help of a walker or cane.  Denies any pain.  He was taken off hydrochlorothiazide given his hypercalcemia and reports some leg swelling since then  ECOG PS- 1 Pain scale- 0   Review of systems- Review of Systems  Constitutional: Negative for chills, fever, malaise/fatigue and weight loss.  HENT: Negative for congestion, ear discharge and nosebleeds.   Eyes: Negative for blurred vision.  Respiratory: Negative for cough, hemoptysis, sputum production, shortness of breath and wheezing.   Cardiovascular: Positive for leg swelling. Negative for chest pain, palpitations, orthopnea and claudication.  Gastrointestinal: Negative for abdominal pain,  blood in stool, constipation, diarrhea, heartburn, melena, nausea and vomiting.   Genitourinary: Negative for dysuria, flank pain, frequency, hematuria and urgency.  Musculoskeletal: Negative for back pain, joint pain and myalgias.  Skin: Negative for rash.  Neurological: Negative for dizziness, tingling, focal weakness, seizures, weakness and headaches.  Endo/Heme/Allergies: Does not bruise/bleed easily.  Psychiatric/Behavioral: Negative for depression and suicidal ideas. The patient does not have insomnia.       No Known Allergies   Past Medical History:  Diagnosis Date  . Arthritis   . Cancer [facility])    Prostate CA approximately 11 years ago  . Hyperlipidemia   . Hypertension   . Myocardial infarction [facility])      Past Surgical History:  Procedure Laterality Date  . APPENDECTOMY    . BACK SURGERY    . CARDIAC CATHETERIZATION     stent x 1  . CHOLECYSTECTOMY    . LUMBAR LAMINECTOMY/DECOMPRESSION MICRODISCECTOMY Left 08/20/2012   Procedure: LUMBAR LAMINECTOMY/DECOMPRESSION MICRODISCECTOMY 1 LEVEL;  Surgeon: Faythe Ghee, MD;  Location: [facility];  Service: Neurosurgery;  Laterality: Left;  lumbar four-five  . PROSTATE SURGERY    . TONSILLECTOMY      Social History   Socioeconomic History  . Marital status: Married    Spouse name: Not on file  . Number of children: Not on file  . Years of education: Not on file  . Highest education level: Not on file  Social Needs  . Financial resource strain: Not on file  . Food insecurity - worry: Not on file  . Food insecurity - inability: Not on file  . Transportation needs - medical: Not on file  . Transportation needs - non-medical: Not on file  Occupational History  . Not on file  Tobacco Use  . Smoking status: Never Smoker  . Smokeless tobacco: Never Used  Substance and Sexual Activity  . Alcohol use: No  . Drug use: No  . Sexual activity: Not on file  Other Topics Concern  . Not on file  Social History Narrative  . Not on file    Family History  Problem Relation Age of Onset  . Stroke  Mother   . Heart attack Father      Current Outpatient Medications:  .  aspirin EC 81 MG tablet, Take 81 mg by mouth daily., Disp: , Rfl:  .  atorvastatin (LIPITOR) 10 MG tablet, Take 10 mg by mouth daily., Disp: , Rfl:  .  cholecalciferol (VITAMIN D) 1000 units tablet, Take 2,000 Units by mouth daily., Disp: , Rfl:  .  clopidogrel (PLAVIX) 75 MG tablet, Take 75 mg by mouth daily., Disp: , Rfl:  .  diltiazem (CARDIZEM SR) 120 MG 12 hr capsule, Take 120 mg by mouth daily. , Disp: , Rfl:  .  hydrALAZINE (APRESOLINE) 25 MG tablet, Take 25 mg by mouth 3 (three) times daily., Disp: , Rfl:  .  metoprolol tartrate (LOPRESSOR) 25 MG tablet, Take 50 mg by mouth 2 (two) times daily. , Disp: , Rfl:  .  predniSONE (DELTASONE) 5 MG tablet, Take 1 tablet (5 mg total) daily with breakfast by mouth., Disp: 30 tablet, Rfl: 3 .  ZYTIGA 250 MG tablet, TAKE 4 TABLETS BY MOUTH ONCE DAILY AS DIRECTED.  TAKE 1 HOUR BEFORE OR 2 HOURS AFTER A MEAL, Disp: 120 tablet, Rfl: 11 .  nitroGLYCERIN (NITROSTAT) 0.4 MG SL tablet, As directed, Disp: , Rfl:   Physical exam:  Vitals:   05/20/17 1410  BP: Marland Kitchen)  157/77  Pulse: (!) 47  Resp: 18  Temp: 98 F (36.7 C)  TempSrc: Oral  Weight: 197 lb 4.8 oz (89.5 kg)  Height: 5\' 10"  (1.778 m)   Physical Exam  Constitutional: He is oriented to person, place, and time and well-developed, well-nourished, and in no distress.  HENT:  Head: Normocephalic and atraumatic.  Eyes: EOM are normal. Pupils are equal, round, and reactive to light.  Neck: Normal range of motion.  Cardiovascular: Normal rate, regular rhythm and normal heart sounds.  Pulmonary/Chest: Effort normal and breath sounds normal.  Abdominal: Soft. Bowel sounds are normal.  Musculoskeletal: He exhibits no edema (Bilateral trace).  Neurological: He is alert and oriented to person, place, and time.  Skin: Skin is warm and dry.     CMP Latest Ref Rng & Units 05/20/2017  Glucose 65 - 99 mg/dL 135(H)  BUN 6 - 20  mg/dL 18  Creatinine 0.61 - 1.24 mg/dL 0.98  Sodium 135 - 145 mmol/L 140  Potassium 3.5 - 5.1 mmol/L 4.1  Chloride 101 - 111 mmol/L 107  CO2 22 - 32 mmol/L 28  Calcium 8.9 - 10.3 mg/dL 10.8(H)  Total Protein 6.5 - 8.1 g/dL 6.4(L)  Total Bilirubin 0.3 - 1.2 mg/dL 0.6  Alkaline Phos 38 - 126 U/L 77  AST 15 - 41 U/L 17  ALT 17 - 63 U/L 11(L)   CBC Latest Ref Rng & Units 05/20/2017  WBC 3.8 - 10.6 K/uL 6.1  Hemoglobin 13.0 - 18.0 g/dL 12.1(L)  Hematocrit 40.0 - 52.0 % 36.2(L)  Platelets 150 - 440 K/uL 229      Assessment and plan- Patient is a 83 y.o. male with high risk castrate sensitive metastatic prostate cancer with bone metastases  Patient's last dose of Lupron was in January and he will get his next dose in April 2019.  Patient is currently tolerating his Zytiga well and will continue until progression or toxicity.  His PSA has responded well to treatment and down to 0.95 from a baseline of 252.  I will plan to get a bone scan prior to his next visit  I will see him back in 1 month's time with CBC CMP and PSA   hypercalcemia: Probably secondary to hyperparathyroidism.  Being followed by endocrinology at [facility].  Calcium is stable high at 10.8 with a normal albumin     Visit Diagnosis 1. Prostate cancer metastatic to bone ([place])   2. High risk medication use      Dr. Randa Evens, MD, MPH [facility] at [facility] Pager- [phone] 05/20/2017 2:57 PM

## 2017-05-21 LAB — TESTOSTERONE

## 2017-06-12 ENCOUNTER — Encounter
Admission: RE | Admit: 2017-06-12 | Discharge: 2017-06-12 | Disposition: A | Discharge status: Home / Self Care | Payer: Medicare Other | Source: Ambulatory Visit | Attending: Oncology | Admitting: Oncology

## 2017-06-12 DIAGNOSIS — C61 Malignant neoplasm of prostate: Secondary | ICD-10-CM | POA: Insufficient documentation

## 2017-06-12 DIAGNOSIS — C7951 Secondary malignant neoplasm of bone: Secondary | ICD-10-CM | POA: Diagnosis present

## 2017-06-12 MED ORDER — TECHNETIUM TC 99M MEDRONATE IV KIT
25.0000 | PACK | Freq: Once | INTRAVENOUS | Status: AC | PRN
  Administered 2017-06-12: 20.93 via INTRAVENOUS

## 2017-06-14 ENCOUNTER — Other Ambulatory Visit: Payer: Self-pay | Admitting: Oncology

## 2017-06-17 ENCOUNTER — Inpatient Hospital Stay: Payer: Medicare Other | Attending: Oncology | Admitting: Oncology

## 2017-06-17 ENCOUNTER — Encounter: Payer: Self-pay | Admitting: Oncology

## 2017-06-17 ENCOUNTER — Inpatient Hospital Stay: Payer: Medicare Other

## 2017-06-17 VITALS — BP 140/72 | HR 48 | Temp 97.0°F | Resp 18 | Ht 70.0 in | Wt 202.4 lb

## 2017-06-17 DIAGNOSIS — Z79899 Other long term (current) drug therapy: Secondary | ICD-10-CM

## 2017-06-17 DIAGNOSIS — R600 Localized edema: Secondary | ICD-10-CM

## 2017-06-17 DIAGNOSIS — R609 Edema, unspecified: Secondary | ICD-10-CM | POA: Diagnosis not present

## 2017-06-17 DIAGNOSIS — C7951 Secondary malignant neoplasm of bone: Secondary | ICD-10-CM

## 2017-06-17 DIAGNOSIS — C61 Malignant neoplasm of prostate: Secondary | ICD-10-CM | POA: Diagnosis not present

## 2017-06-17 LAB — PSA: Prostatic Specific Antigen: 0.36 ng/mL (ref 0.00–4.00)

## 2017-06-17 LAB — COMPREHENSIVE METABOLIC PANEL
ALBUMIN: 3.6 g/dL (ref 3.5–5.0)
ALK PHOS: 71 U/L (ref 38–126)
ALT: 11 U/L — ABNORMAL LOW (ref 17–63)
ANION GAP: 8 (ref 5–15)
AST: 16 U/L (ref 15–41)
BILIRUBIN TOTAL: 0.5 mg/dL (ref 0.3–1.2)
BUN: 18 mg/dL (ref 6–20)
CALCIUM: 10.8 mg/dL — AB (ref 8.9–10.3)
CO2: 27 mmol/L (ref 22–32)
Chloride: 106 mmol/L (ref 101–111)
Creatinine, Ser: 0.91 mg/dL (ref 0.61–1.24)
GFR calc non Af Amer: >60 mL/min (ref >60)
GLUCOSE: 124 mg/dL — AB (ref 65–99)
Potassium: 4.4 mmol/L (ref 3.5–5.1)
Sodium: 141 mmol/L (ref 135–145)
TOTAL PROTEIN: 6.6 g/dL (ref 6.5–8.1)

## 2017-06-17 LAB — CBC WITH DIFFERENTIAL/PLATELET
Basophils Absolute: 0.1 10*3/uL (ref 0–0.1)
Basophils Relative: 1 %
Eosinophils Absolute: 0 10*3/uL (ref 0–0.7)
Eosinophils Relative: 1 %
HEMATOCRIT: 34.7 % — AB (ref 40.0–52.0)
HEMOGLOBIN: 11.7 g/dL — AB (ref 13.0–18.0)
LYMPHS ABS: 1.2 10*3/uL (ref 1.0–3.6)
LYMPHS PCT: 18 %
MCH: 29.5 pg (ref 26.0–34.0)
MCHC: 33.7 g/dL (ref 32.0–36.0)
MCV: 87.7 fL (ref 80.0–100.0)
MONOS PCT: 6 %
Monocytes Absolute: 0.4 10*3/uL (ref 0.2–1.0)
NEUTROS ABS: 4.8 10*3/uL (ref 1.4–6.5)
NEUTROS PCT: 74 %
Platelets: 227 10*3/uL (ref 150–440)
RBC: 3.96 MIL/uL — ABNORMAL LOW (ref 4.40–5.90)
RDW: 14.5 % (ref 11.5–14.5)
WBC: 6.5 10*3/uL (ref 3.8–10.6)

## 2017-06-17 NOTE — Progress Notes (Signed)
No new changes noted today 

## 2017-06-19 NOTE — Progress Notes (Signed)
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 He also has a past history of prostate cancer about 15 years ago(Gleasons score 8)and was treated with prostatectomy at that time. He did not receive any radiation treatment and does not remember if his PSA was subsequently monitored. PSA in March 2015 was 7.9 and in 2014 was 6.7 after prostatectomy. He was last seen by medical oncology Dr. Everlene Other in 2015 and at that time his PSA was 9.2 previously was supposed to follow-up after 2 months and ADT was supposed to be started at that time the patient was lost to follow-up.  He was seen by Dr. Golden Pop and his primary care doctor he saw until about 2 years ago. He has had ongoing chronic back pain and has had back surgery in the past but was having worsening back pain over the last few months and was seen by Dr. Raul Del.   MRI of the lumbar spine on 11/22/2016 revealed extensive osseous metastatic disease with pathologic compression fracture of the L3 vertebral body. Posteriorly displace fragments and metastatic soft tissue result in severe canal stenosis and compression of the thecal sac at this level.  CT chest abdomen and pelvis on 11/25/2016 did not reveal any evidence of metastatic disease other than his bones  Patient had decompression surgery of his  L3vertebral body which showed:Metastatic adenocarcinoma with features consistent with primary prostatic origin. See note.  Note: The patient history of previously diagnosed prostate carcinoma is noted. Immunoperoxidase stains were performed on formalin-fixed (non-decalcified) paraffin-embedded tissue in block A3 with appropriate positive controls. The tumor cells stain with CK8/18, PROSTATIC SPECIFIC ANTIGEN, PROSTATIC ACID PHOSPHATASE and P501S. The tumor cells do not stain with CYTOKERATIN 7, CYTOKERATIN 20, CK5/6, P40, GATA-3, CDX-2 or THYROID TRANSCRIPTION FACTOR (TTF-1). These findings are in support of the interpretation of metastatic adenocarcinoma with features consistent with primary prostatic origin.  Patient also has primary hyperparathyroidism and hypercalcemia secondary to it for which he sees Dr.Akhter from [facility] and has been referred to surgery for the same  Abiraterone was started for high risk castrate sensitive prostate cancer on 02/14/2017.Baseline bone density showed osteopenia with a T score of -1.9 at the right femur neck.   Interval history- tolerating zytiga well. He does have leg swelling. He was taken off thiazide as a possible cause of hypercalcemia. Energy levels and appetite is good. He has gained 10 pounds in 2  months  ECOG PS- 1 Pain scale- 0   Review of systems- Review of Systems  Constitutional: Negative for chills, fever, malaise/fatigue and weight loss.  HENT: Negative for congestion, ear discharge and nosebleeds.   Eyes: Negative for blurred vision.  Respiratory: Negative for cough, hemoptysis, sputum production, shortness of breath and wheezing.   Cardiovascular: Positive for leg swelling. Negative for chest pain, palpitations, orthopnea and claudication.  Gastrointestinal: Negative for abdominal pain, blood in stool, constipation, diarrhea, heartburn, melena, nausea and vomiting.  Genitourinary: Negative  for dysuria, flank pain, frequency, hematuria  and urgency.  Musculoskeletal: Negative for back pain, joint pain and myalgias.  Skin: Negative for rash.  Neurological: Negative for dizziness, tingling, focal weakness, seizures, weakness and headaches.  Endo/Heme/Allergies: Does not bruise/bleed easily.  Psychiatric/Behavioral: Negative for depression and suicidal ideas. The patient does not have insomnia.      No Known Allergies   Past Medical History:  Diagnosis Date  . Arthritis   . Cancer [facility])    Prostate CA approximately 11 years ago  . Hyperlipidemia   . Hypertension   . Myocardial infarction [facility])      Past Surgical History:  Procedure Laterality Date  . APPENDECTOMY    . BACK SURGERY    . CARDIAC CATHETERIZATION     stent x 1  . CHOLECYSTECTOMY    . LUMBAR LAMINECTOMY/DECOMPRESSION MICRODISCECTOMY Left 08/20/2012   Procedure: LUMBAR LAMINECTOMY/DECOMPRESSION MICRODISCECTOMY 1 LEVEL;  Surgeon: Faythe Ghee, MD;  Location: [facility] NEURO ORS;  Service: Neurosurgery;  Laterality: Left;  lumbar four-five  . PROSTATE SURGERY    . TONSILLECTOMY      Social History   Socioeconomic History  . Marital status: Married    Spouse name: Not on file  . Number of children: Not on file  . Years of education: Not on file  . Highest education level: Not on file  Occupational History  . Not on file  Social Needs  . Financial resource strain: Not on file  . Food insecurity:    Worry: Not on file    Inability: Not on file  . Transportation needs:    Medical: Not on file    Non-medical: Not on file  Tobacco Use  . Smoking status: Never Smoker  . Smokeless tobacco: Never Used  Substance and Sexual Activity  . Alcohol use: No  . Drug use: No  . Sexual activity: Not on file  Lifestyle  . Physical activity:    Days per week: Not on file    Minutes per session: Not on file  . Stress: Not on file  Relationships  . Social connections:    Talks on phone: Not on file    Gets together: Not on file    Attends religious  service: Not on file    Active member of club or organization: Not on file    Attends meetings of clubs or organizations: Not on file    Relationship status: Not on file  . Intimate partner violence:    Fear of current or ex partner: Not on file    Emotionally abused: Not on file    Physically abused: Not on file    Forced sexual activity: Not on file  Other Topics Concern  . Not on file  Social History Narrative  . Not on file    Family History  Problem Relation Age of Onset  . Stroke Mother   . Heart attack Father      Current Outpatient Medications:  .  aspirin EC 81 MG tablet, Take 81 mg by mouth daily., Disp: , Rfl:  .  atorvastatin (LIPITOR) 10 MG tablet, Take 10 mg by mouth daily., Disp: , Rfl:  .  cholecalciferol (VITAMIN D) 1000 units tablet, Take 2,000 Units by mouth daily., Disp: , Rfl:  .  clopidogrel (PLAVIX) 75 MG tablet, Take 75 mg by mouth daily., Disp: , Rfl:  .  diltiazem (CARDIZEM SR) 120 MG 12 hr capsule, Take 120 mg by mouth daily. , Disp: , Rfl:  .  hydrALAZINE (APRESOLINE) 25 MG tablet, Take 25 mg by mouth 3 (three) times daily., Disp: , Rfl:  .  metoprolol tartrate (LOPRESSOR) 25 MG tablet, Take 50 mg by mouth 2 (two) times daily. , Disp: , Rfl:  .  predniSONE (DELTASONE) 5 MG tablet, TAKE 1 TABLET BY MOUTH ONCE DAILY WITH BREAKFAST, Disp: 30 tablet, Rfl: 3 .  ZYTIGA 250 MG tablet, TAKE 4 TABLETS BY MOUTH ONCE DAILY AS DIRECTED.  TAKE 1 HOUR BEFORE OR 2 HOURS AFTER A MEAL, Disp: 120 tablet, Rfl: 11 .  nitroGLYCERIN (NITROSTAT) 0.4 MG SL tablet, As directed, Disp: , Rfl:   Physical exam:  Vitals:   06/17/17 1354  BP: 140/72  Pulse: (!) 48  Resp: 18  Temp: (!) 97 F (36.1 C)  TempSrc: Tympanic  SpO2: 97%  Weight: 202 lb 6.4 oz (91.8 kg)  Height: 5\' 10"  (1.778 m)   Physical Exam  Constitutional: He is oriented to person, place, and time and well-developed, well-nourished, and in no distress.  HENT:  Head: Normocephalic and atraumatic.  Eyes:  Pupils are equal, round, and reactive to light. EOM are normal.  Neck: Normal range of motion.  Cardiovascular: Normal rate, regular rhythm and normal heart sounds.  Pulmonary/Chest: Effort normal and breath sounds normal.  Abdominal: Soft. Bowel sounds are normal.  Musculoskeletal: He exhibits edema (b/l +2).  Neurological: He is alert and oriented to person, place, and time.  Skin: Skin is warm and dry.     CMP Latest Ref Rng & Units 06/17/2017  Glucose 65 - 99 mg/dL 124(H)  BUN 6 - 20 mg/dL 18  Creatinine 0.61 - 1.24 mg/dL 0.91  Sodium 135 - 145 mmol/L 141  Potassium 3.5 - 5.1 mmol/L 4.4  Chloride 101 - 111 mmol/L 106  CO2 22 - 32 mmol/L 27  Calcium 8.9 - 10.3 mg/dL 10.8(H)  Total Protein 6.5 - 8.1 g/dL 6.6  Total Bilirubin 0.3 - 1.2 mg/dL 0.5  Alkaline Phos 38 - 126 U/L 71  AST 15 - 41 U/L 16  ALT 17 - 63 U/L 11(L)   CBC Latest Ref Rng & Units 06/17/2017  WBC 3.8 - 10.6 K/uL 6.5  Hemoglobin 13.0 - 18.0 g/dL 11.7(L)  Hematocrit 40.0 - 52.0 % 34.7(L)  Platelets 150 - 440 K/uL 227    No images are attached to the encounter.  Nm Bone Scan Whole Body  Result Date: 06/12/2017 CLINICAL DATA:  Restaging for prostate carcinoma with metastatic bone disease. EXAM: NUCLEAR MEDICINE WHOLE BODY BONE SCAN TECHNIQUE: Whole body anterior and posterior images were obtained approximately 3 hours after intravenous injection of radiopharmaceutical. RADIOPHARMACEUTICALS:  20.93 mCi Technetium-56m MDP IV COMPARISON:  Prior bone scans, most recent 01/09/2017. FINDINGS: There are multiple foci of abnormal radiotracer localization consistent with metastatic disease to bone. There is a focus of uptake involving the proximal right humerus, 2 small foci bulging the right sternum, several scattered foci along the thoracolumbar spine, and foci involving the left posterior fourth rib and twelfth ribs, right posterior ninth and sixth ribs. There are foci of uptake within the sacrum, left medial acetabulum and  along the anterior left ilium and right ischium. When compared to the most recent prior exam, these foci are less intense, but demonstrate a stable distribution. No convincing new metastatic lesions to bone. Degenerative uptake is stable most evident involving both knees. IMPRESSION: 1. Multiple foci of metastatic disease to bone. These appear less intense than they did on the most recent prior bone scan, but the  distribution of lesions is stable and there is no evidence of a new bone lesion. Electronically Signed   By: Lajean Manes M.D.   On: 06/12/2017 15:32     Assessment and plan- Patient is a 83 y.o. male with high risk castrate sensitive metastatic prostate cancer with bone metastases on lupron and zytiga  Overall he is tolerating zytiga well. He does have leg swelling which could be due to it. However, he was taken off thiazides and currently on lasix. I have advised leg elevation and compression stockings as well. If leg swelling persists, worsens- I will consider dose adjustment. PSA continues to trend down to 0.36. I have personally reviewed bone scan images independently. Overall he has stable disease and bone disease appears less intense. He will continue zytiga and prednisone at this time  He is due to get lupron shot next month and I will transition him to Q6 month 45 mg shots.  Hypercalcemia- unrelated to prostate cancer. Being followed by endocrinology at [facility]. Ca stable at 10.8  RTC in 1 month-. See md. Cbc, cmp and psa    Visit Diagnosis 1. Prostate cancer metastatic to bone ([place])   2. Bilateral leg edema   3. High risk medication use      Dr. Randa Evens, MD, MPH [facility] at [facility] Pager- [phone] 06/19/2017 8:39 AM

## 2017-07-18 ENCOUNTER — Inpatient Hospital Stay: Payer: Medicare Other | Attending: Oncology

## 2017-07-18 ENCOUNTER — Inpatient Hospital Stay (HOSPITAL_BASED_OUTPATIENT_CLINIC_OR_DEPARTMENT_OTHER): Payer: Medicare Other | Admitting: Oncology

## 2017-07-18 ENCOUNTER — Ambulatory Visit: Payer: Medicare Other

## 2017-07-18 ENCOUNTER — Inpatient Hospital Stay: Payer: Medicare Other | Admitting: Oncology

## 2017-07-18 ENCOUNTER — Inpatient Hospital Stay: Payer: Medicare Other

## 2017-07-18 ENCOUNTER — Encounter: Payer: Self-pay | Admitting: Oncology

## 2017-07-18 ENCOUNTER — Telehealth: Payer: Self-pay | Admitting: *Deleted

## 2017-07-18 VITALS — BP 126/71 | HR 38 | Temp 97.9°F | Resp 18 | Ht 70.0 in | Wt 201.1 lb

## 2017-07-18 DIAGNOSIS — R001 Bradycardia, unspecified: Secondary | ICD-10-CM

## 2017-07-18 DIAGNOSIS — C61 Malignant neoplasm of prostate: Secondary | ICD-10-CM

## 2017-07-18 DIAGNOSIS — C7951 Secondary malignant neoplasm of bone: Secondary | ICD-10-CM | POA: Diagnosis present

## 2017-07-18 DIAGNOSIS — Z5111 Encounter for antineoplastic chemotherapy: Secondary | ICD-10-CM | POA: Diagnosis present

## 2017-07-18 DIAGNOSIS — R609 Edema, unspecified: Secondary | ICD-10-CM

## 2017-07-18 DIAGNOSIS — Z79899 Other long term (current) drug therapy: Secondary | ICD-10-CM

## 2017-07-18 LAB — COMPREHENSIVE METABOLIC PANEL
ALBUMIN: 3.6 g/dL (ref 3.5–5.0)
ALK PHOS: 64 U/L (ref 38–126)
ALT: 11 U/L — ABNORMAL LOW (ref 17–63)
ANION GAP: 7 (ref 5–15)
AST: 16 U/L (ref 15–41)
BUN: 18 mg/dL (ref 6–20)
CO2: 24 mmol/L (ref 22–32)
Calcium: 10.6 mg/dL — ABNORMAL HIGH (ref 8.9–10.3)
Chloride: 107 mmol/L (ref 101–111)
Creatinine, Ser: 1.04 mg/dL (ref 0.61–1.24)
GFR calc Af Amer: >60 mL/min (ref >60)
GFR calc non Af Amer: >60 mL/min (ref >60)
GLUCOSE: 120 mg/dL — AB (ref 65–99)
POTASSIUM: 3.9 mmol/L (ref 3.5–5.1)
SODIUM: 138 mmol/L (ref 135–145)
Total Bilirubin: 0.7 mg/dL (ref 0.3–1.2)
Total Protein: 6.3 g/dL — ABNORMAL LOW (ref 6.5–8.1)

## 2017-07-18 LAB — CBC WITH DIFFERENTIAL/PLATELET
Basophils Absolute: 0 10*3/uL (ref 0–0.1)
Basophils Relative: 0 %
Eosinophils Absolute: 0.1 10*3/uL (ref 0–0.7)
Eosinophils Relative: 2 %
HEMATOCRIT: 33.7 % — AB (ref 40.0–52.0)
Hemoglobin: 11.2 g/dL — ABNORMAL LOW (ref 13.0–18.0)
Lymphocytes Relative: 20 %
Lymphs Abs: 1.2 10*3/uL (ref 1.0–3.6)
MCH: 28.9 pg (ref 26.0–34.0)
MCHC: 33.3 g/dL (ref 32.0–36.0)
MCV: 86.8 fL (ref 80.0–100.0)
MONO ABS: 0.4 10*3/uL (ref 0.2–1.0)
MONOS PCT: 6 %
NEUTROS ABS: 4.5 10*3/uL (ref 1.4–6.5)
Neutrophils Relative %: 72 %
Platelets: 246 10*3/uL (ref 150–440)
RBC: 3.88 MIL/uL — ABNORMAL LOW (ref 4.40–5.90)
RDW: 14.1 % (ref 11.5–14.5)
WBC: 6.3 10*3/uL (ref 3.8–10.6)

## 2017-07-18 LAB — PSA: Prostatic Specific Antigen: 0.28 ng/mL (ref 0.00–4.00)

## 2017-07-18 MED ORDER — LEUPROLIDE ACETATE (6 MONTH) 45 MG IM KIT
45.0000 mg | PACK | Freq: Once | INTRAMUSCULAR | Status: AC
  Administered 2017-07-18: 45 mg via INTRAMUSCULAR
  Filled 2017-07-18: qty 45

## 2017-07-21 ENCOUNTER — Telehealth: Payer: Self-pay | Admitting: *Deleted

## 2017-07-21 NOTE — Progress Notes (Signed)
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 He also has a past history of prostate cancer about 15 years ago(Gleasons score 8)and was treated with prostatectomy at that time. He did not receive any radiation treatment and does not remember if his PSA was subsequently monitored. PSA in March 2015 was 7.9 and in 2014 was 6.7 after prostatectomy. He was last seen by medical oncology Dr. Everlene Other in 2015 and at that time his PSA was 9.2 previously was supposed to follow-up after 2 months and ADT was supposed to be started at that time the patient was lost to follow-up.  He was seen by Dr. Golden Pop and his primary care doctor he saw until about 2 years ago. He has had ongoing chronic back pain and has had back surgery in the past but was having worsening back pain over the last few months and was seen by Dr. Raul Del.   MRI of the lumbar spine on 11/22/2016 revealed extensive osseous metastatic disease with pathologic compression fracture of the L3 vertebral body. Posteriorly displace fragments and metastatic soft tissue result in severe canal stenosis and compression of the thecal sac at this level.  CT chest abdomen and pelvis on 11/25/2016 did not reveal any evidence of metastatic disease other than his bones  Patient had decompression surgery of his  L3vertebral body which showed:Metastatic adenocarcinoma with features consistent with primary prostatic origin. See note.  Note: The patient history of previously diagnosed prostate carcinoma is noted. Immunoperoxidase stains were performed on formalin-fixed (non-decalcified) paraffin-embedded tissue in block A3 with appropriate positive controls. The tumor cells stain with CK8/18, PROSTATIC SPECIFIC ANTIGEN, PROSTATIC ACID PHOSPHATASE and P501S. The tumor cells do not stain with CYTOKERATIN 7, CYTOKERATIN 20, CK5/6, P40, GATA-3, CDX-2 or THYROID TRANSCRIPTION FACTOR (TTF-1). These findings are in support of the interpretation of metastatic adenocarcinoma with features consistent with primary prostatic origin.  Patient also has primary hyperparathyroidism and hypercalcemia secondary to it for which he sees Dr.Akhter from [facility] and has been referred to surgery for the same  Abiraterone was started for high risk castrate sensitive prostate cancer on 02/14/2017.Baseline bone density showed osteopenia with a T score of -1.9 at the right femur neck.    Interval history-he continues to have issues with leg swelling and is working with his cardiologist to adjust his medications.  Denies any side effects from [place].  Energy levels and appetite is good  ECOG PS- 1 Pain scale- 0   Review of systems- Review of Systems  Constitutional: Negative for chills, fever, malaise/fatigue and weight loss.  HENT: Negative for congestion, ear discharge and nosebleeds.   Eyes: Negative for blurred vision.  Respiratory: Negative for cough, hemoptysis, sputum production, shortness of breath and wheezing.   Cardiovascular: Positive for leg swelling. Negative for chest pain, palpitations, orthopnea and claudication.  Gastrointestinal: Negative for abdominal pain, blood in stool, constipation, diarrhea, heartburn, melena, nausea and vomiting.  Genitourinary: Negative for dysuria, flank pain, frequency,  hematuria and  urgency.  Musculoskeletal: Negative for back pain, joint pain and myalgias.  Skin: Negative for rash.  Neurological: Negative for dizziness, tingling, focal weakness, seizures, weakness and headaches.  Endo/Heme/Allergies: Does not bruise/bleed easily.  Psychiatric/Behavioral: Negative for depression and suicidal ideas. The patient does not have insomnia.      No Known Allergies   Past Medical History:  Diagnosis Date  . Arthritis   . Cancer [facility])    Prostate CA approximately 11 years ago  . Hyperlipidemia   . Hypertension   . Myocardial infarction [facility])      Past Surgical History:  Procedure Laterality Date  . APPENDECTOMY    . BACK SURGERY    . CARDIAC CATHETERIZATION     stent x 1  . CHOLECYSTECTOMY    . LUMBAR LAMINECTOMY/DECOMPRESSION MICRODISCECTOMY Left 08/20/2012   Procedure: LUMBAR LAMINECTOMY/DECOMPRESSION MICRODISCECTOMY 1 LEVEL;  Surgeon: Faythe Ghee, MD;  Location: [facility];  Service: Neurosurgery;  Laterality: Left;  lumbar four-five  . PROSTATE SURGERY    . TONSILLECTOMY      Social History   Socioeconomic History  . Marital status: Married    Spouse name: Not on file  . Number of children: Not on file  . Years of education: Not on file  . Highest education level: Not on file  Occupational History  . Not on file  Social Needs  . Financial resource strain: Not on file  . Food insecurity:    Worry: Not on file    Inability: Not on file  . Transportation needs:    Medical: Not on file    Non-medical: Not on file  Tobacco Use  . Smoking status: Never Smoker  . Smokeless tobacco: Never Used  Substance and Sexual Activity  . Alcohol use: No  . Drug use: No  . Sexual activity: Not on file  Lifestyle  . Physical activity:    Days per week: Not on file    Minutes per session: Not on file  . Stress: Not on file  Relationships  . Social connections:    Talks on phone: Not on file    Gets together: Not on file    Attends religious  service: Not on file    Active member of club or organization: Not on file    Attends meetings of clubs or organizations: Not on file    Relationship status: Not on file  . Intimate partner violence:    Fear of current or ex partner: Not on file    Emotionally abused: Not on file    Physically abused: Not on file    Forced sexual activity: Not on file  Other Topics Concern  . Not on file  Social History Narrative  . Not on file    Family History  Problem Relation Age of Onset  . Stroke Mother   . Heart attack Father      Current Outpatient Medications:  .  aspirin EC 81 MG tablet, Take 81 mg by mouth daily., Disp: , Rfl:  .  atorvastatin (LIPITOR) 10 MG tablet, Take 10 mg by mouth daily., Disp: , Rfl:  .  cholecalciferol (VITAMIN D) 1000 units tablet, Take 2,000 Units by mouth daily., Disp: , Rfl:  .  clopidogrel (PLAVIX) 75 MG tablet, Take 75 mg by mouth daily., Disp: , Rfl:  .  diltiazem (CARDIZEM SR) 120 MG 12 hr capsule, Take 120 mg by mouth daily. , Disp: , Rfl:  .  hydrALAZINE (APRESOLINE) 25  MG tablet, Take 25 mg by mouth 3 (three) times daily., Disp: , Rfl:  .  metoprolol tartrate (LOPRESSOR) 25 MG tablet, Take 50 mg by mouth 2 (two) times daily. , Disp: , Rfl:  .  predniSONE (DELTASONE) 5 MG tablet, TAKE 1 TABLET BY MOUTH ONCE DAILY WITH BREAKFAST, Disp: 30 tablet, Rfl: 3 .  ZYTIGA 250 MG tablet, TAKE 4 TABLETS BY MOUTH ONCE DAILY AS DIRECTED.  TAKE 1 HOUR BEFORE OR 2 HOURS AFTER A MEAL, Disp: 120 tablet, Rfl: 11 .  nitroGLYCERIN (NITROSTAT) 0.4 MG SL tablet, As directed, Disp: , Rfl:   Physical exam:  Vitals:   07/18/17 1204  BP: 126/71  Pulse: (!) 38  Resp: 18  Temp: 97.9 F (36.6 C)  TempSrc: Tympanic  SpO2: 97%  Weight: 201 lb 1.6 oz (91.2 kg)  Height: 5\' 10"  (1.778 m)   Physical Exam  Constitutional: He is oriented to person, place, and time. He appears well-developed and well-nourished.  HENT:  Head: Normocephalic and atraumatic.  Eyes: Pupils are  equal, round, and reactive to light. EOM are normal.  Neck: Normal range of motion.  Cardiovascular: Regular rhythm and normal heart sounds.  bradycardic  Pulmonary/Chest: Effort normal and breath sounds normal.  Abdominal: Soft. Bowel sounds are normal.  Musculoskeletal: He exhibits edema (Bilateral +2 pitting).  Neurological: He is alert and oriented to person, place, and time.  Skin: Skin is warm and dry.     CMP Latest Ref Rng & Units 07/18/2017  Glucose 65 - 99 mg/dL 120(H)  BUN 6 - 20 mg/dL 18  Creatinine 0.61 - 1.24 mg/dL 1.04  Sodium 135 - 145 mmol/L 138  Potassium 3.5 - 5.1 mmol/L 3.9  Chloride 101 - 111 mmol/L 107  CO2 22 - 32 mmol/L 24  Calcium 8.9 - 10.3 mg/dL 10.6(H)  Total Protein 6.5 - 8.1 g/dL 6.3(L)  Total Bilirubin 0.3 - 1.2 mg/dL 0.7  Alkaline Phos 38 - 126 U/L 64  AST 15 - 41 U/L 16  ALT 17 - 63 U/L 11(L)   CBC Latest Ref Rng & Units 07/18/2017  WBC 3.8 - 10.6 K/uL 6.3  Hemoglobin 13.0 - 18.0 g/dL 11.2(L)  Hematocrit 40.0 - 52.0 % 33.7(L)  Platelets 150 - 440 K/uL 246      Assessment and plan- Patient is a 83 y.o. male with high risk castrate sensitive metastatic prostate cancer with bone metastases on lupron and zytiga  1. Continue zytiga. He will receive 45 mg lupron today and will get it Q6 months. He does have osteopenia and last received zometa in November. Will give dose in 2 months time. Overall his PSA is trending down and he is responding to zytiga so far  2. Bradycardia- patient onmetoprolol 50 mg BID. His HR has been between 38-48 during our office vsiits. Denies any chest pain, light headedness. Will notify his cardiologist  3. Leg swelling- although this could be due to zytiga, this has been a chronic issue for the patient and he was taken off HCTZ due to hypercalcemia. His cardiologist is adjusting his lasix  4. Hypercalcemia- medication induced versus primary hyperparathyroidism. He follows up with Dr. Paticia Stack at [facility]    I will see him  back in 2 months with cbc, cmp and psa. Will consider zometa at that time   Visit Diagnosis 1. Prostate cancer metastatic to bone [facility])      Dr. Randa Evens, MD, MPH [facility] at [facility] [phone] 07/21/2017 9:42 AM

## 2017-07-21 NOTE — Telephone Encounter (Signed)
I called the md office today and said that dr. Janese Banks never got a call back.  Carlos Perkins states that their office called pt directly and managing him and his heart rate.

## 2017-07-21 NOTE — Telephone Encounter (Signed)
I had rechecked his pulse manually and it was 40.  The pt states that his pulse is 38 to 42 most days and his cardiologist is aware.  I called the offie of dr. Smith Robert and they were paging her to speak to Dr. Janese Banks about this pt. According to the office the rate they would like to see pt is in los 50's

## 2017-09-03 ENCOUNTER — Other Ambulatory Visit: Payer: Self-pay | Admitting: *Deleted

## 2017-09-03 MED ORDER — PREDNISONE 5 MG PO TABS: ORAL_TABLET | ORAL | 3 refills | Status: DC

## 2017-09-16 ENCOUNTER — Inpatient Hospital Stay: Payer: Medicare Other | Attending: Oncology | Admitting: Oncology

## 2017-09-16 ENCOUNTER — Inpatient Hospital Stay: Payer: Medicare Other

## 2017-09-16 ENCOUNTER — Encounter: Payer: Self-pay | Admitting: Oncology

## 2017-09-16 VITALS — BP 121/69 | HR 68 | Temp 98.1°F | Resp 18 | Ht 70.0 in | Wt 200.0 lb

## 2017-09-16 DIAGNOSIS — C7951 Secondary malignant neoplasm of bone: Secondary | ICD-10-CM | POA: Diagnosis not present

## 2017-09-16 DIAGNOSIS — R609 Edema, unspecified: Secondary | ICD-10-CM | POA: Insufficient documentation

## 2017-09-16 DIAGNOSIS — M85851 Other specified disorders of bone density and structure, right thigh: Secondary | ICD-10-CM

## 2017-09-16 DIAGNOSIS — Z79899 Other long term (current) drug therapy: Secondary | ICD-10-CM | POA: Insufficient documentation

## 2017-09-16 DIAGNOSIS — C61 Malignant neoplasm of prostate: Secondary | ICD-10-CM

## 2017-09-16 DIAGNOSIS — M8580 Other specified disorders of bone density and structure, unspecified site: Secondary | ICD-10-CM

## 2017-09-16 LAB — CBC WITH DIFFERENTIAL/PLATELET
Basophils Absolute: 0 10*3/uL (ref 0–0.1)
Basophils Relative: 0 %
EOS PCT: 1 %
Eosinophils Absolute: 0.1 10*3/uL (ref 0–0.7)
HEMATOCRIT: 35.3 % — AB (ref 40.0–52.0)
Hemoglobin: 11.8 g/dL — ABNORMAL LOW (ref 13.0–18.0)
LYMPHS PCT: 22 %
Lymphs Abs: 1.3 10*3/uL (ref 1.0–3.6)
MCH: 28.5 pg (ref 26.0–34.0)
MCHC: 33.5 g/dL (ref 32.0–36.0)
MCV: 84.9 fL (ref 80.0–100.0)
MONO ABS: 0.3 10*3/uL (ref 0.2–1.0)
MONOS PCT: 5 %
NEUTROS ABS: 4.4 10*3/uL (ref 1.4–6.5)
Neutrophils Relative %: 72 %
PLATELETS: 269 10*3/uL (ref 150–440)
RBC: 4.15 MIL/uL — ABNORMAL LOW (ref 4.40–5.90)
RDW: 15 % — AB (ref 11.5–14.5)
WBC: 6.1 10*3/uL (ref 3.8–10.6)

## 2017-09-16 LAB — COMPREHENSIVE METABOLIC PANEL
ALBUMIN: 4 g/dL (ref 3.5–5.0)
ALK PHOS: 75 U/L (ref 38–126)
ALT: 13 U/L — ABNORMAL LOW (ref 17–63)
AST: 18 U/L (ref 15–41)
Anion gap: 7 (ref 5–15)
BUN: 19 mg/dL (ref 6–20)
CO2: 27 mmol/L (ref 22–32)
Calcium: 11.1 mg/dL — ABNORMAL HIGH (ref 8.9–10.3)
Chloride: 106 mmol/L (ref 101–111)
Creatinine, Ser: 1.08 mg/dL (ref 0.61–1.24)
GFR calc Af Amer: >60 mL/min (ref >60)
GFR calc non Af Amer: >60 mL/min (ref >60)
GLUCOSE: 138 mg/dL — AB (ref 65–99)
POTASSIUM: 3.6 mmol/L (ref 3.5–5.1)
SODIUM: 140 mmol/L (ref 135–145)
Total Bilirubin: 0.9 mg/dL (ref 0.3–1.2)
Total Protein: 6.9 g/dL (ref 6.5–8.1)

## 2017-09-16 NOTE — Progress Notes (Signed)
No concerns today 

## 2017-09-17 ENCOUNTER — Other Ambulatory Visit: Payer: Self-pay | Admitting: Oncology

## 2017-09-17 ENCOUNTER — Inpatient Hospital Stay: Payer: Medicare Other

## 2017-09-17 DIAGNOSIS — C7951 Secondary malignant neoplasm of bone: Principal | ICD-10-CM

## 2017-09-17 DIAGNOSIS — C61 Malignant neoplasm of prostate: Secondary | ICD-10-CM

## 2017-09-17 LAB — PSA: Prostatic Specific Antigen: 0.15 ng/mL (ref 0.00–4.00)

## 2017-09-17 MED ORDER — ZOLEDRONIC ACID 4 MG/100ML IV SOLN
4.0000 mg | Freq: Once | INTRAVENOUS | Status: AC
  Administered 2017-09-17: 4 mg via INTRAVENOUS
  Filled 2017-09-17: qty 100

## 2017-09-17 MED ORDER — SODIUM CHLORIDE 0.9 % IV SOLN
INTRAVENOUS | Status: DC
  Administered 2017-09-17: 15:00:00 via INTRAVENOUS
  Filled 2017-09-17: qty 1000

## 2017-09-17 NOTE — Progress Notes (Signed)
Called by RN, patient scheduled to get Zometa today, no order. Dr.Rao off today Chart reviewed. Normal Kidney function, Hypercalcemia.  Zometa 4mg  x 1 ordered.

## 2017-09-18 ENCOUNTER — Telehealth: Payer: Self-pay | Admitting: *Deleted

## 2017-09-18 NOTE — Telephone Encounter (Signed)
Daughter called to report that patient was extremely fatigued today. He has eaten and had something to drink.  She was concerned that it was not normal. Writer consulted with MD and RN for this patient and was advised that all his treatments could make him fatigued. Advised daughter to continue to monitor and call clinic if his condition worsened.

## 2017-09-18 NOTE — Progress Notes (Signed)
Hematology/Oncology Consult note 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 He also has a past history of prostate cancer about 15 years ago(Gleasons score 8)and was treated with prostatectomy at that time. He did not receive any radiation treatment and does not remember if his PSA was subsequently monitored. PSA in March 2015 was 7.9 and in 2014 was 6.7 after prostatectomy. He was last seen by medical oncology Dr. Everlene Other in 2015 and at that time his PSA was 9.2 previously was supposed to follow-up after 2 months and ADT was supposed to be started at that time the patient was lost to follow-up.  He was seen by Dr. Golden Pop and his primary care doctor he saw until about 2 years ago. He has had ongoing chronic back pain and has had back surgery in the past but was having worsening back pain over the last few months and was seen by Dr. Raul Del.   MRI of the lumbar spine on 11/22/2016 revealed extensive osseous metastatic disease with pathologic compression fracture of the L3 vertebral body. Posteriorly displace fragments and metastatic soft tissue result in severe canal stenosis and compression of the thecal sac at this level.  CT chest abdomen and pelvis on 11/25/2016 did not reveal any evidence of metastatic disease other than his bones  Patient had decompression surgery of his L3vertebral body  which showed:Metastatic adenocarcinoma with features consistent with primary prostatic origin. See note.  Note: The patient history of previously diagnosed prostate carcinoma is noted. Immunoperoxidase stains were performed on formalin-fixed (non-decalcified) paraffin-embedded tissue in block A3 with appropriate positive controls. The tumor cells stain with CK8/18, PROSTATIC SPECIFIC ANTIGEN, PROSTATIC ACID PHOSPHATASE and P501S. The tumor cells do not stain with CYTOKERATIN 7, CYTOKERATIN 20, CK5/6, P40, GATA-3, CDX-2 or THYROID TRANSCRIPTION FACTOR (TTF-1). These findings are in support of the interpretation of metastatic adenocarcinoma with features consistent with primary prostatic origin.  Patient also has primary hyperparathyroidism and hypercalcemia secondary to it for which he sees Dr.Akhter from [facility] and has been referred to surgery for the same  Abiraterone was started for high risk castrate sensitive prostate cancer on 02/14/2017.Baseline bone density showed osteopenia with a T score of -1.9 at the right femur neck.  Interval history- he continues to have issues with b/l LE edema which is being managed by cardiology. Other than that he is tolerating zytiga well without any issues. Reports feeling well and denies any new complaints  ECOG PS- 1 Pain scale- 0 Opioid associated constipation- 0  Review of systems- Review of Systems  Constitutional: Positive for malaise/fatigue. Negative for chills, fever and weight loss.  HENT: Negative for congestion, ear discharge and nosebleeds.   Eyes: Negative for blurred vision.  Respiratory: Negative for cough, hemoptysis, sputum production, shortness of breath and wheezing.   Cardiovascular: Positive for leg swelling. Negative for chest pain, palpitations, orthopnea and claudication.  Gastrointestinal: Negative for abdominal pain, blood in stool, constipation, diarrhea, heartburn, melena, nausea and vomiting.  Genitourinary: Negative for  dysuria, flank pain, frequency, hematuria and urgency.  Musculoskeletal: Negative for back pain, joint pain and myalgias.  Skin: Negative for rash.  Neurological: Negative for dizziness, tingling, focal weakness, seizures, weakness and headaches.  Endo/Heme/Allergies: Does not bruise/bleed easily.  Psychiatric/Behavioral: Negative for depression and suicidal ideas. The patient does not have insomnia.        No Known Allergies   Past Medical History:  Diagnosis Date  . Arthritis   . Cancer [facility])    Prostate CA approximately 11 years ago  . Hyperlipidemia   . Hypertension   . Myocardial infarction [facility])      Past Surgical History:  Procedure Laterality Date  . APPENDECTOMY    . BACK SURGERY    . CARDIAC CATHETERIZATION     stent x 1  . CHOLECYSTECTOMY    . LUMBAR LAMINECTOMY/DECOMPRESSION MICRODISCECTOMY Left 08/20/2012   Procedure: LUMBAR LAMINECTOMY/DECOMPRESSION MICRODISCECTOMY 1 LEVEL;  Surgeon: Faythe Ghee, MD;  Location: [facility];  Service: Neurosurgery;  Laterality: Left;  lumbar four-five  . PROSTATE SURGERY    . TONSILLECTOMY      Social History   Socioeconomic History  . Marital status: Married    Spouse name: Not on file  . Number of children: Not on file  . Years of education: Not on file  . Highest education level: Not on file  Occupational History  . Not on file  Social Needs  . Financial resource strain: Not on file  . Food insecurity:    Worry: Not on file    Inability: Not on file  . Transportation needs:    Medical: Not on file    Non-medical: Not on file  Tobacco Use  . Smoking status: Never Smoker  . Smokeless tobacco: Never Used  Substance and Sexual Activity  . Alcohol use: No  . Drug use: No  . Sexual activity: Not on file  Lifestyle  . Physical activity:    Days per week: Not on file    Minutes per session: Not on file  . Stress: Not on file  Relationships  . Social connections:    Talks on phone: Not on file    Gets  together: Not on file    Attends religious service: Not on file    Active member of club or organization: Not on file    Attends meetings of clubs or organizations: Not on file    Relationship status: Not on file  . Intimate partner violence:    Fear of current or ex partner: Not on file    Emotionally abused: Not on file    Physically abused: Not on file    Forced sexual activity: Not on file  Other Topics Concern  . Not on file  Social History Narrative  . Not on file    Family History  Problem Relation Age of Onset  . Stroke Mother   . Heart attack Father      Current Outpatient Medications:  .  aspirin EC 81 MG tablet, Take 81 mg by mouth daily., Disp: , Rfl:  .  atorvastatin (LIPITOR) 10 MG tablet, Take 10 mg by mouth daily., Disp: , Rfl:  .  cholecalciferol (VITAMIN D) 1000 units tablet, Take 2,000 Units by mouth daily., Disp: , Rfl:  .  clopidogrel (PLAVIX) 75 MG tablet, Take 75 mg by mouth daily., Disp: , Rfl:  .  diltiazem (CARDIZEM SR) 120 MG 12 hr capsule, Take 120 mg by mouth daily. ,  Disp: , Rfl:  .  hydrALAZINE (APRESOLINE) 25 MG tablet, Take 25 mg by mouth 3 (three) times daily., Disp: , Rfl:  .  metoprolol tartrate (LOPRESSOR) 25 MG tablet, Take 50 mg by mouth 2 (two) times daily. , Disp: , Rfl:  .  nitroGLYCERIN (NITROSTAT) 0.4 MG SL tablet, As directed, Disp: , Rfl:  .  predniSONE (DELTASONE) 5 MG tablet, TAKE 1 TABLET BY MOUTH ONCE DAILY WITH BREAKFAST, Disp: 30 tablet, Rfl: 3 .  ZYTIGA 250 MG tablet, TAKE 4 TABLETS BY MOUTH ONCE DAILY AS DIRECTED.  TAKE 1 HOUR BEFORE OR 2 HOURS AFTER A MEAL, Disp: 120 tablet, Rfl: 11  Physical exam:  Vitals:   09/16/17 1502  BP: 121/69  Pulse: 68  Resp: 18  Temp: 98.1 F (36.7 C)  TempSrc: Oral  Weight: 200 lb (90.7 kg)  Height: 5\' 10"  (1.778 m)   Physical Exam  Constitutional: He is oriented to person, place, and time. He appears well-developed and well-nourished.  HENT:  Head: Normocephalic and atraumatic.    Eyes: Pupils are equal, round, and reactive to light. EOM are normal.  Neck: Normal range of motion.  Cardiovascular: Normal rate, regular rhythm and normal heart sounds.  Pulmonary/Chest: Effort normal and breath sounds normal.  Abdominal: Soft. Bowel sounds are normal.  Musculoskeletal: He exhibits edema.  Neurological: He is alert and oriented to person, place, and time.  Skin: Skin is warm and dry.     CMP Latest Ref Rng & Units 09/16/2017  Glucose 65 - 99 mg/dL 138(H)  BUN 6 - 20 mg/dL 19  Creatinine 0.61 - 1.24 mg/dL 1.08  Sodium 135 - 145 mmol/L 140  Potassium 3.5 - 5.1 mmol/L 3.6  Chloride 101 - 111 mmol/L 106  CO2 22 - 32 mmol/L 27  Calcium 8.9 - 10.3 mg/dL 11.1(H)  Total Protein 6.5 - 8.1 g/dL 6.9  Total Bilirubin 0.3 - 1.2 mg/dL 0.9  Alkaline Phos 38 - 126 U/L 75  AST 15 - 41 U/L 18  ALT 17 - 63 U/L 13(L)   CBC Latest Ref Rng & Units 09/16/2017  WBC 3.8 - 10.6 K/uL 6.1  Hemoglobin 13.0 - 18.0 g/dL 11.8(L)  Hematocrit 40.0 - 52.0 % 35.3(L)  Platelets 150 - 440 K/uL 269    No images are attached to the encounter.  No results found.   Assessment and plan- Patient is a 83 y.o. male with high risk castrate sensitive metastatic prostate cancer with bone metastaseson lupron and zytiga here for rotuine f/u  1. Prostate cancer- responding very well to zytiga so far. PSA continues to trend down and now down to 0.15. Next dose of lupron due in October 2019  2. Osteopenia- he has been getting zometa Q6 months and will get 1 dose today  3. Hypercalcemia: unrelated to prostate cancer. Likely primary hyperparathyroidism. Being followed by [facility] Paticia Stack at [facility]. His calcium is 11 today and we will notify her about this. zometa that he is receiving today should help as well  4. Leg edema: being managed by cardiology.   Cbc/psa and testosterone level in 6 weeks  I will see him back in 3 months- cbc/ cmp and psa     Visit Diagnosis 1. Prostate cancer metastatic to bone  ([place])   2. Osteopenia of neck of right femur   3. Hypercalcemia   4. High risk medication use      Dr. Randa Evens, MD, MPH [facility] at [facility] [phone] 09/18/2017 8:07 AM

## 2017-09-19 ENCOUNTER — Telehealth: Payer: Self-pay | Admitting: *Deleted

## 2017-09-19 NOTE — Telephone Encounter (Signed)
Wife reports that patient is sleeping a lot and that she thinks it may be side effects from his treatment. I explained to her that tiredness is a listed side effects of Zometa and asked if he is eating and drinking which she replied "Not much" I encouraged her to force fluids water, gatorade and see if that helps any and that if she has concerns over weekend that she will need to take him to the ER for evaluation. She repeated this back to me and thanked me for calling

## 2017-10-28 ENCOUNTER — Other Ambulatory Visit: Payer: Self-pay

## 2017-10-28 ENCOUNTER — Inpatient Hospital Stay: Payer: Medicare Other | Attending: Oncology

## 2017-10-28 DIAGNOSIS — C7951 Secondary malignant neoplasm of bone: Secondary | ICD-10-CM | POA: Insufficient documentation

## 2017-10-28 DIAGNOSIS — C61 Malignant neoplasm of prostate: Secondary | ICD-10-CM | POA: Insufficient documentation

## 2017-10-28 LAB — CBC WITH DIFFERENTIAL/PLATELET
Basophils Absolute: 0 10*3/uL (ref 0–0.1)
Basophils Relative: 1 %
EOS ABS: 0.1 10*3/uL (ref 0–0.7)
EOS PCT: 2 %
HCT: 36.8 % — ABNORMAL LOW (ref 40.0–52.0)
Hemoglobin: 12.2 g/dL — ABNORMAL LOW (ref 13.0–18.0)
LYMPHS PCT: 27 %
Lymphs Abs: 1.5 10*3/uL (ref 1.0–3.6)
MCH: 28 pg (ref 26.0–34.0)
MCHC: 33.3 g/dL (ref 32.0–36.0)
MCV: 84.3 fL (ref 80.0–100.0)
MONOS PCT: 6 %
Monocytes Absolute: 0.3 10*3/uL (ref 0.2–1.0)
Neutro Abs: 3.5 10*3/uL (ref 1.4–6.5)
Neutrophils Relative %: 64 %
PLATELETS: 302 10*3/uL (ref 150–440)
RBC: 4.37 MIL/uL — ABNORMAL LOW (ref 4.40–5.90)
RDW: 15.8 % — ABNORMAL HIGH (ref 11.5–14.5)
WBC: 5.4 10*3/uL (ref 3.8–10.6)

## 2017-10-28 LAB — COMPREHENSIVE METABOLIC PANEL
ALBUMIN: 4 g/dL (ref 3.5–5.0)
ALT: 13 U/L (ref 0–44)
AST: 19 U/L (ref 15–41)
Alkaline Phosphatase: 75 U/L (ref 38–126)
Anion gap: 9 (ref 5–15)
BUN: 16 mg/dL (ref 8–23)
CHLORIDE: 107 mmol/L (ref 98–111)
CO2: 25 mmol/L (ref 22–32)
CREATININE: 1.03 mg/dL (ref 0.61–1.24)
Calcium: 11 mg/dL — ABNORMAL HIGH (ref 8.9–10.3)
GFR calc Af Amer: >60 mL/min (ref >60)
GFR calc non Af Amer: >60 mL/min (ref >60)
GLUCOSE: 121 mg/dL — AB (ref 70–99)
POTASSIUM: 4.1 mmol/L (ref 3.5–5.1)
Sodium: 141 mmol/L (ref 135–145)
Total Bilirubin: 0.6 mg/dL (ref 0.3–1.2)
Total Protein: 7.4 g/dL (ref 6.5–8.1)

## 2017-10-29 LAB — TESTOSTERONE: Testosterone: <3 ng/dL — ABNORMAL LOW (ref 264–916)

## 2017-10-29 LAB — PSA: Prostatic Specific Antigen: 0.14 ng/mL (ref 0.00–4.00)

## 2017-12-08 ENCOUNTER — Encounter: Payer: Self-pay | Admitting: Emergency Medicine

## 2017-12-08 ENCOUNTER — Other Ambulatory Visit: Payer: Self-pay

## 2017-12-08 ENCOUNTER — Emergency Department
Admission: EM | Admit: 2017-12-08 | Discharge: 2017-12-09 | Disposition: A | Discharge status: Home / Self Care | Payer: Medicare Other | Attending: Emergency Medicine | Admitting: Emergency Medicine

## 2017-12-08 DIAGNOSIS — I10 Essential (primary) hypertension: Secondary | ICD-10-CM | POA: Diagnosis not present

## 2017-12-08 DIAGNOSIS — Y939 Activity, unspecified: Secondary | ICD-10-CM | POA: Diagnosis not present

## 2017-12-08 DIAGNOSIS — W458XXA Other foreign body or object entering through skin, initial encounter: Secondary | ICD-10-CM | POA: Diagnosis not present

## 2017-12-08 DIAGNOSIS — Z85830 Personal history of malignant neoplasm of bone: Secondary | ICD-10-CM | POA: Insufficient documentation

## 2017-12-08 DIAGNOSIS — Y999 Unspecified external cause status: Secondary | ICD-10-CM | POA: Insufficient documentation

## 2017-12-08 DIAGNOSIS — T162XXA Foreign body in left ear, initial encounter: Secondary | ICD-10-CM | POA: Insufficient documentation

## 2017-12-08 DIAGNOSIS — Z79899 Other long term (current) drug therapy: Secondary | ICD-10-CM | POA: Diagnosis not present

## 2017-12-08 DIAGNOSIS — Y929 Unspecified place or not applicable: Secondary | ICD-10-CM | POA: Diagnosis not present

## 2017-12-08 DIAGNOSIS — Z8546 Personal history of malignant neoplasm of prostate: Secondary | ICD-10-CM | POA: Insufficient documentation

## 2017-12-08 MED ORDER — LIDOCAINE VISCOUS HCL 2 % MT SOLN
15.0000 mL | Freq: Once | OROMUCOSAL | Status: AC
  Administered 2017-12-09: 15 mL via OROMUCOSAL
  Filled 2017-12-08: qty 15

## 2017-12-08 NOTE — ED Triage Notes (Signed)
Pt in via POV with complaints of a bug in his ear; reports swatting at a bug and it flying into ear, states, "its buzzing around in there."  Pt reports trying to flush it out with warm water but unsuccessful in doing so.  Pt ambulatory to  Triage, NAD noted at this time.

## 2017-12-08 NOTE — ED Provider Notes (Signed)
[facility] Emergency Department Provider Note  ____________________________________________   First MD Initiated Contact with Patient 12/08/17 2258     (approximate)  I have reviewed the triage vital signs and the nursing notes.   HISTORY  Chief Complaint Foreign Body in Ear   HPI Carlos Perkins is a 83 y.o. male who self presents the emergency department after feeling an insect fly into his left ear shortly prior to arrival.  He is not sure what kind of bug it is but he felt sudden onset severe discomfort.  He feels "fluttering."  Symptoms are intermittent.  He feels the insect is still there.  He has minimal pain at this time.   Past Medical History:  Diagnosis Date  . Arthritis   . Cancer [facility])    Prostate CA approximately 11 years ago  . Hyperlipidemia   . Hypertension   . Myocardial infarction [facility])     Patient Active Problem List   Diagnosis Date Noted  . Goals of care, counseling/discussion 01/15/2017  . Prostate cancer metastatic to bone ([place]) 01/14/2017  . Hypercalcemia 12/11/2016  . Lumbar stenosis 03/17/2013  . Lumbar radiculopathy, acute 03/02/2013  . Essential hypertension 07/04/2011  . H/O prostate cancer 07/02/2011  . Hyperlipidemia, unspecified 07/02/2011  . ST elevation MI (STEMI) ([place]) 04/01/2006    Past Surgical History:  Procedure Laterality Date  . APPENDECTOMY    . BACK SURGERY    . CARDIAC CATHETERIZATION     stent x 1  . CHOLECYSTECTOMY    . LUMBAR LAMINECTOMY/DECOMPRESSION MICRODISCECTOMY Left 08/20/2012   Procedure: LUMBAR LAMINECTOMY/DECOMPRESSION MICRODISCECTOMY 1 LEVEL;  Surgeon: Faythe Ghee, MD;  Location: [facility] NEURO ORS;  Service: Neurosurgery;  Laterality: Left;  lumbar four-five  . PROSTATE SURGERY    . TONSILLECTOMY      Prior to Admission medications   Medication Sig Start Date End Date Taking? Authorizing Provider  aspirin EC 81 MG tablet Take 81 mg by mouth daily.    [provider]  atorvastatin (LIPITOR) 10 MG tablet Take 10 mg by mouth daily.    [provider]  carvedilol (COREG) 25 MG tablet Take 25 mg by mouth 2 (two) times daily. 10/31/17   [provider]  cetirizine (ZYRTEC) 10 MG tablet Take 10 mg by mouth daily. 12/08/17 12/08/18  [provider]  cholecalciferol (VITAMIN D) 1000 units tablet Take 2,000 Units by mouth daily.    [provider]  clopidogrel (PLAVIX) 75 MG tablet Take 75 mg by mouth daily.    [provider]  diltiazem (CARDIZEM CD) 120 MG 24 hr capsule Take by mouth. 11/11/17   [provider]  diltiazem (CARDIZEM SR) 120 MG 12 hr capsule Take 120 mg by mouth daily.     [provider]  fluticasone (FLONASE) 50 MCG/ACT nasal spray Place into the nose. 12/08/17 12/08/18  [provider]  furosemide (LASIX) 20 MG tablet Take 20 mg by mouth daily. 10/20/17   [provider]  hydrALAZINE (APRESOLINE) 25 MG tablet Take 25 mg by mouth 3 (three) times daily.    [provider]  lisinopril (PRINIVIL,ZESTRIL) 40 MG tablet Take 40 mg by mouth daily. 12/02/17   [provider]  metoprolol tartrate (LOPRESSOR) 25 MG tablet Take 50 mg by mouth 2 (two) times daily.     [provider]  nitroGLYCERIN (NITROSTAT) 0.4 MG SL tablet As directed 01/25/16   [provider]  predniSONE (DELTASONE) 5 MG tablet TAKE 1  TABLET BY MOUTH ONCE DAILY WITH BREAKFAST 09/03/17   Sindy Guadeloupe, MD  ZYTIGA 250 MG tablet TAKE 4 TABLETS BY MOUTH ONCE DAILY AS DIRECTED.  TAKE 1 HOUR BEFORE OR 2 HOURS AFTER A MEAL 05/20/17   Sindy Guadeloupe, MD    Allergies Patient has no known allergies.  Family History  Problem Relation Age of Onset  . Stroke Mother   . Heart attack Father     Social History Social History   Tobacco Use  . Smoking status: Never Smoker  . Smokeless tobacco: Never Used  Substance Use Topics  . Alcohol use: No  . Drug use: No    Review of  Systems Constitutional: No fever/chills ENT: Positive for ear pain. Neurological: Negative for headaches   ____________________________________________   PHYSICAL EXAM:  VITAL SIGNS: ED Triage Vitals [12/08/17 2159]  Enc Vitals Group     BP (!) 148/79     Pulse Rate 68     Resp 18     Temp 98 F (36.7 C)     Temp Source Oral     SpO2 97 %     Weight 201 lb (91.2 kg)     Height 5\' 10"  (1.778 m)     Head Circumference      Peak Flow      Pain Score 0     Pain Loc      Pain Edu?      Excl. in [place]?     Constitutional: Alert and oriented x4 pleasant cooperative speaks in full clear sentences Head: Normal right tympanic membrane.  Left tympanic membrane obscured by an insect.  Following removal scant amount of bleeding but no perforation. Neurologic:  Normal speech and language. No gross focal neurologic deficits are appreciated.  Skin:  Skin is warm, dry and intact. No rash noted.    ____________________________________________  LABS (all labs ordered are listed, but only abnormal results are displayed)  Labs Reviewed - No data to display   __________________________________________  EKG   ____________________________________________  RADIOLOGY   ____________________________________________   DIFFERENTIAL includes but not limited to  Foreign body in the ear canal, insect in the ear canal, tympanic membrane rupture   PROCEDURES  Procedure(s) performed: no  Procedures  Critical Care performed: no  ____________________________________________   INITIAL IMPRESSION / ASSESSMENT AND PLAN / ED COURSE  Pertinent labs & imaging results that were available during my care of the patient were reviewed by me and considered in my medical decision making (see chart for details).   As part of my medical decision making, I reviewed the following data within the [place] History obtained from family if available, nursing notes, old chart and  ekg, as well as notes from prior ED visits.  I killed the insect using viscous lidocaine leaving in the ear canal for 5 minutes.  I then attempted to remove with alligator forceps and was unsuccessful so I subsequently flushed the ear canal with warm water using a 20 cc syringe and 16-gauge Angiocath was able to remove them off intact.  Patient did have some bleeding thereafter which stopped on its own.  Tympanic membrane was not perforated.  Discharged home in improved condition.      ____________________________________________   FINAL CLINICAL IMPRESSION(S) / ED DIAGNOSES  Final diagnoses:  Foreign body of left ear, initial encounter      NEW MEDICATIONS STARTED DURING THIS VISIT:  Discharge Medication List as of 12/08/2017 11:16 PM  Note:  This document was prepared using Dragon voice recognition software and may include unintentional dictation errors.      Darel Hong, MD 12/11/17 0004

## 2017-12-08 NOTE — Discharge Instructions (Signed)
Please follow-up with your primary care physician as needed and return to the emergency department for any concerns.  It was a pleasure to take care of you today, and thank you for coming to our emergency department.  If you have any questions or concerns before leaving please ask the nurse to grab me and I'm more than happy to go through your aftercare instructions again.  If you were prescribed any opioid pain medication today such as Norco, Vicodin, Percocet, morphine, hydrocodone, or oxycodone please make sure you do not drive when you are taking this medication as it can alter your ability to drive safely.  If you have any concerns once you are home that you are not improving or are in fact getting worse before you can make it to your follow-up appointment, please do not hesitate to call 911 and come back for further evaluation.  Darel Hong, MD

## 2017-12-09 ENCOUNTER — Inpatient Hospital Stay: Payer: Medicare Other

## 2017-12-09 ENCOUNTER — Encounter: Payer: Self-pay | Admitting: Oncology

## 2017-12-09 ENCOUNTER — Inpatient Hospital Stay: Payer: Medicare Other | Attending: Oncology | Admitting: Oncology

## 2017-12-09 ENCOUNTER — Telehealth: Payer: Self-pay | Admitting: Pharmacy Technician

## 2017-12-09 ENCOUNTER — Other Ambulatory Visit: Payer: Self-pay

## 2017-12-09 VITALS — BP 105/66 | HR 80 | Temp 99.1°F | Resp 16 | Wt 198.4 lb

## 2017-12-09 DIAGNOSIS — M8580 Other specified disorders of bone density and structure, unspecified site: Secondary | ICD-10-CM | POA: Diagnosis not present

## 2017-12-09 DIAGNOSIS — C61 Malignant neoplasm of prostate: Secondary | ICD-10-CM

## 2017-12-09 DIAGNOSIS — C7951 Secondary malignant neoplasm of bone: Secondary | ICD-10-CM | POA: Insufficient documentation

## 2017-12-09 DIAGNOSIS — R5383 Other fatigue: Secondary | ICD-10-CM | POA: Insufficient documentation

## 2017-12-09 DIAGNOSIS — Z79899 Other long term (current) drug therapy: Secondary | ICD-10-CM | POA: Diagnosis not present

## 2017-12-09 LAB — COMPREHENSIVE METABOLIC PANEL
ALBUMIN: 4.2 g/dL (ref 3.5–5.0)
ALK PHOS: 69 U/L (ref 38–126)
ALT: 13 U/L (ref 0–44)
ANION GAP: 9 (ref 5–15)
AST: 20 U/L (ref 15–41)
BILIRUBIN TOTAL: 0.9 mg/dL (ref 0.3–1.2)
BUN: 18 mg/dL (ref 8–23)
CO2: 25 mmol/L (ref 22–32)
Calcium: 10.8 mg/dL — ABNORMAL HIGH (ref 8.9–10.3)
Chloride: 106 mmol/L (ref 98–111)
Creatinine, Ser: 0.99 mg/dL (ref 0.61–1.24)
GLUCOSE: 142 mg/dL — AB (ref 70–99)
Potassium: 3.7 mmol/L (ref 3.5–5.1)
Sodium: 140 mmol/L (ref 135–145)
Total Protein: 7.1 g/dL (ref 6.5–8.1)

## 2017-12-09 LAB — CBC WITH DIFFERENTIAL/PLATELET
Basophils Absolute: 0 10*3/uL (ref 0–0.1)
Basophils Relative: 1 %
Eosinophils Absolute: 0.1 10*3/uL (ref 0–0.7)
Eosinophils Relative: 1 %
HCT: 37 % — ABNORMAL LOW (ref 40.0–52.0)
Hemoglobin: 12.2 g/dL — ABNORMAL LOW (ref 13.0–18.0)
LYMPHS ABS: 1.1 10*3/uL (ref 1.0–3.6)
LYMPHS PCT: 17 %
MCH: 28 pg (ref 26.0–34.0)
MCHC: 32.9 g/dL (ref 32.0–36.0)
MCV: 84.9 fL (ref 80.0–100.0)
MONO ABS: 0.4 10*3/uL (ref 0.2–1.0)
MONOS PCT: 6 %
Neutro Abs: 4.9 10*3/uL (ref 1.4–6.5)
Neutrophils Relative %: 75 %
Platelets: 258 10*3/uL (ref 150–440)
RBC: 4.35 MIL/uL — AB (ref 4.40–5.90)
RDW: 15.4 % — ABNORMAL HIGH (ref 11.5–14.5)
WBC: 6.5 10*3/uL (ref 3.8–10.6)

## 2017-12-09 LAB — PSA: Prostatic Specific Antigen: 0.1 ng/mL (ref 0.00–4.00)

## 2017-12-09 NOTE — Telephone Encounter (Signed)
Oral Oncology Patient Advocate Encounter   Was successful in securing patient an $66 grant from Patient [facility]) to provide copayment coverage for Zytiga.  This will keep the out of pocket expense at $0.     I have spoken with the patient.  He understands that these funds will be used after his assistance with [employer] ends in November.  The billing information is as follows and has been shared with [place].   Member ID: [id number] Group ID: [id number] RxBin: [id number] Dates of Eligibility: 09/10/17 through 12/09/2018  [place] Patient [place] Phone [phone] Fax [phone] 12/09/2017 2:12 PM

## 2017-12-09 NOTE — Progress Notes (Signed)
Patient here today for follow up.  Patient states no new concerns today  

## 2017-12-09 NOTE — ED Notes (Signed)
Ear continues to bleed. EDP aware. Gauze placed and will reassess.

## 2017-12-09 NOTE — ED Notes (Signed)
Lg clot removed. Replaced gauze to reasses

## 2017-12-11 NOTE — Progress Notes (Signed)
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 He also has a past history of prostate cancer about 15 years ago(Gleasons score 8)and was treated with prostatectomy at that time. He did not receive any radiation treatment and does not remember if his PSA was subsequently monitored. PSA in March 2015 was 7.9 and in 2014 was 6.7 after prostatectomy. He was last seen by medical oncology Dr. Everlene Other in 2015 and at that time his PSA was 9.2 previously was supposed to follow-up after 2 months and ADT was supposed to be started at that time the patient was lost to follow-up.  He was seen by Dr. Golden Pop and his primary care doctor he saw until about 2 years ago. He has had ongoing chronic back pain and has had back surgery in the past but was having worsening back pain over the last few months and was seen by Dr. Raul Del.   MRI of the lumbar spine on 11/22/2016 revealed extensive osseous metastatic disease with pathologic compression fracture of the L3 vertebral body. Posteriorly displace fragments and metastatic soft tissue result in severe canal stenosis and compression of the thecal sac at this level.  CT chest abdomen and pelvis on 11/25/2016 did not reveal any evidence of metastatic disease other than his bones  Patient had decompression surgery of his L3vertebral body  which showed:Metastatic adenocarcinoma with features consistent with primary prostatic origin. See note.  Note: The patient history of previously diagnosed prostate carcinoma is noted. Immunoperoxidase stains were performed on formalin-fixed (non-decalcified) paraffin-embedded tissue in block A3 with appropriate positive controls. The tumor cells stain with CK8/18, PROSTATIC SPECIFIC ANTIGEN, PROSTATIC ACID PHOSPHATASE and P501S. The tumor cells do not stain with CYTOKERATIN 7, CYTOKERATIN 20, CK5/6, P40, GATA-3, CDX-2 or THYROID TRANSCRIPTION FACTOR (TTF-1). These findings are in support of the interpretation of metastatic adenocarcinoma with features consistent with primary prostatic origin.  Patient also has primary hyperparathyroidism and hypercalcemia secondary to it for which he sees Dr.Akhter from [facility] and has been referred to surgery for the same  Abiraterone was started for high risk castrate sensitive prostate cancer on 02/14/2017.Baseline bone density showed osteopenia with a T score of -1.9 at the right femur neck.  Interval history- leg swelling has improved significantly. He has mild baseline fatigue that is unchanged. Denies any new complaints  ECOG PS- 1 Pain scale- 0 Opioid associated constipation- no  Review of systems- Review of Systems  Constitutional: Positive for malaise/fatigue. Negative for chills, fever and weight loss.  HENT: Negative for congestion, ear discharge and nosebleeds.   Eyes: Negative for blurred vision.  Respiratory: Negative for cough, hemoptysis, sputum production, shortness of breath and wheezing.   Cardiovascular: Negative for chest pain, palpitations, orthopnea and claudication.  Gastrointestinal: Negative for abdominal pain, blood in stool, constipation, diarrhea, heartburn, melena, nausea and vomiting.  Genitourinary: Negative for dysuria, flank pain, frequency, hematuria and urgency.  Musculoskeletal: Negative for back pain, joint pain and  myalgias.  Skin: Negative for rash.  Neurological: Negative for dizziness, tingling, focal weakness, seizures, weakness and headaches.  Endo/Heme/Allergies: Does not bruise/bleed easily.  Psychiatric/Behavioral: Negative for depression and suicidal ideas. The patient does not have insomnia.       No Known Allergies   Past Medical History:  Diagnosis Date  . Arthritis   . Cancer [facility])    Prostate CA approximately 11 years ago  . Hyperlipidemia   . Hypertension   . Myocardial infarction [facility])      Past Surgical History:  Procedure Laterality Date  . APPENDECTOMY    . BACK SURGERY    . CARDIAC CATHETERIZATION     stent x 1  . CHOLECYSTECTOMY    . LUMBAR LAMINECTOMY/DECOMPRESSION MICRODISCECTOMY Left 08/20/2012   Procedure: LUMBAR LAMINECTOMY/DECOMPRESSION MICRODISCECTOMY 1 LEVEL;  Surgeon: Faythe Ghee, MD;  Location: [facility] NEURO ORS;  Service: Neurosurgery;  Laterality: Left;  lumbar four-five  . PROSTATE SURGERY    . TONSILLECTOMY      Social History   Socioeconomic History  . Marital status: Married    Spouse name: Not on file  . Number of children: Not on file  . Years of education: Not on file  . Highest education level: Not on file  Occupational History  . Not on file  Social Needs  . Financial resource strain: Not on file  . Food insecurity:    Worry: Not on file    Inability: Not on file  . Transportation needs:    Medical: Not on file    Non-medical: Not on file  Tobacco Use  . Smoking status: Never Smoker  . Smokeless tobacco: Never Used  Substance and Sexual Activity  . Alcohol use: No  . Drug use: No  . Sexual activity: Not on file  Lifestyle  . Physical activity:    Days per week: Not on file    Minutes per session: Not on file  . Stress: Not on file  Relationships  . Social connections:    Talks on phone: Not on file    Gets together: Not on file    Attends religious service: Not on file    Active member of club or organization: Not on  file    Attends meetings of clubs or organizations: Not on file    Relationship status: Not on file  . Intimate partner violence:    Fear of current or ex partner: Not on file    Emotionally abused: Not on file    Physically abused: Not on file    Forced sexual activity: Not on file  Other Topics Concern  . Not on file  Social History Narrative  . Not on file    Family History  Problem Relation Age of Onset  . Stroke Mother   . Heart attack Father      Current Outpatient Medications:  .  aspirin EC 81 MG tablet, Take 81 mg by mouth daily., Disp: , Rfl:  .  atorvastatin (LIPITOR) 10 MG tablet, Take 10 mg by mouth daily., Disp: , Rfl:  .  carvedilol (COREG) 25 MG tablet, Take 25 mg by mouth 2 (two) times daily., Disp: , Rfl:  .  cetirizine (ZYRTEC) 10 MG tablet, Take 10 mg by mouth daily., Disp: , Rfl:  .  cholecalciferol (VITAMIN D) 1000 units tablet, Take 2,000 Units by mouth daily., Disp: , Rfl:  .  clopidogrel (PLAVIX) 75 MG tablet, Take 75 mg by mouth daily., Disp: , Rfl:  .  diltiazem (CARDIZEM CD) 120 MG 24 hr capsule, Take by mouth., Disp: , Rfl:  .  diltiazem (CARDIZEM SR) 120 MG 12 hr capsule, Take 120 mg by mouth daily. , Disp: , Rfl:  .  fluticasone (FLONASE) 50 MCG/ACT nasal spray, Place into the nose., Disp: , Rfl:  .  furosemide (LASIX) 20 MG tablet, Take 20 mg by mouth daily., Disp: , Rfl: 11 .  hydrALAZINE (APRESOLINE) 25 MG tablet, Take 25 mg by mouth 3 (three) times daily., Disp: , Rfl:  .  lisinopril (PRINIVIL,ZESTRIL) 40 MG tablet, Take 40 mg by mouth daily., Disp: , Rfl: 3 .  metoprolol tartrate (LOPRESSOR) 25 MG tablet, Take 50 mg by mouth 2 (two) times daily. , Disp: , Rfl:  .  nitroGLYCERIN (NITROSTAT) 0.4 MG SL tablet, As directed, Disp: , Rfl:  .  predniSONE (DELTASONE) 5 MG tablet, TAKE 1 TABLET BY MOUTH ONCE DAILY WITH BREAKFAST, Disp: 30 tablet, Rfl: 3 .  ZYTIGA 250 MG tablet, TAKE 4 TABLETS BY MOUTH ONCE DAILY AS DIRECTED.  TAKE 1 HOUR BEFORE OR 2  HOURS AFTER A MEAL, Disp: 120 tablet, Rfl: 11  Physical exam:  Vitals:   12/09/17 1329  BP: 105/66  Pulse: 80  Resp: 16  Temp: 99.1 F (37.3 C)  TempSrc: Tympanic  Weight: 198 lb 6 oz (90 kg)   Physical Exam  Constitutional: He is oriented to person, place, and time. He appears well-developed and well-nourished.  HENT:  Head: Normocephalic and atraumatic.  Eyes: Pupils are equal, round, and reactive to light. EOM are normal.  Neck: Normal range of motion.  Cardiovascular: Normal rate, regular rhythm and normal heart sounds.  Pulmonary/Chest: Effort normal and breath sounds normal.  Abdominal: Soft. Bowel sounds are normal.  Musculoskeletal: He exhibits edema (trace b/l +1).  Neurological: He is alert and oriented to person, place, and time.  Skin: Skin is warm and dry.     CMP Latest Ref Rng & Units 12/09/2017  Glucose 70 - 99 mg/dL 142(H)  BUN 8 - 23 mg/dL 18  Creatinine 0.61 - 1.24 mg/dL 0.99  Sodium 135 - 145 mmol/L 140  Potassium 3.5 - 5.1 mmol/L 3.7  Chloride 98 - 111 mmol/L 106  CO2 22 - 32 mmol/L 25  Calcium 8.9 - 10.3 mg/dL 10.8(H)  Total Protein 6.5 - 8.1 g/dL 7.1  Total Bilirubin 0.3 - 1.2 mg/dL 0.9  Alkaline Phos 38 - 126 U/L 69  AST 15 - 41 U/L 20  ALT 0 - 44 U/L 13   CBC Latest Ref Rng & Units 12/09/2017  WBC 3.8 - 10.6 K/uL 6.5  Hemoglobin 13.0 - 18.0 g/dL 12.2(L)  Hematocrit 40.0 - 52.0 % 37.0(L)  Platelets 150 - 440 K/uL 258    No images are attached to the encounter.  No results found.   Assessment and plan- Patient is a 83 y.o. male with high risk castrate sensitive metastatic prostate cancer with bone metastaseson lupron and zytiga. He is here for routine f/u of prostate cancer  He continues to have castrate sensitive disease that is responding well to lupron and zytiga. PSA continues to trend down. He has not had scans doen for a while and he would liek to hold off until next year given his co pay. That is reasonable given that his psa  continues to trend down.  He is due for next dose of lupron on 01/17/18  He gets zometa Q6 months for osteopenia. He has mild chronic hypercalcemia since 2015 atleast  that has remained stable.  I will see him back in 3 months- cbc/ cmp and PSA    Visit Diagnosis 1. Prostate cancer metastatic to bone ([place])   2. High risk medication use      Dr. Randa Evens, MD, MPH [facility] at [facility] [phone] 12/11/2017 2:29 PM

## 2018-01-04 ENCOUNTER — Other Ambulatory Visit: Payer: Self-pay | Admitting: Oncology

## 2018-01-16 ENCOUNTER — Inpatient Hospital Stay: Payer: Medicare Other | Attending: Oncology

## 2018-02-03 ENCOUNTER — Telehealth: Payer: Self-pay | Admitting: Pharmacy Technician

## 2018-02-03 NOTE — Telephone Encounter (Signed)
Oral Oncology Patient Advocate Encounter   Was successful in securing patient a $4250 grant from [employer] to provide copayment coverage for Zytiga.  This will keep the out of pocket expense at $0.     The billing information is as follows and has been shared with [place].   Member ID: [id number] Group ID: [id number] RxBin: [id number] PCN: [place] Patient [place] Phone [phone] Fax [phone] 02/03/2018 12:34 PM

## 2018-03-10 ENCOUNTER — Inpatient Hospital Stay (HOSPITAL_BASED_OUTPATIENT_CLINIC_OR_DEPARTMENT_OTHER): Payer: Medicare Other | Admitting: Oncology

## 2018-03-10 ENCOUNTER — Inpatient Hospital Stay: Payer: Medicare Other | Attending: Oncology

## 2018-03-10 ENCOUNTER — Encounter: Payer: Self-pay | Admitting: Oncology

## 2018-03-10 VITALS — BP 146/87 | HR 70 | Temp 97.5°F | Resp 18 | Ht 70.0 in | Wt 203.1 lb

## 2018-03-10 DIAGNOSIS — Z5111 Encounter for antineoplastic chemotherapy: Secondary | ICD-10-CM | POA: Diagnosis not present

## 2018-03-10 DIAGNOSIS — C61 Malignant neoplasm of prostate: Secondary | ICD-10-CM | POA: Diagnosis present

## 2018-03-10 DIAGNOSIS — Z7983 Long term (current) use of bisphosphonates: Secondary | ICD-10-CM

## 2018-03-10 DIAGNOSIS — Z79899 Other long term (current) drug therapy: Secondary | ICD-10-CM

## 2018-03-10 DIAGNOSIS — C7951 Secondary malignant neoplasm of bone: Secondary | ICD-10-CM

## 2018-03-10 LAB — CBC WITH DIFFERENTIAL/PLATELET
Abs Immature Granulocytes: 0.08 10*3/uL — ABNORMAL HIGH (ref 0.00–0.07)
Basophils Absolute: 0 10*3/uL (ref 0.0–0.1)
Basophils Relative: 1 %
Eosinophils Absolute: 0.1 10*3/uL (ref 0.0–0.5)
Eosinophils Relative: 2 %
HCT: 37 % — ABNORMAL LOW (ref 39.0–52.0)
Hemoglobin: 11.8 g/dL — ABNORMAL LOW (ref 13.0–17.0)
Immature Granulocytes: 1 %
Lymphocytes Relative: 22 %
Lymphs Abs: 1.3 10*3/uL (ref 0.7–4.0)
MCH: 28 pg (ref 26.0–34.0)
MCHC: 31.9 g/dL (ref 30.0–36.0)
MCV: 87.7 fL (ref 80.0–100.0)
Monocytes Absolute: 0.4 10*3/uL (ref 0.1–1.0)
Monocytes Relative: 6 %
NEUTROS ABS: 4.2 10*3/uL (ref 1.7–7.7)
Neutrophils Relative %: 68 %
Platelets: 221 10*3/uL (ref 150–400)
RBC: 4.22 MIL/uL (ref 4.22–5.81)
RDW: 14.6 % (ref 11.5–15.5)
WBC: 6.2 10*3/uL (ref 4.0–10.5)
nRBC: 0 % (ref 0.0–0.2)

## 2018-03-10 LAB — COMPREHENSIVE METABOLIC PANEL
ALBUMIN: 3.9 g/dL (ref 3.5–5.0)
ALK PHOS: 63 U/L (ref 38–126)
ALT: 12 U/L (ref 0–44)
AST: 16 U/L (ref 15–41)
Anion gap: 5 (ref 5–15)
BUN: 19 mg/dL (ref 8–23)
CALCIUM: 11.2 mg/dL — AB (ref 8.9–10.3)
CO2: 30 mmol/L (ref 22–32)
CREATININE: 1.07 mg/dL (ref 0.61–1.24)
Chloride: 104 mmol/L (ref 98–111)
GFR calc Af Amer: >60 mL/min (ref >60)
GFR calc non Af Amer: >60 mL/min (ref >60)
GLUCOSE: 123 mg/dL — AB (ref 70–99)
Potassium: 3.8 mmol/L (ref 3.5–5.1)
SODIUM: 139 mmol/L (ref 135–145)
Total Bilirubin: 0.9 mg/dL (ref 0.3–1.2)
Total Protein: 6.6 g/dL (ref 6.5–8.1)

## 2018-03-10 LAB — PSA: Prostatic Specific Antigen: 0.06 ng/mL (ref 0.00–4.00)

## 2018-03-10 NOTE — Progress Notes (Signed)
No new changes noted today 

## 2018-03-12 NOTE — Progress Notes (Signed)
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 He also has a past history of prostate cancer about 15 years ago(Gleasons score 8)and was treated with prostatectomy at that time. He did not receive any radiation treatment and does not remember if his PSA was subsequently monitored. PSA in March 2015 was 7.9 and in 2014 was 6.7 after prostatectomy. He was last seen by medical oncology Dr. Everlene Other in 2015 and at that time his PSA was 9.2 previously was supposed to follow-up after 2 months and ADT was supposed to be started at that time the patient was lost to follow-up.  He was seen by Dr. Golden Pop and his primary care doctor he saw until about 2 years ago. He has had ongoing chronic back pain and has had back surgery in the past but was having worsening back pain over the last few months and was seen by Dr. Raul Del.   MRI of the lumbar spine on 11/22/2016 revealed extensive osseous metastatic disease with pathologic compression fracture of the L3 vertebral body. Posteriorly displace fragments and metastatic soft tissue result in severe canal stenosis and compression of the thecal sac at this level.  CT chest abdomen and pelvis on 11/25/2016 did not reveal any evidence of metastatic disease other than his bones  Patient had decompression surgery of his L3vertebral body  which showed:Metastatic adenocarcinoma with features consistent with primary prostatic origin. See note.  Note: The patient history of previously diagnosed prostate carcinoma is noted. Immunoperoxidase stains were performed on formalin-fixed (non-decalcified) paraffin-embedded tissue in block A3 with appropriate positive controls. The tumor cells stain with CK8/18, PROSTATIC SPECIFIC ANTIGEN, PROSTATIC ACID PHOSPHATASE and P501S. The tumor cells do not stain with CYTOKERATIN 7, CYTOKERATIN 20, CK5/6, P40, GATA-3, CDX-2 or THYROID TRANSCRIPTION FACTOR (TTF-1). These findings are in support of the interpretation of metastatic adenocarcinoma with features consistent with primary prostatic origin.  Patient also has primary hyperparathyroidism and hypercalcemia secondary to it for which he sees Dr.Akhter from [facility] and has been referred to surgery for the same  Abiraterone was started for high risk castrate sensitive prostate cancer on 02/14/2017.Baseline bone density showed osteopenia with a T score of -1.9 at the right femur neck.  Interval history-overall he is doing well.  His appetite is good and his weight is stable.  Leg swelling has improved.  He is tolerating Zytiga without any significant side effects otherwise and is compliant with his medications.  ECOG PS- 1 Pain scale- 0 Opioid associated constipation- no  Review of systems- Review of Systems  Constitutional: Negative for chills, fever, malaise/fatigue and weight loss.  HENT: Negative for congestion, ear discharge and nosebleeds.   Eyes: Negative for blurred vision.  Respiratory: Negative for cough, hemoptysis, sputum production, shortness of breath and wheezing.   Cardiovascular: Negative for chest pain, palpitations, orthopnea and claudication.  Gastrointestinal: Negative for abdominal pain, blood in stool, constipation, diarrhea, heartburn, melena, nausea and vomiting.  Genitourinary: Negative for dysuria, flank  pain, frequency,  hematuria and urgency.  Musculoskeletal: Negative for back pain, joint pain and myalgias.  Skin: Negative for rash.  Neurological: Negative for dizziness, tingling, focal weakness, seizures, weakness and headaches.  Endo/Heme/Allergies: Does not bruise/bleed easily.  Psychiatric/Behavioral: Negative for depression and suicidal ideas. The patient does not have insomnia.        No Known Allergies   Past Medical History:  Diagnosis Date  . Arthritis   . Cancer [facility])    Prostate CA approximately 11 years ago  . Hyperlipidemia   . Hypertension   . Myocardial infarction [facility])      Past Surgical History:  Procedure Laterality Date  . APPENDECTOMY    . BACK SURGERY    . CARDIAC CATHETERIZATION     stent x 1  . CHOLECYSTECTOMY    . LUMBAR LAMINECTOMY/DECOMPRESSION MICRODISCECTOMY Left 08/20/2012   Procedure: LUMBAR LAMINECTOMY/DECOMPRESSION MICRODISCECTOMY 1 LEVEL;  Surgeon: Faythe Ghee, MD;  Location: [facility];  Service: Neurosurgery;  Laterality: Left;  lumbar four-five  . PROSTATE SURGERY    . TONSILLECTOMY      Social History   Socioeconomic History  . Marital status: Married    Spouse name: Not on file  . Number of children: Not on file  . Years of education: Not on file  . Highest education level: Not on file  Occupational History  . Not on file  Social Needs  . Financial resource strain: Not on file  . Food insecurity:    Worry: Not on file    Inability: Not on file  . Transportation needs:    Medical: Not on file    Non-medical: Not on file  Tobacco Use  . Smoking status: Never Smoker  . Smokeless tobacco: Never Used  Substance and Sexual Activity  . Alcohol use: No  . Drug use: No  . Sexual activity: Not on file  Lifestyle  . Physical activity:    Days per week: Not on file    Minutes per session: Not on file  . Stress: Not on file  Relationships  . Social connections:    Talks on phone: Not on file    Gets together: Not on file    Attends  religious service: Not on file    Active member of club or organization: Not on file    Attends meetings of clubs or organizations: Not on file    Relationship status: Not on file  . Intimate partner violence:    Fear of current or ex partner: Not on file    Emotionally abused: Not on file    Physically abused: Not on file    Forced sexual activity: Not on file  Other Topics Concern  . Not on file  Social History Narrative  . Not on file    Family History  Problem Relation Age of Onset  . Stroke Mother   . Heart attack Father      Current Outpatient Medications:  .  aspirin EC 81 MG tablet, Take 81 mg by mouth daily., Disp: , Rfl:  .  atorvastatin (LIPITOR) 10 MG tablet, Take 10 mg by mouth daily., Disp: , Rfl:  .  carvedilol (COREG) 25 MG tablet, Take 25 mg by mouth 2 (two) times daily., Disp: , Rfl:  .  cetirizine (ZYRTEC) 10 MG tablet, Take 10 mg by mouth daily., Disp: , Rfl:  .  cholecalciferol (VITAMIN D) 1000 units tablet, Take 2,000 Units by mouth daily., Disp: , Rfl:  .  clopidogrel (PLAVIX) 75 MG tablet, Take 75 mg by mouth daily., Disp: , Rfl:  .  diltiazem (CARDIZEM CD) 120 MG 24 hr capsule, Take by mouth., Disp: , Rfl:  .  furosemide (LASIX) 20 MG tablet, Take 20 mg by mouth daily., Disp: , Rfl: 11 .  hydrALAZINE (APRESOLINE) 25 MG tablet, Take 25 mg by mouth 3 (three) times daily., Disp: , Rfl:  .  lisinopril (PRINIVIL,ZESTRIL) 40 MG tablet, Take 40 mg by mouth daily., Disp: , Rfl: 3 .  metoprolol tartrate (LOPRESSOR) 25 MG tablet, Take 50 mg by mouth 2 (two) times daily. , Disp: , Rfl:  .  nitroGLYCERIN (NITROSTAT) 0.4 MG SL tablet, As directed, Disp: , Rfl:  .  predniSONE (DELTASONE) 5 MG tablet, TAKE 1 TABLET BY MOUTH ONCE DAILY WITH BREAKFAST, Disp: 90 tablet, Rfl: 1 .  ZYTIGA 250 MG tablet, TAKE 4 TABLETS BY MOUTH ONCE DAILY AS DIRECTED.  TAKE 1 HOUR BEFORE OR 2 HOURS AFTER A MEAL, Disp: 120 tablet, Rfl: 11 .  diltiazem (CARDIZEM SR) 120 MG 12 hr capsule, Take  120 mg by mouth daily. , Disp: , Rfl:  .  fluticasone (FLONASE) 50 MCG/ACT nasal spray, Place into the nose., Disp: , Rfl:  .  predniSONE (DELTASONE) 5 MG tablet, TAKE 1 TABLET BY MOUTH ONCE DAILY WITH BREAKFAST (Patient not taking: Reported on 03/10/2018), Disp: 30 tablet, Rfl: 3  Physical exam:  Vitals:   03/10/18 1121  BP: (!) 146/87  Pulse: 70  Resp: 18  Temp: (!) 97.5 F (36.4 C)  TempSrc: Tympanic  SpO2: 98%  Weight: 203 lb 1.6 oz (92.1 kg)  Height: 5\' 10"  (1.778 m)   Physical Exam Constitutional:      General: He is not in acute distress. HENT:     Head: Normocephalic and atraumatic.  Eyes:     Pupils: Pupils are equal, round, and reactive to light.  Neck:     Musculoskeletal: Normal range of motion.  Cardiovascular:     Rate and Rhythm: Normal rate and regular rhythm.     Heart sounds: Normal heart sounds.  Pulmonary:     Effort: Pulmonary effort is normal.     Breath sounds: Normal breath sounds.  Abdominal:     General: Bowel sounds are normal.     Palpations: Abdomen is soft.  Musculoskeletal:        General: Swelling (trace b/l) present.  Skin:    General: Skin is warm and dry.  Neurological:     Mental Status: He is alert and oriented to person, place, and time.      CMP Latest Ref Rng & Units 03/10/2018  Glucose 70 - 99 mg/dL 123(H)  BUN 8 - 23 mg/dL 19  Creatinine 0.61 - 1.24 mg/dL 1.07  Sodium 135 - 145 mmol/L 139  Potassium 3.5 - 5.1 mmol/L 3.8  Chloride 98 - 111 mmol/L 104  CO2 22 - 32 mmol/L 30  Calcium 8.9 - 10.3 mg/dL 11.2(H)  Total Protein 6.5 - 8.1 g/dL 6.6  Total Bilirubin 0.3 - 1.2 mg/dL 0.9  Alkaline Phos 38 - 126 U/L 63  AST 15 - 41 U/L 16  ALT 0 - 44 U/L 12   CBC Latest Ref Rng & Units 03/10/2018  WBC 4.0 - 10.5 K/uL 6.2  Hemoglobin 13.0 - 17.0 g/dL 11.8(L)  Hematocrit 39.0 - 52.0 % 37.0(L)  Platelets 150 - 400 K/uL 221    Assessment and plan- Patient is a 83 y.o. male with metastatic castrate  sensitive prostate cancer with  metastases to the bones.  He is currently on Lupron and Zytiga  Patient was supposed to receive Lupron in October 2019 but he did not show up for his appointment.  We are in the process of re-obtaining his insurance authorization followed by which he will get Lupron ASAP and get it every 6 months.  Patient will continue Zytiga and prednisone at this time.  Overall he is tolerating it well.  PSA continues to trend down and is down to 0.06 today.  I will see him back in 3 months time with CBC CMP and PSA.  It has been about a year since we did his prior scans and I will therefore obtain a CT chest abdomen and pelvis with contrast and a bone scan prior  Patient is also on Zometa for his bone metastases and will get it along with Zytiga when he comes later this week.   Visit Diagnosis 1. Prostate cancer metastatic to bone ([place])   2. Long term (current) use of bisphosphonates   3. High risk medication use      Dr. Randa Evens, MD, MPH [facility] [phone] 03/12/2018 8:19 AM

## 2018-03-17 ENCOUNTER — Other Ambulatory Visit: Payer: Self-pay | Admitting: Oncology

## 2018-03-17 ENCOUNTER — Inpatient Hospital Stay: Payer: Medicare Other

## 2018-03-17 VITALS — BP 103/68 | HR 89 | Temp 96.2°F | Resp 20

## 2018-03-17 DIAGNOSIS — Z5111 Encounter for antineoplastic chemotherapy: Secondary | ICD-10-CM | POA: Diagnosis not present

## 2018-03-17 DIAGNOSIS — C61 Malignant neoplasm of prostate: Secondary | ICD-10-CM

## 2018-03-17 DIAGNOSIS — C7951 Secondary malignant neoplasm of bone: Principal | ICD-10-CM

## 2018-03-17 MED ORDER — SODIUM CHLORIDE 0.9 % IV SOLN
Freq: Once | INTRAVENOUS | Status: AC
  Administered 2018-03-17: 14:00:00 via INTRAVENOUS
  Filled 2018-03-17: qty 250

## 2018-03-17 MED ORDER — ZOLEDRONIC ACID 4 MG/100ML IV SOLN
4.0000 mg | INTRAVENOUS | Status: DC
  Administered 2018-03-17: 4 mg via INTRAVENOUS
  Filled 2018-03-17: qty 100

## 2018-03-17 MED ORDER — LEUPROLIDE ACETATE (6 MONTH) 45 MG IM KIT
45.0000 mg | PACK | INTRAMUSCULAR | Status: DC
  Administered 2018-03-17: 45 mg via INTRAMUSCULAR
  Filled 2018-03-17: qty 45

## 2018-03-19 ENCOUNTER — Telehealth: Payer: Self-pay | Admitting: Pharmacy Technician

## 2018-03-19 NOTE — Telephone Encounter (Signed)
Oral Oncology Patient Advocate Encounter  Received notification from [employer] that prior authorization for Zytiga (Abiraterone) is required.  PA submitted on CoverMyMeds Key [id number] Status is pending  Oral Oncology Clinic will continue to follow.  [place] Patient [place] Phone [phone] Fax [phone] 03/19/2018 3:21 PM

## 2018-03-19 NOTE — Telephone Encounter (Signed)
Oral Oncology Patient Advocate Encounter  Prior Authorization for Zytiga (Abiraterone) has been approved.    PA# [id number]  Effective dates: 03/19/18 through 04/01/19  Patients co-pay is $1499.67.  Patient has 2 grants that will help reduce his out of pocket costs.  Oral Oncology Clinic will continue to follow.   [place] Patient [place] Phone [phone] Fax [phone] 03/19/2018 3:28 PM

## 2018-03-20 ENCOUNTER — Telehealth: Payer: Self-pay | Admitting: Pharmacist

## 2018-03-20 DIAGNOSIS — C7951 Secondary malignant neoplasm of bone: Principal | ICD-10-CM

## 2018-03-20 DIAGNOSIS — C61 Malignant neoplasm of prostate: Secondary | ICD-10-CM

## 2018-03-20 MED ORDER — ABIRATERONE ACETATE 250 MG PO TABS: 1000.0000 mg | ORAL_TABLET | Freq: Every day | ORAL | 11 refills | Status: DC

## 2018-03-20 NOTE — Telephone Encounter (Signed)
Zytiga scheduled to be mailed 12/30 for delivery 12/31 from [facility].

## 2018-03-20 NOTE — Telephone Encounter (Signed)
Oral Chemotherapy Pharmacist Encounter   Spoke with Mr. Havard to review dosing, administration, and side effects for Zytiga (abiraterone). He reported no new side effects or concerns since he was last seen in the office.   Darl Pikes, PharmD, BCPS, [facility] Hematology/Oncology Clinical Pharmacist [facility] Oral [facility] [phone]  03/20/2018 11:21 AM

## 2018-03-20 NOTE — Telephone Encounter (Signed)
Oral Oncology Pharmacist Encounter  Patient is switching from manufacturer assistance to filling at [facility]. Refill prescription for Zytiga sent to [facility] for the treatment of metastatic castrate sensitive prostate cancer in conjunction with prednisone and Lupron, planned duration until disease progression or unacceptable drug toxicity. Medication started 01/2017.  BP from 03/10/18 elevated, continue to monitor BP for the need to optimize BP management. CMP from 03/10/18 assessed, no relevant lab abnormalities. Prescription dose and frequency assessed.   Current medication list in Epic reviewed, a few DDIs with Zytiga identified: - Zytiga may increase the serum concentration of carvedilol and metoprolol. No baseline adjustment needed considering patient's recent documented HTN and his HR is wnl. This is an existing DDI for the patient and he appears to not have any issues related to this interaction.  Prescription has been e-scribed to the [facility] and we will see if the insurance company will let it be filled there.  Oral Oncology Clinic will continue to follow patient.   Darl Pikes, PharmD, BCPS, [facility] Hematology/Oncology Clinical Pharmacist [facility] Oral [facility] [phone]  03/20/2018 10:57 AM

## 2018-03-30 MED FILL — ABIRATERONE ACETATE 250 MG: 250 | 30 days supply | Qty: 120 | Fill #0

## 2018-04-30 MED FILL — ABIRATERONE ACETATE 250 MG: 250 | 30 days supply | Qty: 120 | Fill #1

## 2018-05-28 MED FILL — ABIRATERONE ACETATE 250 MG: 250 | 30 days supply | Qty: 120 | Fill #2

## 2018-06-04 ENCOUNTER — Encounter
Admission: RE | Admit: 2018-06-04 | Discharge: 2018-06-04 | Disposition: A | Discharge status: Home / Self Care | Payer: Medicare Other | Source: Ambulatory Visit | Attending: Oncology | Admitting: Oncology

## 2018-06-04 ENCOUNTER — Ambulatory Visit
Admission: RE | Admit: 2018-06-04 | Discharge: 2018-06-04 | Disposition: A | Discharge status: Home / Self Care | Payer: Medicare Other | Source: Ambulatory Visit | Attending: Oncology | Admitting: Oncology

## 2018-06-04 DIAGNOSIS — C7951 Secondary malignant neoplasm of bone: Secondary | ICD-10-CM | POA: Insufficient documentation

## 2018-06-04 DIAGNOSIS — C61 Malignant neoplasm of prostate: Secondary | ICD-10-CM

## 2018-06-04 LAB — POCT I-STAT CREATININE: Creatinine, Ser: 1.1 mg/dL (ref 0.61–1.24)

## 2018-06-04 MED ORDER — TECHNETIUM TC 99M MEDRONATE IV KIT
20.0000 | PACK | Freq: Once | INTRAVENOUS | Status: AC | PRN
  Administered 2018-06-04: 21.94 via INTRAVENOUS

## 2018-06-04 MED ORDER — IOHEXOL 300 MG/ML  SOLN
100.0000 mL | Freq: Once | INTRAMUSCULAR | Status: AC | PRN
  Administered 2018-06-04: 100 mL via INTRAVENOUS

## 2018-06-09 ENCOUNTER — Inpatient Hospital Stay: Payer: Medicare Other

## 2018-06-09 ENCOUNTER — Other Ambulatory Visit: Payer: Self-pay

## 2018-06-09 ENCOUNTER — Inpatient Hospital Stay: Payer: Medicare Other | Attending: Oncology | Admitting: Oncology

## 2018-06-09 VITALS — BP 154/82 | HR 74 | Temp 97.4°F | Resp 18 | Wt 204.5 lb

## 2018-06-09 DIAGNOSIS — C61 Malignant neoplasm of prostate: Secondary | ICD-10-CM

## 2018-06-09 DIAGNOSIS — Z79899 Other long term (current) drug therapy: Secondary | ICD-10-CM | POA: Diagnosis not present

## 2018-06-09 DIAGNOSIS — E213 Hyperparathyroidism, unspecified: Secondary | ICD-10-CM | POA: Diagnosis not present

## 2018-06-09 DIAGNOSIS — R5382 Chronic fatigue, unspecified: Secondary | ICD-10-CM | POA: Insufficient documentation

## 2018-06-09 DIAGNOSIS — C7951 Secondary malignant neoplasm of bone: Secondary | ICD-10-CM | POA: Diagnosis not present

## 2018-06-09 LAB — COMPREHENSIVE METABOLIC PANEL
ALT: 12 U/L (ref 0–44)
ANION GAP: 4 — AB (ref 5–15)
AST: 16 U/L (ref 15–41)
Albumin: 4.1 g/dL (ref 3.5–5.0)
Alkaline Phosphatase: 62 U/L (ref 38–126)
BUN: 20 mg/dL (ref 8–23)
CO2: 28 mmol/L (ref 22–32)
Calcium: 11.4 mg/dL — ABNORMAL HIGH (ref 8.9–10.3)
Chloride: 108 mmol/L (ref 98–111)
Creatinine, Ser: 1 mg/dL (ref 0.61–1.24)
GFR calc Af Amer: >60 mL/min (ref >60)
GFR calc non Af Amer: >60 mL/min (ref >60)
Glucose, Bld: 158 mg/dL — ABNORMAL HIGH (ref 70–99)
POTASSIUM: 3.9 mmol/L (ref 3.5–5.1)
Sodium: 140 mmol/L (ref 135–145)
Total Bilirubin: 0.7 mg/dL (ref 0.3–1.2)
Total Protein: 6.8 g/dL (ref 6.5–8.1)

## 2018-06-09 LAB — PSA: Prostatic Specific Antigen: 0.05 ng/mL (ref 0.00–4.00)

## 2018-06-09 LAB — CBC WITH DIFFERENTIAL/PLATELET
Abs Immature Granulocytes: 0.03 10*3/uL (ref 0.00–0.07)
Basophils Absolute: 0 10*3/uL (ref 0.0–0.1)
Basophils Relative: 0 %
Eosinophils Absolute: 0.1 10*3/uL (ref 0.0–0.5)
Eosinophils Relative: 1 %
HCT: 35.8 % — ABNORMAL LOW (ref 39.0–52.0)
Hemoglobin: 11.8 g/dL — ABNORMAL LOW (ref 13.0–17.0)
Immature Granulocytes: 0 %
Lymphocytes Relative: 18 %
Lymphs Abs: 1.2 10*3/uL (ref 0.7–4.0)
MCH: 28.9 pg (ref 26.0–34.0)
MCHC: 33 g/dL (ref 30.0–36.0)
MCV: 87.5 fL (ref 80.0–100.0)
Monocytes Absolute: 0.3 10*3/uL (ref 0.1–1.0)
Monocytes Relative: 5 %
NEUTROS ABS: 5.3 10*3/uL (ref 1.7–7.7)
Neutrophils Relative %: 76 %
PLATELETS: 227 10*3/uL (ref 150–400)
RBC: 4.09 MIL/uL — ABNORMAL LOW (ref 4.22–5.81)
RDW: 14.1 % (ref 11.5–15.5)
WBC: 6.9 10*3/uL (ref 4.0–10.5)
nRBC: 0 % (ref 0.0–0.2)

## 2018-06-09 NOTE — Progress Notes (Signed)
Here for follow up. Per pt " I feel alright " in general

## 2018-06-10 ENCOUNTER — Telehealth: Payer: Self-pay | Admitting: Pharmacy Technician

## 2018-06-11 NOTE — Telephone Encounter (Signed)
Oral Oncology Patient Advocate Encounter   Was successful in securing patient a $3500 grant from [employer] (PAF) to provide copayment coverage for Zytiga.  This will keep the out of pocket expense at $0.     I have spoken with the patient.    The billing information is as follows and has been shared with [place].   RxBin: [id number] PCN:  [id number] Member ID: [id number] Group ID: [id number] Dates of Eligibility: 06/10/2018 through 06/10/2019  [place] Patient [place] Phone [phone] Fax [phone] 06/11/2018 12:50 PM

## 2018-06-12 ENCOUNTER — Encounter: Payer: Self-pay | Admitting: Oncology

## 2018-06-12 NOTE — Progress Notes (Signed)
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 He also has a past history of prostate cancer about 15 years ago(Gleasons score 8)and was treated with prostatectomy at that time. He did not receive any radiation treatment and does not remember if his PSA was subsequently monitored. PSA in March 2015 was 7.9 and in 2014 was 6.7 after prostatectomy. He was last seen by medical oncology Dr. Everlene Other in 2015 and at that time his PSA was 9.2 previously was supposed to follow-up after 2 months and ADT was supposed to be started at that time the patient was lost to follow-up.  He was seen by Dr. Golden Pop and his primary care doctor he saw until about 2 years ago. He has had ongoing chronic back pain and has had back surgery in the past but was having worsening back pain over the last few months and was seen by Dr. Raul Del.   MRI of the lumbar spine on 11/22/2016 revealed extensive osseous metastatic disease with pathologic compression fracture of the L3 vertebral body. Posteriorly displace fragments and metastatic soft tissue result in severe canal stenosis and compression of the thecal sac at this level.  CT chest abdomen and pelvis on 11/25/2016 did not reveal any evidence of metastatic disease other than his bones  Patient had decompression  surgery of his L3vertebral body which showed:Metastatic adenocarcinoma with features consistent with primary prostatic origin. See note.  Note: The patient history of previously diagnosed prostate carcinoma is noted. Immunoperoxidase stains were performed on formalin-fixed (non-decalcified) paraffin-embedded tissue in block A3 with appropriate positive controls. The tumor cells stain with CK8/18, PROSTATIC SPECIFIC ANTIGEN, PROSTATIC ACID PHOSPHATASE and P501S. The tumor cells do not stain with CYTOKERATIN 7, CYTOKERATIN 20, CK5/6, P40, GATA-3, CDX-2 or THYROID TRANSCRIPTION FACTOR (TTF-1). These findings are in support of the interpretation of metastatic adenocarcinoma with features consistent with primary prostatic origin.  Patient also has primary hyperparathyroidism and hypercalcemia secondary to it for which he sees Dr[facility]Akhter from [facility] and has been referred to surgery for the same  Abiraterone was started for high risk castrate sensitive prostate cancer on 02/14/2017.Baseline bone density showed osteopenia with a T score of -1.9 at the right femur neck.  Interval history-overall he is tolerating Zytiga well and continues to be compliant with it along with prednisone.  He has chronic fatigue but denies any new pain.  His appetite is good and denies any unintentional weight loss  ECOG PS- 1 Pain scale- 0 Opioid associated constipation- no  Review of systems- Review of Systems  Constitutional: Positive for malaise/fatigue. Negative for chills, fever and weight loss.  HENT: Negative for congestion, ear discharge and nosebleeds.   Eyes: Negative for blurred vision.  Respiratory: Negative for cough, hemoptysis, sputum production, shortness of breath and wheezing.   Cardiovascular: Negative for chest pain, palpitations, orthopnea and claudication.  Gastrointestinal: Negative for abdominal pain, blood in stool, constipation, diarrhea, heartburn, melena, nausea  and vomiting.  Genitourinary:  Negative for dysuria, flank pain, frequency, hematuria and urgency.  Musculoskeletal: Negative for back pain, joint pain and myalgias.  Skin: Negative for rash.  Neurological: Negative for dizziness, tingling, focal weakness, seizures, weakness and headaches.  Endo/Heme/Allergies: Does not bruise/bleed easily.  Psychiatric/Behavioral: Negative for depression and suicidal ideas. The patient does not have insomnia.       No Known Allergies   Past Medical History:  Diagnosis Date   Arthritis    Cancer ([place])    Prostate CA approximately 11 years ago   Hyperlipidemia    Hypertension    Myocardial infarction [facility])      Past Surgical History:  Procedure Laterality Date   APPENDECTOMY     BACK SURGERY     CARDIAC CATHETERIZATION     stent x 1   CHOLECYSTECTOMY     LUMBAR LAMINECTOMY/DECOMPRESSION MICRODISCECTOMY Left 08/20/2012   Procedure: LUMBAR LAMINECTOMY/DECOMPRESSION MICRODISCECTOMY 1 LEVEL;  Surgeon: Faythe Ghee, MD;  Location: [place];  Service: Neurosurgery;  Laterality: Left;  lumbar four-five   PROSTATE SURGERY     TONSILLECTOMY      Social History   Socioeconomic History   Marital status: Married    Spouse name: Not on file   Number of children: Not on file   Years of education: Not on file   Highest education level: Not on file  Occupational History   Not on file  Social Needs   Financial resource strain: Not on file   Food insecurity:    Worry: Not on file    Inability: Not on file   Transportation needs:    Medical: Not on file    Non-medical: Not on file  Tobacco Use   Smoking status: Never Smoker   Smokeless tobacco: Never Used  Substance and Sexual Activity   Alcohol use: No   Drug use: No   Sexual activity: Not on file  Lifestyle   Physical activity:    Days per week: Not on file    Minutes per session: Not on file   Stress: Not on file  Relationships   Social connections:    Talks on phone: Not on file     Gets together: Not on file    Attends religious service: Not on file    Active member of club or organization: Not on file    Attends meetings of clubs or organizations: Not on file    Relationship status: Not on file   Intimate partner violence:    Fear of current or ex partner: Not on file    Emotionally abused: Not on file    Physically abused: Not on file    Forced sexual activity: Not on file  Other Topics Concern   Not on file  Social History Narrative   Not on file    Family History  Problem Relation Age of Onset   Stroke Mother    Heart attack Father      Current Outpatient Medications:    abiraterone acetate (ZYTIGA) 250 MG tablet, Take 4 tablets (1,000 mg total) by mouth daily. Take on an empty stomach 1 hour before or 2 hours after a meal, Disp: 120 tablet, Rfl: 11   aspirin EC 81 MG tablet, Take 81 mg by mouth daily., Disp: , Rfl:    atorvastatin (LIPITOR) 10 MG tablet, Take 10 mg by mouth daily., Disp: , Rfl:    carvedilol (COREG) 25 MG tablet, Take 25 mg by mouth 2 (two)  times daily., Disp: , Rfl:    cetirizine (ZYRTEC) 10 MG tablet, Take 10 mg by mouth daily., Disp: , Rfl:    cholecalciferol (VITAMIN D) 1000 units tablet, Take 2,000 Units by mouth daily., Disp: , Rfl:    clopidogrel (PLAVIX) 75 MG tablet, Take 75 mg by mouth daily., Disp: , Rfl:    diltiazem (CARDIZEM CD) 120 MG 24 hr capsule, Take by mouth., Disp: , Rfl:    diltiazem (CARDIZEM SR) 120 MG 12 hr capsule, Take 120 mg by mouth daily. , Disp: , Rfl:    furosemide (LASIX) 20 MG tablet, Take 20 mg by mouth daily., Disp: , Rfl: 11   hydrALAZINE (APRESOLINE) 25 MG tablet, Take 25 mg by mouth 3 (three) times daily., Disp: , Rfl:    lisinopril (PRINIVIL,ZESTRIL) 40 MG tablet, Take 40 mg by mouth daily., Disp: , Rfl: 3   metoprolol tartrate (LOPRESSOR) 25 MG tablet, Take 50 mg by mouth 2 (two) times daily. , Disp: , Rfl:    predniSONE (DELTASONE) 5 MG tablet, TAKE 1 TABLET BY MOUTH ONCE  DAILY WITH BREAKFAST, Disp: 30 tablet, Rfl: 3   carbamide peroxide (DEBROX) 6.5 % OTIC solution, Place in ear(s)., Disp: , Rfl:    nitroGLYCERIN (NITROSTAT) 0.4 MG SL tablet, As directed, Disp: , Rfl:   Physical exam:  Vitals:   06/09/18 1406  BP: (!) 154/82  Pulse: 74  Resp: 18  Temp: (!) 97.4 F (36.3 C)  TempSrc: Tympanic  Weight: 204 lb 8 oz (92.8 kg)   Physical Exam Constitutional:      General: He is not in acute distress. HENT:     Head: Normocephalic and atraumatic.  Eyes:     Pupils: Pupils are equal, round, and reactive to light.  Neck:     Musculoskeletal: Normal range of motion.  Cardiovascular:     Rate and Rhythm: Normal rate and regular rhythm.     Heart sounds: Normal heart sounds.  Pulmonary:     Effort: Pulmonary effort is normal.     Breath sounds: Normal breath sounds.  Abdominal:     General: Bowel sounds are normal.     Palpations: Abdomen is soft.  Musculoskeletal:     Comments: Trace bilateral edema  Skin:    General: Skin is warm and dry.  Neurological:     Mental Status: He is alert and oriented to person, place, and time.      CMP Latest Ref Rng & Units 06/09/2018  Glucose 70 - 99 mg/dL 158(H)  BUN 8 - 23 mg/dL 20  Creatinine 0.61 - 1.24 mg/dL 1.00  Sodium 135 - 145 mmol/L 140  Potassium 3.5 - 5.1 mmol/L 3.9  Chloride 98 - 111 mmol/L 108  CO2 22 - 32 mmol/L 28  Calcium 8.9 - 10.3 mg/dL 11.4(H)  Total Protein 6.5 - 8.1 g/dL 6.8  Total Bilirubin 0.3 - 1.2 mg/dL 0.7  Alkaline Phos 38 - 126 U/L 62  AST 15 - 41 U/L 16  ALT 0 - 44 U/L 12   CBC Latest Ref Rng & Units 06/09/2018  WBC 4.0 - 10.5 K/uL 6.9  Hemoglobin 13.0 - 17.0 g/dL 11.8(L)  Hematocrit 39.0 - 52.0 % 35.8(L)  Platelets 150 - 400 K/uL 227    No images are attached to the encounter.  Ct Chest W Contrast  Result Date: 06/04/2018 CLINICAL DATA:  History of prostate cancer now with osseous metastatic disease. Follow-up exam. EXAM: CT CHEST, ABDOMEN, AND PELVIS WITH  CONTRAST TECHNIQUE:  Multidetector CT imaging of the chest, abdomen and pelvis was performed following the standard protocol during bolus administration of intravenous contrast. CONTRAST:  167mL OMNIPAQUE IOHEXOL 300 MG/ML  SOLN COMPARISON:  Bone scan 06/12/2017; CT abdomen pelvis 06/21/2013 FINDINGS: CT CHEST FINDINGS Cardiovascular: Normal heart size. No pericardial effusion. Coronary arterial vascular calcifications. Thoracic aortic vascular calcifications. Trace fluid superior pericardial recess. Mediastinum/Nodes: No enlarged axillary, mediastinal or hilar lymphadenopathy. Normal appearance of the esophagus. Lungs/Pleura: Central airways are patent. Dependent atelectasis within the right lower lobe. Similar findings within the posterior left lower lobe suggestive of impacted bronchus (image 50; series 2). No pleural effusion or pneumothorax. Musculoskeletal: Extensive sclerotic foci are demonstrated throughout the thoracic spine, ribs, sternum and scapulas, compatible with diffuse osseous metastatic disease. CT ABDOMEN PELVIS FINDINGS Hepatobiliary: The liver is normal in size and contour. Multiple subcentimeter low-attenuation lesions are demonstrated within the left and right hepatic lobes, unchanged dating back to 2015, most compatible benign process. Additionally there is an enhancing lesion within the left hepatic lobe adjacent to the falciform ligament (image 61; series 2), unchanged from prior, potentially representing flash filling hemangioma. Gallbladder surgically absent. No intrahepatic or extrahepatic biliary ductal dilatation. Pancreas: Unremarkable Spleen: Unremarkable Adrenals/Urinary Tract: Normal adrenal glands. Kidneys enhance symmetrically with contrast. Bilateral renal cysts. No hydronephrosis. Urinary bladder is unremarkable. Stomach/Bowel: Sigmoid colonic diverticulosis. No CT evidence for acute diverticulitis. No evidence for bowel obstruction. No free fluid or free intraperitoneal air.  Normal morphology of the stomach. Descending duodenum diverticulum. Vascular/Lymphatic: Normal caliber abdominal aorta. Retroaortic left renal vein. Peripheral calcified noncalcified atherosclerotic plaque follow-up in the abdominal aorta. Multiple surgical clips within the pelvis. Reproductive: Status post prostatectomy. Other: None. Musculoskeletal: Extensive sclerotic osseous metastasis throughout the lumbar spine and pelvis measuring up to 3.1 x 1.5 cm involving the left ilium (image 99; series 2). Lumbar spinal fusion hardware. IMPRESSION: 1. Findings compatible with extensive sclerotic osseous metastatic disease. No evidence for hepatic, nodal or pulmonary metastatic disease. Electronically Signed   By: Lovey Newcomer M.D.   On: 06/04/2018 11:30   Nm Bone Scan Whole Body  Result Date: 06/04/2018 CLINICAL DATA:  Prostate cancer metastatic to bone follow-up. EXAM: NUCLEAR MEDICINE WHOLE BODY BONE SCAN TECHNIQUE: Whole body anterior and posterior images were obtained approximately 3 hours after intravenous injection of radiopharmaceutical. RADIOPHARMACEUTICALS:  21.94 mCi Technetium-8m MDP IV COMPARISON:  06/12/2017 FINDINGS: Stable distribution of multiple foci of abnormal radiotracer uptake, consistent with skeletal metastatic disease, including the right humerus, right sternum, multiple foci along the thoracolumbar spine, left and right posterior ribs, sacrum, left medial acetabulum, left ilium and right ischium. Older similar in distribution, the foci of abnormal activity are less intense. No convincing new skeletal metastatic lesions identified. IMPRESSION: Multiple foci of skeletal metastatic disease, which appear similar in distribution, but decreased in intensity when compared to the most recent prior bone scan. Electronically Signed   By: Fidela Salisbury M.D.   On: 06/04/2018 16:52   Ct Abdomen Pelvis W Contrast  Result Date: 06/04/2018 CLINICAL DATA:  History of prostate cancer now with osseous  metastatic disease. Follow-up exam. EXAM: CT CHEST, ABDOMEN, AND PELVIS WITH CONTRAST TECHNIQUE: Multidetector CT imaging of the chest, abdomen and pelvis was performed following the standard protocol during bolus administration of intravenous contrast. CONTRAST:  164mL OMNIPAQUE IOHEXOL 300 MG/ML  SOLN COMPARISON:  Bone scan 06/12/2017; CT abdomen pelvis 06/21/2013 FINDINGS: CT CHEST FINDINGS Cardiovascular: Normal heart size. No pericardial effusion. Coronary arterial vascular calcifications. Thoracic aortic vascular calcifications. Trace fluid superior pericardial  recess. Mediastinum/Nodes: No enlarged axillary, mediastinal or hilar lymphadenopathy. Normal appearance of the esophagus. Lungs/Pleura: Central airways are patent. Dependent atelectasis within the right lower lobe. Similar findings within the posterior left lower lobe suggestive of impacted bronchus (image 50; series 2). No pleural effusion or pneumothorax. Musculoskeletal: Extensive sclerotic foci are demonstrated throughout the thoracic spine, ribs, sternum and scapulas, compatible with diffuse osseous metastatic disease. CT ABDOMEN PELVIS FINDINGS Hepatobiliary: The liver is normal in size and contour. Multiple subcentimeter low-attenuation lesions are demonstrated within the left and right hepatic lobes, unchanged dating back to 2015, most compatible benign process. Additionally there is an enhancing lesion within the left hepatic lobe adjacent to the falciform ligament (image 61; series 2), unchanged from prior, potentially representing flash filling hemangioma. Gallbladder surgically absent. No intrahepatic or extrahepatic biliary ductal dilatation. Pancreas: Unremarkable Spleen: Unremarkable Adrenals/Urinary Tract: Normal adrenal glands. Kidneys enhance symmetrically with contrast. Bilateral renal cysts. No hydronephrosis. Urinary bladder is unremarkable. Stomach/Bowel: Sigmoid colonic diverticulosis. No CT evidence for acute diverticulitis. No  evidence for bowel obstruction. No free fluid or free intraperitoneal air. Normal morphology of the stomach. Descending duodenum diverticulum. Vascular/Lymphatic: Normal caliber abdominal aorta. Retroaortic left renal vein. Peripheral calcified noncalcified atherosclerotic plaque follow-up in the abdominal aorta. Multiple surgical clips within the pelvis. Reproductive: Status post prostatectomy. Other: None. Musculoskeletal: Extensive sclerotic osseous metastasis throughout the lumbar spine and pelvis measuring up to 3.1 x 1.5 cm involving the left ilium (image 99; series 2). Lumbar spinal fusion hardware. IMPRESSION: 1. Findings compatible with extensive sclerotic osseous metastatic disease. No evidence for hepatic, nodal or pulmonary metastatic disease. Electronically Signed   By: Lovey Newcomer M.D.   On: 06/04/2018 11:30     Assessment and plan- Patient is a 84 y.o. male with metastatic castrate sensitive prostate cancer with metastases to the bones.  He is currently on Lupron and Zytiga.  He is here to discuss CT scan results and routine follow-up of prostate cancer  I discussed the results of the CT chest abdomen and pelvis as well as bone scan with the patient in detail which shows overall that his bony metastatic disease is stable.  His PSA continues to trend down on Zytiga and is now down to 0.06.  He last received his Lupron 6 months shot in December 2019 and would be due for the next shot in June 2020.  He will continue to take his Zytiga and prednisone at this time.  Patient has chronic hypercalcemia dating back to 2014 and his calcium levels typically range between 10.5-11.5 and wax and wane between that range.  This is not related to his prostate cancer.  He has seen endocrinology in the past but stopped going to Dr. Paticia Stack as he felt that her management was not changing and he had a significant co-pay with each visit.  Today his calcium is 11.4.  We will inform Dr. Doy Hutching about this.  Patient  has been getting Zometa every 6 months not for his hypercalcemia or for his prostate cancer but for his osteopenia which can potentially worsen while he is on ADT.  He will get his next Zometa in 3 months time and I will see him at that time with CBC CMP and PSA      Visit Diagnosis 1. Prostate cancer metastatic to bone ([place])   2. High risk medication use   3. Hypercalcemia      Dr. Randa Evens, MD, MPH [facility] [phone] 06/12/2018 8:58 AM

## 2018-06-15 ENCOUNTER — Telehealth: Payer: Self-pay | Admitting: Pharmacist

## 2018-06-15 NOTE — Telephone Encounter (Signed)
Oral Chemotherapy Pharmacist Encounter  Follow-Up Form  Called patient today to follow up regarding patient's oral chemotherapy medication: Zytiga (abiraterone)  Original Start date of oral chemotherapy: 01/2017  Pt reports 0 tablets/doses of Zytiga missed so far this month.   Pt reports the following side effects: None reported  Recent labs reviewed: PSA from 06/09/2018  New medications?: None reported  Other Issues: None reported  Patient knows to call the office with questions or concerns. Oral Oncology Clinic will continue to follow.  Darl Pikes, PharmD, BCPS, [facility] Hematology/Oncology Clinical Pharmacist [facility]/HP/AP Oral [place] [facility] [phone]  06/15/2018 12:19 PM

## 2018-06-27 MED FILL — ABIRATERONE ACETATE 250 MG: 250 | 30 days supply | Qty: 120 | Fill #3

## 2018-07-06 ENCOUNTER — Other Ambulatory Visit: Payer: Self-pay | Admitting: Oncology

## 2018-07-27 MED FILL — ABIRATERONE ACETATE 250 MG: 250 | 30 days supply | Qty: 120 | Fill #4

## 2018-08-31 MED FILL — ABIRATERONE ACETATE 250 MG: 250 | 30 days supply | Qty: 120 | Fill #5

## 2018-09-14 ENCOUNTER — Telehealth: Payer: Self-pay | Admitting: *Deleted

## 2018-09-14 NOTE — Telephone Encounter (Signed)
Patient's son and caregiver has tested positive for COVD and patient has an appointment. Asking if that needs to be rescheduled until his test results come back or keep it. Please return call to patient to let him know how to proceed. His appointment is tomorrow

## 2018-09-14 NOTE — Telephone Encounter (Signed)
The son who lives with him and takes care of him had a birthday party about 6 days ago and had sore throat. He was tested for covid. The daughter and wife got tested yest at [facility] clinic, he got tested at acute care off of [address] yest 12noon. He has no sx and neither does any of the other people in family. They are wearing masks and staying at least 6 feet apart. The son who is the only tested positive at present date is staying in his room and has his own bathroom. They put his food on tray and then the leave and he opens door and gets food and then when done he puts it back on tray for other members to get it. I told him that I will need to check with Janese Banks since he was tested but because sx can come on up to 14 days after exposure . We may test him before he comes in in 20month. We will change his appt to 1 month from now. He is agreeable to the plan and will call me when he gets all results of family members

## 2018-09-15 ENCOUNTER — Inpatient Hospital Stay: Payer: Medicare Other

## 2018-09-15 ENCOUNTER — Inpatient Hospital Stay: Payer: Medicare Other | Admitting: Oncology

## 2018-09-17 ENCOUNTER — Ambulatory Visit: Payer: Medicare Other

## 2018-09-17 ENCOUNTER — Other Ambulatory Visit: Payer: Medicare Other

## 2018-09-17 ENCOUNTER — Ambulatory Visit: Payer: Medicare Other | Admitting: Oncology

## 2018-09-30 MED FILL — ABIRATERONE ACETATE 250 MG: 250 | 30 days supply | Qty: 120 | Fill #6

## 2018-10-05 ENCOUNTER — Other Ambulatory Visit: Payer: Self-pay | Admitting: Oncology

## 2018-10-15 ENCOUNTER — Other Ambulatory Visit: Payer: Self-pay | Admitting: *Deleted

## 2018-10-15 DIAGNOSIS — C7951 Secondary malignant neoplasm of bone: Secondary | ICD-10-CM

## 2018-10-15 DIAGNOSIS — C61 Malignant neoplasm of prostate: Secondary | ICD-10-CM

## 2018-10-16 ENCOUNTER — Inpatient Hospital Stay: Payer: Medicare Other

## 2018-10-16 ENCOUNTER — Inpatient Hospital Stay (HOSPITAL_BASED_OUTPATIENT_CLINIC_OR_DEPARTMENT_OTHER): Payer: Medicare Other | Admitting: Oncology

## 2018-10-16 ENCOUNTER — Inpatient Hospital Stay: Payer: Medicare Other | Attending: Oncology

## 2018-10-16 ENCOUNTER — Other Ambulatory Visit: Payer: Self-pay

## 2018-10-16 VITALS — BP 142/90 | Temp 97.8°F | Resp 16 | Wt 193.2 lb

## 2018-10-16 DIAGNOSIS — C7951 Secondary malignant neoplasm of bone: Secondary | ICD-10-CM

## 2018-10-16 DIAGNOSIS — Z5111 Encounter for antineoplastic chemotherapy: Secondary | ICD-10-CM | POA: Insufficient documentation

## 2018-10-16 DIAGNOSIS — Z79899 Other long term (current) drug therapy: Secondary | ICD-10-CM

## 2018-10-16 DIAGNOSIS — M8588 Other specified disorders of bone density and structure, other site: Secondary | ICD-10-CM | POA: Insufficient documentation

## 2018-10-16 DIAGNOSIS — C61 Malignant neoplasm of prostate: Secondary | ICD-10-CM

## 2018-10-16 DIAGNOSIS — N179 Acute kidney failure, unspecified: Secondary | ICD-10-CM | POA: Diagnosis not present

## 2018-10-16 LAB — COMPREHENSIVE METABOLIC PANEL
ALT: 12 U/L (ref 0–44)
AST: 14 U/L — ABNORMAL LOW (ref 15–41)
Albumin: 4.1 g/dL (ref 3.5–5.0)
Alkaline Phosphatase: 70 U/L (ref 38–126)
Anion gap: 9 (ref 5–15)
BUN: 25 mg/dL — ABNORMAL HIGH (ref 8–23)
CO2: 27 mmol/L (ref 22–32)
Calcium: 11 mg/dL — ABNORMAL HIGH (ref 8.9–10.3)
Chloride: 106 mmol/L (ref 98–111)
Creatinine, Ser: 1.32 mg/dL — ABNORMAL HIGH (ref 0.61–1.24)
GFR calc Af Amer: 58 mL/min — ABNORMAL LOW (ref >60)
GFR calc non Af Amer: 50 mL/min — ABNORMAL LOW (ref >60)
Glucose, Bld: 119 mg/dL — ABNORMAL HIGH (ref 70–99)
Potassium: 4.1 mmol/L (ref 3.5–5.1)
Sodium: 142 mmol/L (ref 135–145)
Total Bilirubin: 0.6 mg/dL (ref 0.3–1.2)
Total Protein: 7.2 g/dL (ref 6.5–8.1)

## 2018-10-16 LAB — CBC WITH DIFFERENTIAL/PLATELET
Abs Immature Granulocytes: 0.02 10*3/uL (ref 0.00–0.07)
Basophils Absolute: 0.1 10*3/uL (ref 0.0–0.1)
Basophils Relative: 1 %
Eosinophils Absolute: 0.1 10*3/uL (ref 0.0–0.5)
Eosinophils Relative: 1 %
HCT: 37.6 % — ABNORMAL LOW (ref 39.0–52.0)
Hemoglobin: 12.1 g/dL — ABNORMAL LOW (ref 13.0–17.0)
Immature Granulocytes: 0 %
Lymphocytes Relative: 17 %
Lymphs Abs: 1.2 10*3/uL (ref 0.7–4.0)
MCH: 28.6 pg (ref 26.0–34.0)
MCHC: 32.2 g/dL (ref 30.0–36.0)
MCV: 88.9 fL (ref 80.0–100.0)
Monocytes Absolute: 0.4 10*3/uL (ref 0.1–1.0)
Monocytes Relative: 6 %
Neutro Abs: 5.1 10*3/uL (ref 1.7–7.7)
Neutrophils Relative %: 75 %
Platelets: 244 10*3/uL (ref 150–400)
RBC: 4.23 MIL/uL (ref 4.22–5.81)
RDW: 14 % (ref 11.5–15.5)
WBC: 6.9 10*3/uL (ref 4.0–10.5)
nRBC: 0 % (ref 0.0–0.2)

## 2018-10-16 LAB — PSA: Prostatic Specific Antigen: 0.06 ng/mL (ref 0.00–4.00)

## 2018-10-16 MED ORDER — SODIUM CHLORIDE 0.9 % IV SOLN
Freq: Once | INTRAVENOUS | Status: AC
  Administered 2018-10-16: 15:00:00 via INTRAVENOUS
  Filled 2018-10-16: qty 250

## 2018-10-16 MED ORDER — ZOLEDRONIC ACID 4 MG/100ML IV SOLN
4.0000 mg | INTRAVENOUS | Status: DC
  Administered 2018-10-16: 4 mg via INTRAVENOUS
  Filled 2018-10-16: qty 100

## 2018-10-16 MED ORDER — LEUPROLIDE ACETATE (6 MONTH) 45 MG IM KIT
45.0000 mg | PACK | INTRAMUSCULAR | Status: DC
  Administered 2018-10-16: 45 mg via INTRAMUSCULAR
  Filled 2018-10-16: qty 45

## 2018-10-16 NOTE — Progress Notes (Signed)
Patient is here today for follow up, he is doing well no complaints.  

## 2018-10-19 ENCOUNTER — Encounter: Payer: Self-pay | Admitting: Oncology

## 2018-10-19 NOTE — Progress Notes (Signed)
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 He also has a past history of prostate cancer about 15 years ago(Gleasons score 8)and was treated with prostatectomy at that time. He did not receive any radiation treatment and does not remember if his PSA was subsequently monitored. PSA in March 2015 was 7.9 and in 2014 was 6.7 after prostatectomy. He was last seen by medical oncology Dr. Everlene Other in 2015 and at that time his PSA was 9.2 previously was supposed to follow-up after 2 months and ADT was supposed to be started at that time the patient was lost to follow-up.  He was seen by Dr. Golden Pop and his primary care doctor he saw until about 2 years ago. He has had ongoing chronic back pain and has had back surgery in the past but was having worsening back pain over the last few months and was seen by Dr. Raul Del.   MRI of the lumbar spine on 11/22/2016 revealed extensive osseous metastatic disease with pathologic compression fracture of the L3 vertebral body. Posteriorly displace fragments and metastatic soft tissue result in severe canal stenosis and compression of the thecal sac at this level.  CT chest abdomen and pelvis on 11/25/2016 did not reveal any evidence of metastatic disease other than his bones  Patient had decompression surgery of his L3vertebral  body which showed:Metastatic adenocarcinoma with features consistent with primary prostatic origin. See note.  Note: The patient history of previously diagnosed prostate carcinoma is noted. Immunoperoxidase stains were performed on formalin-fixed (non-decalcified) paraffin-embedded tissue in block A3 with appropriate positive controls. The tumor cells stain with CK8/18, PROSTATIC SPECIFIC ANTIGEN, PROSTATIC ACID PHOSPHATASE and P501S. The tumor cells do not stain with CYTOKERATIN 7, CYTOKERATIN 20, CK5/6, P40, GATA-3, CDX-2 or THYROID TRANSCRIPTION FACTOR (TTF-1). These findings are in support of the interpretation of metastatic adenocarcinoma with features consistent with primary prostatic origin.  Patient also has primary hyperparathyroidism and hypercalcemia secondary to it for which he sees Dr.Akhter from [facility] and has been referred to surgery for the same  Abiraterone was started for high risk castrate sensitive prostate cancer on 02/14/2017.Baseline bone density showed osteopenia with a T score of -1.9 at the right femur neck.   Interval history-tolerating Zytiga well without any significant side effects.  Leg swelling has improved.  He is currently taking Sensipar 3 times a week.  ECOG PS- 1 Pain scale- 0 Opioid associated constipation- no  Review of systems- Review of Systems  Constitutional: Positive for malaise/fatigue. Negative for chills, fever and weight loss.  HENT: Negative for congestion, ear discharge and nosebleeds.   Eyes: Negative for blurred vision.  Respiratory: Negative for cough, hemoptysis, sputum production, shortness of breath and wheezing.   Cardiovascular: Negative for chest pain, palpitations, orthopnea and claudication.  Gastrointestinal: Negative for abdominal pain, blood in stool, constipation, diarrhea, heartburn, melena, nausea and vomiting.  Genitourinary: Negative for dysuria, flank pain, frequency, hematuria and urgency.  Musculoskeletal: Negative for  back pain, joint pain and myalgias.  Skin: Negative for rash.  Neurological: Negative for dizziness, tingling, focal weakness, seizures, weakness and headaches.  Endo/Heme/Allergies: Does not bruise/bleed easily.  Psychiatric/Behavioral: Negative for depression and suicidal ideas. The patient does not have insomnia.        No Known Allergies   Past Medical History:  Diagnosis Date  . Arthritis   . Cancer [facility])    Prostate CA approximately 11 years ago  . Hyperlipidemia   . Hypertension   . Myocardial infarction [facility])      Past Surgical History:  Procedure Laterality Date  . APPENDECTOMY    . BACK SURGERY    . CARDIAC CATHETERIZATION     stent x 1  . CHOLECYSTECTOMY    . LUMBAR LAMINECTOMY/DECOMPRESSION MICRODISCECTOMY Left 08/20/2012   Procedure: LUMBAR LAMINECTOMY/DECOMPRESSION MICRODISCECTOMY 1 LEVEL;  Surgeon: Faythe Ghee, MD;  Location: [facility];  Service: Neurosurgery;  Laterality: Left;  lumbar four-five  . PROSTATE SURGERY    . TONSILLECTOMY      Social History   Socioeconomic History  . Marital status: Married    Spouse name: Not on file  . Number of children: Not on file  . Years of education: Not on file  . Highest education level: Not on file  Occupational History  . Not on file  Social Needs  . Financial resource strain: Not on file  . Food insecurity    Worry: Not on file    Inability: Not on file  . Transportation needs    Medical: Not on file    Non-medical: Not on file  Tobacco Use  . Smoking status: Never Smoker  . Smokeless tobacco: Never Used  Substance and Sexual Activity  . Alcohol use: No  . Drug use: No  . Sexual activity: Not on file  Lifestyle  . Physical activity    Days per week: Not on file    Minutes per session: Not on file  . Stress: Not on file  Relationships  . Social Herbalist on phone: Not on file    Gets together: Not on file    Attends religious service: Not on file    Active member of club  or organization: Not on file    Attends meetings of clubs or organizations: Not on file    Relationship status: Not on file  . Intimate partner violence    Fear of current or ex partner: Not on file    Emotionally abused: Not on file    Physically abused: Not on file    Forced sexual activity: Not on file  Other Topics Concern  . Not on file  Social History Narrative  . Not on file    Family History  Problem Relation Age of Onset  . Stroke Mother   . Heart attack Father      Current Outpatient Medications:  .  abiraterone acetate (ZYTIGA) 250 MG tablet, Take 4 tablets (1,000 mg total) by mouth daily. Take on an empty stomach 1 hour before or 2 hours after a meal, Disp: 120 tablet, Rfl: 11 .  aspirin EC 81 MG tablet, Take 81 mg by mouth daily., Disp: , Rfl:  .  atorvastatin (LIPITOR) 10 MG tablet, Take 10 mg by mouth daily., Disp: , Rfl:  .  CALCIUM PO, Take 1 tablet by mouth 3 (three) times a week., Disp: , Rfl:  .  carvedilol (COREG) 25 MG tablet, Take 25 mg by mouth 2 (  two) times daily., Disp: , Rfl:  .  cetirizine (ZYRTEC) 10 MG tablet, Take 10 mg by mouth daily., Disp: , Rfl:  .  cholecalciferol (VITAMIN D) 1000 units tablet, Take 2,000 Units by mouth daily., Disp: , Rfl:  .  clopidogrel (PLAVIX) 75 MG tablet, Take 75 mg by mouth daily., Disp: , Rfl:  .  diltiazem (CARDIZEM CD) 120 MG 24 hr capsule, Take by mouth., Disp: , Rfl:  .  diltiazem (CARDIZEM SR) 120 MG 12 hr capsule, Take 120 mg by mouth daily. , Disp: , Rfl:  .  furosemide (LASIX) 20 MG tablet, Take 20 mg by mouth daily., Disp: , Rfl: 11 .  hydrALAZINE (APRESOLINE) 25 MG tablet, Take 25 mg by mouth 3 (three) times daily., Disp: , Rfl:  .  lisinopril (PRINIVIL,ZESTRIL) 40 MG tablet, Take 40 mg by mouth daily., Disp: , Rfl: 3 .  metoprolol tartrate (LOPRESSOR) 25 MG tablet, Take 50 mg by mouth 2 (two) times daily. , Disp: , Rfl:  .  nitroGLYCERIN (NITROSTAT) 0.4 MG SL tablet, As directed, Disp: , Rfl:  .  predniSONE  (DELTASONE) 5 MG tablet, Take 1 tablet by mouth once daily with breakfast, Disp: 90 tablet, Rfl: 0  Physical exam:  Vitals:   10/16/18 1350  BP: (!) 142/90  Resp: 16  Temp: 97.8 F (36.6 C)  Weight: 193 lb 3.2 oz (87.6 kg)   Physical Exam Constitutional:      General: He is not in acute distress. HENT:     Head: Normocephalic and atraumatic.  Eyes:     Pupils: Pupils are equal, round, and reactive to light.  Neck:     Musculoskeletal: Normal range of motion.  Cardiovascular:     Rate and Rhythm: Normal rate and regular rhythm.     Heart sounds: Normal heart sounds.  Pulmonary:     Effort: Pulmonary effort is normal.     Breath sounds: Normal breath sounds.  Abdominal:     General: Bowel sounds are normal.     Palpations: Abdomen is soft.  Musculoskeletal:     Right lower leg: No edema.     Left lower leg: No edema.  Skin:    General: Skin is warm and dry.  Neurological:     Mental Status: He is alert and oriented to person, place, and time.      CMP Latest Ref Rng & Units 10/16/2018  Glucose 70 - 99 mg/dL 119(H)  BUN 8 - 23 mg/dL 25(H)  Creatinine 0.61 - 1.24 mg/dL 1.32(H)  Sodium 135 - 145 mmol/L 142  Potassium 3.5 - 5.1 mmol/L 4.1  Chloride 98 - 111 mmol/L 106  CO2 22 - 32 mmol/L 27  Calcium 8.9 - 10.3 mg/dL 11.0(H)  Total Protein 6.5 - 8.1 g/dL 7.2  Total Bilirubin 0.3 - 1.2 mg/dL 0.6  Alkaline Phos 38 - 126 U/L 70  AST 15 - 41 U/L 14(L)  ALT 0 - 44 U/L 12   CBC Latest Ref Rng & Units 10/16/2018  WBC 4.0 - 10.5 K/uL 6.9  Hemoglobin 13.0 - 17.0 g/dL 12.1(L)  Hematocrit 39.0 - 52.0 % 37.6(L)  Platelets 150 - 400 K/uL 244      Assessment and plan- Patient is a 84 y.o. male with metastatic castrate sensitive prostate cancer with bone metastases.  He is currently on Lupron and Zytiga  1.  Patient has been receiving Lupron every 6 months and he will receive his Lupron today.  He will also continue [place]  along with prednisone. Clinically he is responding  well and his PSA is down to 0.06 today.  It has been stable around that range  2.  Patient has baseline osteopenia and get Zometa every 6 months. He is due for Zometa today.  3.  Hypercalcemia: Chronic follows up with endocrinology and currently on Sensipar 3 times a week.  4.  AKI: I have advised him to increase his fluid intake over the next several days and we will repeat a BMP in 2 weeks time  I will see him back in 3 months with CBC with differential, CMP and PSA   Visit Diagnosis 1. Prostate cancer metastatic to bone ([place])   2. High risk medication use   3. AKI (acute kidney injury) ([place])      Dr. Randa Evens, MD, MPH [facility] at [facility] [phone] 10/19/2018 8:21 AM

## 2018-10-28 MED FILL — ABIRATERONE ACETATE 250 MG: 250 | 30 days supply | Qty: 120 | Fill #7

## 2018-10-29 ENCOUNTER — Other Ambulatory Visit: Payer: Self-pay

## 2018-10-30 ENCOUNTER — Inpatient Hospital Stay: Payer: Medicare Other

## 2018-10-30 ENCOUNTER — Other Ambulatory Visit: Payer: Self-pay

## 2018-10-30 DIAGNOSIS — C61 Malignant neoplasm of prostate: Secondary | ICD-10-CM

## 2018-10-30 DIAGNOSIS — Z5111 Encounter for antineoplastic chemotherapy: Secondary | ICD-10-CM | POA: Diagnosis not present

## 2018-10-30 LAB — BASIC METABOLIC PANEL
Anion gap: 10 (ref 5–15)
BUN: 21 mg/dL (ref 8–23)
CO2: 25 mmol/L (ref 22–32)
Calcium: 10.1 mg/dL (ref 8.9–10.3)
Chloride: 106 mmol/L (ref 98–111)
Creatinine, Ser: 1.13 mg/dL (ref 0.61–1.24)
GFR calc Af Amer: >60 mL/min (ref >60)
GFR calc non Af Amer: >60 mL/min (ref >60)
Glucose, Bld: 134 mg/dL — ABNORMAL HIGH (ref 70–99)
Potassium: 4.3 mmol/L (ref 3.5–5.1)
Sodium: 141 mmol/L (ref 135–145)

## 2018-11-25 MED FILL — ABIRATERONE ACETATE 250 MG: 250 | 30 days supply | Qty: 120 | Fill #8

## 2018-11-30 ENCOUNTER — Telehealth: Payer: Self-pay | Admitting: Pharmacy Technician

## 2018-11-30 NOTE — Telephone Encounter (Signed)
Oral Oncology Patient Advocate Encounter  Was successful in securing patient a $8000 grant from [employer] to provide copayment coverage for Zytiga.  This will keep the out of pocket expense at $0.     [employer] ID: [id number]  I have spoken with the patient.   The billing information is as follows and has been shared with [place].    RxBin: [id number] PCN: [id number] Member ID: [id number] Group ID: [id number] Dates of Eligibility: 10/31/2018 through 10/30/2019  [place] [facility] [place] Phone [phone] Fax [phone] 11/30/2018 11:22 AM

## 2018-12-17 ENCOUNTER — Other Ambulatory Visit: Payer: Self-pay | Admitting: Oncology

## 2018-12-24 MED FILL — ABIRATERONE ACETATE 250 MG: 250 | 30 days supply | Qty: 120 | Fill #9

## 2018-12-30 ENCOUNTER — Telehealth: Payer: Self-pay | Admitting: Pharmacy Technician

## 2018-12-30 NOTE — Telephone Encounter (Signed)
Oral Oncology Patient Advocate Encounter   Was successful in securing patient an $73 grant from Patient [facility]) to provide copayment coverage for Zytiga.  This will keep the out of pocket expense at $0.     The billing information remains the same from previous grant:   Member ID: [id number] Group ID: [id number] RxBin: [id number] Dates of Eligibility: 12/10/2018 through 12/09/2019  [place] Patient [place] Phone [phone] Fax [phone] 12/30/2018 12:37 PM

## 2019-01-15 ENCOUNTER — Inpatient Hospital Stay: Payer: Medicare Other | Attending: Oncology

## 2019-01-15 ENCOUNTER — Inpatient Hospital Stay: Payer: Medicare Other | Admitting: Oncology

## 2019-01-21 ENCOUNTER — Telehealth: Payer: Self-pay | Admitting: Oncology

## 2019-01-21 NOTE — Telephone Encounter (Signed)
LM to try to RS no show appt.

## 2019-01-26 MED FILL — ABIRATERONE ACETATE 250 MG: 250 | 30 days supply | Qty: 120 | Fill #10

## 2019-03-02 MED FILL — ABIRATERONE ACETATE 250 MG: 250 | 30 days supply | Qty: 120 | Fill #11

## 2019-03-24 ENCOUNTER — Other Ambulatory Visit: Payer: Self-pay | Admitting: Oncology

## 2019-03-24 DIAGNOSIS — C7951 Secondary malignant neoplasm of bone: Secondary | ICD-10-CM

## 2019-03-24 DIAGNOSIS — C61 Malignant neoplasm of prostate: Secondary | ICD-10-CM

## 2019-03-29 ENCOUNTER — Telehealth: Payer: Self-pay | Admitting: *Deleted

## 2019-03-29 DIAGNOSIS — C61 Malignant neoplasm of prostate: Secondary | ICD-10-CM

## 2019-03-29 MED ORDER — ABIRATERONE ACETATE 250 MG PO TABS: 1000.0000 mg | ORAL_TABLET | Freq: Every day | ORAL | 0 refills | Status: DC

## 2019-03-29 MED FILL — ABIRATERONE ACETATE 250 MG: 250 | 30 days supply | Qty: 120 | Fill #0

## 2019-03-29 NOTE — Telephone Encounter (Signed)
[facility] called requesting refill of Zytiga for this patient. He did not show up for his follow up appointment in October and has no follow up appointments scheduled. Please advise

## 2019-03-29 NOTE — Telephone Encounter (Signed)
Refill sent and schedule message sent as well

## 2019-03-29 NOTE — Telephone Encounter (Signed)
We can give him a months supply and he needs to be rescheduled to see me next week. Labs first followed by video visit is fine too or in person

## 2019-04-03 ENCOUNTER — Other Ambulatory Visit: Payer: Self-pay | Admitting: Oncology

## 2019-04-05 ENCOUNTER — Other Ambulatory Visit: Payer: Self-pay

## 2019-04-05 NOTE — Progress Notes (Signed)
Patient pre screened for office appointment, no questions or concerns today. Patient reminded of upcoming appointment time and date. 

## 2019-04-06 ENCOUNTER — Other Ambulatory Visit: Payer: Self-pay

## 2019-04-06 ENCOUNTER — Inpatient Hospital Stay: Payer: Medicare Other | Attending: Oncology

## 2019-04-06 ENCOUNTER — Inpatient Hospital Stay (HOSPITAL_BASED_OUTPATIENT_CLINIC_OR_DEPARTMENT_OTHER): Payer: Medicare Other | Admitting: Oncology

## 2019-04-06 VITALS — BP 160/83 | HR 67 | Temp 97.8°F | Wt 200.0 lb

## 2019-04-06 DIAGNOSIS — Z8249 Family history of ischemic heart disease and other diseases of the circulatory system: Secondary | ICD-10-CM | POA: Diagnosis not present

## 2019-04-06 DIAGNOSIS — E785 Hyperlipidemia, unspecified: Secondary | ICD-10-CM | POA: Diagnosis not present

## 2019-04-06 DIAGNOSIS — Z7952 Long term (current) use of systemic steroids: Secondary | ICD-10-CM | POA: Diagnosis not present

## 2019-04-06 DIAGNOSIS — Z79899 Other long term (current) drug therapy: Secondary | ICD-10-CM | POA: Diagnosis not present

## 2019-04-06 DIAGNOSIS — I252 Old myocardial infarction: Secondary | ICD-10-CM | POA: Diagnosis not present

## 2019-04-06 DIAGNOSIS — Z7982 Long term (current) use of aspirin: Secondary | ICD-10-CM | POA: Insufficient documentation

## 2019-04-06 DIAGNOSIS — M85851 Other specified disorders of bone density and structure, right thigh: Secondary | ICD-10-CM

## 2019-04-06 DIAGNOSIS — C7951 Secondary malignant neoplasm of bone: Secondary | ICD-10-CM | POA: Diagnosis not present

## 2019-04-06 DIAGNOSIS — M8580 Other specified disorders of bone density and structure, unspecified site: Secondary | ICD-10-CM | POA: Diagnosis not present

## 2019-04-06 DIAGNOSIS — C61 Malignant neoplasm of prostate: Secondary | ICD-10-CM

## 2019-04-06 DIAGNOSIS — I10 Essential (primary) hypertension: Secondary | ICD-10-CM | POA: Diagnosis not present

## 2019-04-06 LAB — COMPREHENSIVE METABOLIC PANEL
ALT: 11 U/L (ref 0–44)
AST: 14 U/L — ABNORMAL LOW (ref 15–41)
Albumin: 4 g/dL (ref 3.5–5.0)
Alkaline Phosphatase: 70 U/L (ref 38–126)
Anion gap: 6 (ref 5–15)
BUN: 18 mg/dL (ref 8–23)
CO2: 30 mmol/L (ref 22–32)
Calcium: 11.4 mg/dL — ABNORMAL HIGH (ref 8.9–10.3)
Chloride: 103 mmol/L (ref 98–111)
Creatinine, Ser: 1.03 mg/dL (ref 0.61–1.24)
GFR calc Af Amer: >60 mL/min (ref >60)
GFR calc non Af Amer: >60 mL/min (ref >60)
Glucose, Bld: 111 mg/dL — ABNORMAL HIGH (ref 70–99)
Potassium: 4.3 mmol/L (ref 3.5–5.1)
Sodium: 139 mmol/L (ref 135–145)
Total Bilirubin: 0.5 mg/dL (ref 0.3–1.2)
Total Protein: 7 g/dL (ref 6.5–8.1)

## 2019-04-06 LAB — CBC WITH DIFFERENTIAL/PLATELET
Abs Immature Granulocytes: 0.03 10*3/uL (ref 0.00–0.07)
Basophils Absolute: 0 10*3/uL (ref 0.0–0.1)
Basophils Relative: 0 %
Eosinophils Absolute: 0.1 10*3/uL (ref 0.0–0.5)
Eosinophils Relative: 1 %
HCT: 37 % — ABNORMAL LOW (ref 39.0–52.0)
Hemoglobin: 11.9 g/dL — ABNORMAL LOW (ref 13.0–17.0)
Immature Granulocytes: 0 %
Lymphocytes Relative: 19 %
Lymphs Abs: 1.5 10*3/uL (ref 0.7–4.0)
MCH: 28.5 pg (ref 26.0–34.0)
MCHC: 32.2 g/dL (ref 30.0–36.0)
MCV: 88.7 fL (ref 80.0–100.0)
Monocytes Absolute: 0.5 10*3/uL (ref 0.1–1.0)
Monocytes Relative: 7 %
Neutro Abs: 5.8 10*3/uL (ref 1.7–7.7)
Neutrophils Relative %: 73 %
Platelets: 232 10*3/uL (ref 150–400)
RBC: 4.17 MIL/uL — ABNORMAL LOW (ref 4.22–5.81)
RDW: 14.1 % (ref 11.5–15.5)
WBC: 8 10*3/uL (ref 4.0–10.5)
nRBC: 0 % (ref 0.0–0.2)

## 2019-04-06 LAB — PSA: Prostatic Specific Antigen: 0.02 ng/mL (ref 0.00–4.00)

## 2019-04-08 ENCOUNTER — Encounter: Payer: Self-pay | Admitting: Oncology

## 2019-04-08 NOTE — Progress Notes (Signed)
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 He also has a past history of prostate cancer about 15 years ago(Gleasons score 8)and was treated with prostatectomy at that time. He did not receive any radiation treatment and does not remember if his PSA was subsequently monitored. PSA in March 2015 was 7.9 and in 2014 was 6.7 after prostatectomy. He was last seen by medical oncology Dr. Everlene Other in 2015 and at that time his PSA was 9.2 previously was supposed to follow-up after 2 months and ADT was supposed to be started at that time the patient was lost to follow-up.  He was seen by Dr. Golden Pop and his primary care doctor he saw until about 2 years ago. He has had ongoing chronic back pain and has had back surgery in the past but was having worsening back pain over the last few months and was seen by Dr. Raul Del.   MRI of the lumbar spine on 11/22/2016 revealed extensive osseous metastatic disease with pathologic compression fracture of the L3 vertebral body. Posteriorly displace fragments and metastatic soft tissue result in severe canal stenosis and compression of the thecal sac at this level.  CT chest abdomen and pelvis on 11/25/2016 did not reveal any evidence of metastatic disease other than his bones  Patient had decompression surgery of his L3vertebral body  which showed:Metastatic adenocarcinoma with features consistent with primary prostatic origin. See note.  Note: The patient history of previously diagnosed prostate carcinoma is noted. Immunoperoxidase stains were performed on formalin-fixed (non-decalcified) paraffin-embedded tissue in block A3 with appropriate positive controls. The tumor cells stain with CK8/18, PROSTATIC SPECIFIC ANTIGEN, PROSTATIC ACID PHOSPHATASE and P501S. The tumor cells do not stain with CYTOKERATIN 7, CYTOKERATIN 20, CK5/6, P40, GATA-3, CDX-2 or THYROID TRANSCRIPTION FACTOR (TTF-1). These findings are in support of the interpretation of metastatic adenocarcinoma with features consistent with primary prostatic origin.  Patient also has primary hyperparathyroidism and hypercalcemia secondary to it for which he sees Dr.Akhter from [facility] and has been referred to surgery for the same  Abiraterone was started for high risk castrate sensitive prostate cancer on 02/14/2017.Baseline bone density showed osteopenia with a T score of -1.9 at the right femur neck.  Interval history- tolerating zytiga well along with prednsione. Reports not missing any doses. Leg swelling has improved. Has mild chronic back pain well controlled.   ECOG PS- 1 Pain scale- 0 Opioid associated constipation- no  Review of systems- Review of Systems  Constitutional: Positive for malaise/fatigue. Negative for chills, fever and weight loss.  HENT: Negative for congestion, ear discharge and nosebleeds.   Eyes: Negative for blurred vision.  Respiratory: Negative for cough, hemoptysis, sputum production, shortness of breath and wheezing.   Cardiovascular: Negative for chest pain, palpitations, orthopnea and claudication.  Gastrointestinal: Negative for abdominal pain, blood in stool, constipation, diarrhea, heartburn, melena, nausea and vomiting.  Genitourinary: Negative for dysuria, flank pain, frequency, hematuria and urgency.  Musculoskeletal: Negative  for  back pain, joint pain and myalgias.  Skin: Negative for rash.  Neurological: Negative for dizziness, tingling, focal weakness, seizures, weakness and headaches.  Endo/Heme/Allergies: Does not bruise/bleed easily.  Psychiatric/Behavioral: Negative for depression and suicidal ideas. The patient does not have insomnia.       No Known Allergies   Past Medical History:  Diagnosis Date  . Arthritis   . Cancer [facility])    Prostate CA approximately 11 years ago  . Hyperlipidemia   . Hypertension   . Myocardial infarction [facility])      Past Surgical History:  Procedure Laterality Date  . APPENDECTOMY    . BACK SURGERY    . CARDIAC CATHETERIZATION     stent x 1  . CHOLECYSTECTOMY    . LUMBAR LAMINECTOMY/DECOMPRESSION MICRODISCECTOMY Left 08/20/2012   Procedure: LUMBAR LAMINECTOMY/DECOMPRESSION MICRODISCECTOMY 1 LEVEL;  Surgeon: Faythe Ghee, MD;  Location: [facility];  Service: Neurosurgery;  Laterality: Left;  lumbar four-five  . PROSTATE SURGERY    . TONSILLECTOMY      Social History   Socioeconomic History  . Marital status: Married    Spouse name: Not on file  . Number of children: Not on file  . Years of education: Not on file  . Highest education level: Not on file  Occupational History  . Not on file  Tobacco Use  . Smoking status: Never Smoker  . Smokeless tobacco: Never Used  Substance and Sexual Activity  . Alcohol use: No  . Drug use: No  . Sexual activity: Not on file  Other Topics Concern  . Not on file  Social History Narrative  . Not on file   Social Determinants of Health   Financial Resource Strain:   . Difficulty of Paying Living Expenses: Not on file  Food Insecurity:   . Worried About Charity fundraiser in the Last Year: Not on file  . Ran Out of Food in the Last Year: Not on file  Transportation Needs:   . Lack of Transportation (Medical): Not on file  . Lack of Transportation (Non-Medical): Not on file  Physical Activity:   . Days of  Exercise per Week: Not on file  . Minutes of Exercise per Session: Not on file  Stress:   . Feeling of Stress : Not on file  Social Connections:   . Frequency of Communication with Friends and Family: Not on file  . Frequency of Social Gatherings with Friends and Family: Not on file  . Attends Religious Services: Not on file  . Active Member of Clubs or Organizations: Not on file  . Attends Archivist Meetings: Not on file  . Marital Status: Not on file  Intimate Partner Violence:   . Fear of Current or Ex-Partner: Not on file  . Emotionally Abused: Not on file  . Physically Abused: Not on file  . Sexually Abused: Not on file    Family History  Problem Relation Age of Onset  . Stroke Mother   . Heart attack Father      Current Outpatient Medications:  .  abiraterone acetate (ZYTIGA) 250 MG tablet, Take 4 tablets (1,000 mg total) by mouth daily. Take on an empty stomach 1 hour before or 2 hours after a meal, Disp: 120 tablet, Rfl: 0 .  aspirin EC 81 MG tablet, Take 81 mg by mouth daily., Disp: , Rfl:  .  atorvastatin (LIPITOR) 10 MG tablet, Take 10 mg by mouth daily., Disp: , Rfl:  .  CALCIUM PO, Take 1 tablet by mouth 3 (three) times a week., Disp: , Rfl:  .  carvedilol (COREG) 25 MG tablet, Take 25 mg by mouth 2 (two) times daily., Disp: , Rfl:  .  cetirizine (ZYRTEC) 10 MG tablet, Take 10 mg by mouth daily., Disp: , Rfl:  .  cholecalciferol (VITAMIN D) 1000 units tablet, Take 2,000 Units by mouth daily., Disp: , Rfl:  .  clopidogrel (PLAVIX) 75 MG tablet, Take 75 mg by mouth daily., Disp: , Rfl:  .  diltiazem (CARDIZEM CD) 120 MG 24 hr capsule, Take by mouth., Disp: , Rfl:  .  diltiazem (CARDIZEM SR) 120 MG 12 hr capsule, Take 120 mg by mouth daily. , Disp: , Rfl:  .  furosemide (LASIX) 20 MG tablet, Take 20 mg by mouth daily., Disp: , Rfl: 11 .  hydrALAZINE (APRESOLINE) 25 MG tablet, Take 25 mg by mouth 3 (three) times daily., Disp: , Rfl:  .  lisinopril  (PRINIVIL,ZESTRIL) 40 MG tablet, Take 40 mg by mouth daily., Disp: , Rfl: 3 .  metoprolol tartrate (LOPRESSOR) 25 MG tablet, Take 50 mg by mouth 2 (two) times daily. , Disp: , Rfl:  .  nitroGLYCERIN (NITROSTAT) 0.4 MG SL tablet, As directed, Disp: , Rfl:  .  predniSONE (DELTASONE) 5 MG tablet, Take 1 tablet by mouth once daily with breakfast, Disp: 90 tablet, Rfl: 0  Physical exam:  Vitals:   04/06/19 1356  BP: (!) 160/83  Pulse: 67  Temp: 97.8 F (36.6 C)  TempSrc: Tympanic  SpO2: 98%  Weight: 200 lb (90.7 kg)   Physical Exam Constitutional:      General: He is not in acute distress. HENT:     Head: Normocephalic and atraumatic.  Eyes:     Pupils: Pupils are equal, round, and reactive to light.  Cardiovascular:     Rate and Rhythm: Normal rate and regular rhythm.     Heart sounds: Normal heart sounds.  Pulmonary:     Effort: Pulmonary effort is normal.     Breath sounds: Normal breath sounds.  Abdominal:     General: Bowel sounds are normal.     Palpations: Abdomen is soft.  Musculoskeletal:     Cervical back: Normal range of motion.  Skin:    General: Skin is warm and dry.  Neurological:     Mental Status: He is alert and oriented to person, place, and time.      CMP Latest Ref Rng & Units 04/06/2019  Glucose 70 - 99 mg/dL 111(H)  BUN 8 - 23 mg/dL 18  Creatinine 0.61 - 1.24 mg/dL 1.03  Sodium 135 - 145 mmol/L 139  Potassium 3.5 - 5.1 mmol/L 4.3  Chloride 98 - 111 mmol/L 103  CO2 22 - 32 mmol/L 30  Calcium 8.9 - 10.3 mg/dL 11.4(H)  Total Protein 6.5 - 8.1 g/dL 7.0  Total Bilirubin 0.3 - 1.2 mg/dL 0.5  Alkaline Phos 38 - 126 U/L 70  AST 15 - 41 U/L 14(L)  ALT 0 - 44 U/L 11   CBC Latest Ref Rng & Units 04/06/2019  WBC 4.0 - 10.5 K/uL 8.0  Hemoglobin 13.0 - 17.0 g/dL 11.9(L)  Hematocrit 39.0 - 52.0 % 37.0(L)  Platelets 150 - 400 K/uL 232     Assessment and plan- Patient is a 85 y.o. male  with metastatic castrate sensitive prostate cancer with bone  metastases.he is currently on lupron and zytiga and he is here for routine f/u of prostate cancer  1. Prostate cancer- I have personally reviewed his labs cbc, cmp and psa today. remains castrate sensitive. psa 0.02 previously 0.06. continue zytiga. Will get lupron later this week or next week after we get it reauthorized by insurance. I will repeat ct chest abdomen pelvis and bone scan in 2 months. I will get his scans yearly. See him after his scans  2. Osteopenia- he is getting zometa Q6 months and will get it along with lupron  3. Hypercalcemia- chronic since many years. Was following up with endocrinology then stopped.    Visit Diagnosis 1. Prostate cancer metastatic to bone ([place])   2. Osteopenia of neck of right femur   3. High risk medication use   4. Hypercalcemia      Dr. Randa Evens, MD, MPH [facility] at [facility] [id number] 04/08/2019 12:35 PM

## 2019-04-15 ENCOUNTER — Other Ambulatory Visit: Payer: Self-pay

## 2019-04-16 ENCOUNTER — Other Ambulatory Visit: Payer: Self-pay

## 2019-04-16 ENCOUNTER — Inpatient Hospital Stay: Payer: Medicare Other

## 2019-04-16 VITALS — BP 153/95 | HR 59 | Temp 95.3°F | Resp 20

## 2019-04-16 DIAGNOSIS — C61 Malignant neoplasm of prostate: Secondary | ICD-10-CM | POA: Diagnosis not present

## 2019-04-16 MED ORDER — LEUPROLIDE ACETATE (6 MONTH) 45 MG ~~LOC~~ KIT
45.0000 mg | PACK | Freq: Once | SUBCUTANEOUS | Status: AC
  Administered 2019-04-16: 45 mg via SUBCUTANEOUS
  Filled 2019-04-16: qty 45

## 2019-04-16 MED ORDER — ZOLEDRONIC ACID 4 MG/100ML IV SOLN
4.0000 mg | INTRAVENOUS | Status: DC
  Administered 2019-04-16: 4 mg via INTRAVENOUS
  Filled 2019-04-16: qty 100

## 2019-04-16 MED ORDER — SODIUM CHLORIDE 0.9 % IV SOLN
Freq: Once | INTRAVENOUS | Status: AC
  Filled 2019-04-16: qty 250

## 2019-04-23 ENCOUNTER — Other Ambulatory Visit: Payer: Self-pay | Admitting: Oncology

## 2019-04-23 DIAGNOSIS — C61 Malignant neoplasm of prostate: Secondary | ICD-10-CM

## 2019-04-23 DIAGNOSIS — C7951 Secondary malignant neoplasm of bone: Secondary | ICD-10-CM

## 2019-04-27 MED FILL — ABIRATERONE ACETATE 250 MG: 250 | 30 days supply | Qty: 120 | Fill #0

## 2019-05-24 ENCOUNTER — Other Ambulatory Visit: Payer: Self-pay | Admitting: Oncology

## 2019-05-24 DIAGNOSIS — C7951 Secondary malignant neoplasm of bone: Secondary | ICD-10-CM

## 2019-05-24 DIAGNOSIS — C61 Malignant neoplasm of prostate: Secondary | ICD-10-CM

## 2019-05-24 NOTE — Telephone Encounter (Signed)
Contains abnormal data CBC with Differential Order: [id number] Status:  Final result  Visible to patient:  Yes (MyChart)  Next appt:  05/31/2019 at 11:00 AM in Radiology ([facility])  Dx:  Prostate cancer metastatic to bone (HCC)  Ref Range & Units 1 mo ago  WBC 4.0 - 10.5 K/uL 8.0   RBC 4.22 - 5.81 MIL/uL 4.17Low    Hemoglobin 13.0 - 17.0 g/dL 11.9Low    HCT 39.0 - 52.0 % 37.0Low    MCV 80.0 - 100.0 fL 88.7   MCH 26.0 - 34.0 pg 28.5   MCHC 30.0 - 36.0 g/dL 32.2   RDW 11.5 - 15.5 % 14.1   Platelets 150 - 400 K/uL 232   nRBC 0.0 - 0.2 % 0.0   Neutrophils Relative % % 73   Neutro Abs 1.7 - 7.7 K/uL 5.8   Lymphocytes Relative % 19   Lymphs Abs 0.7 - 4.0 K/uL 1.5   Monocytes Relative % 7   Monocytes Absolute 0.1 - 1.0 K/uL 0.5   Eosinophils Relative % 1   Eosinophils Absolute 0.0 - 0.5 K/uL 0.1   Basophils Relative % 0   Basophils Absolute 0.0 - 0.1 K/uL 0.0   Immature Granulocytes % 0   Abs Immature Granulocytes 0.00 - 0.07 K/uL 0.03   Comment: Performed at [facility], [address] [place], [place] [address]  Resulting Agency  [facility]      Specimen Collected: 04/06/19 13:49  Last Resulted: 04/06/19 13:58     Lab Flowsheet   Order Details   View Encounter   Lab and Collection Details   Routing   Result History         Other Results from 04/06/2019  PSA  Status:  Final result  Visible to patient:  Yes (MyChart)  Next appt:  05/31/2019 at 11:00 AM in Radiology ([facility])  Dx:  Prostate cancer metastatic to bone [facility]) Order: [id number]  Ref Range & Units 1 mo ago  Prostatic Specific Antigen 0.00 - 4.00 ng/mL 0.02   Comment: (NOTE)  While PSA levels of <=4.0 ng/ml are reported as reference range, some  men with levels below 4.0 ng/ml can have prostate cancer and many men  with PSA above 4.0 ng/ml do not have prostate cancer. Other tests  such as free PSA, age specific reference ranges, PSA velocity and PSA  doubling time may be helpful especially  in men less than 28 years  old.  Performed at [facility], [place] [address], [place], [place]  [address]   Resulting Agency  [facility]      Specimen Collected: 04/06/19 13:49  Last Resulted: 04/06/19 20:26     Lab Flowsheet   Order Details   View Encounter   Lab and Collection Details   Routing   Result History           Contains abnormal data Comprehensive metabolic panel  Status:  Final result  Visible to patient:  Yes (MyChart)  Next appt:  05/31/2019 at 11:00 AM in Radiology ([facility])  Dx:  Prostate cancer metastatic to bone [facility]) Order: [id number]  Ref Range & Units 1 mo ago  Sodium 135 - 145 mmol/L 139   Potassium 3.5 - 5.1 mmol/L 4.3   Chloride 98 - 111 mmol/L 103   CO2 22 - 32 mmol/L 30   Glucose, Bld 70 - 99 mg/dL 111High    BUN 8 - 23 mg/dL 18   Creatinine, Ser 0.61 - 1.24  mg/dL 1.03   Calcium 8.9 - 10.3 mg/dL 11.4High    Total Protein 6.5 - 8.1 g/dL 7.0   Albumin 3.5 - 5.0 g/dL 4.0   AST 15 - 41 U/L 14Low    ALT 0 - 44 U/L 11   Alkaline Phosphatase 38 - 126 U/L 70   Total Bilirubin 0.3 - 1.2 mg/dL 0.5   GFR calc non Af Amer >60 mL/min >60   GFR calc Af Amer >60 mL/min >60   Anion gap 5 - 15 6   Comment: Performed at [facility], [address] [place], [place] [address]  Resulting Agency  [facility]      Specimen Collected: 04/06/19 13:49  Last Resulted: 04/06/19 14:14

## 2019-05-27 MED FILL — ABIRATERONE ACETATE 250 MG: 250 | 30 days supply | Qty: 120 | Fill #0

## 2019-05-31 ENCOUNTER — Other Ambulatory Visit: Payer: Self-pay

## 2019-05-31 ENCOUNTER — Ambulatory Visit
Admission: RE | Admit: 2019-05-31 | Discharge: 2019-05-31 | Disposition: A | Discharge status: Home / Self Care | Payer: Medicare Other | Source: Ambulatory Visit | Attending: Oncology | Admitting: Oncology

## 2019-05-31 ENCOUNTER — Encounter (HOSPITAL_BASED_OUTPATIENT_CLINIC_OR_DEPARTMENT_OTHER)
Admission: RE | Admit: 2019-05-31 | Discharge: 2019-05-31 | Disposition: A | Discharge status: Home / Self Care | Payer: Medicare Other | Source: Ambulatory Visit | Attending: Oncology | Admitting: Oncology

## 2019-05-31 ENCOUNTER — Other Ambulatory Visit: Payer: Self-pay | Admitting: Oncology

## 2019-05-31 ENCOUNTER — Encounter
Admission: RE | Admit: 2019-05-31 | Discharge: 2019-05-31 | Disposition: A | Discharge status: Home / Self Care | Payer: Medicare Other | Source: Ambulatory Visit | Attending: Oncology | Admitting: Oncology

## 2019-05-31 DIAGNOSIS — C7951 Secondary malignant neoplasm of bone: Secondary | ICD-10-CM

## 2019-05-31 DIAGNOSIS — C61 Malignant neoplasm of prostate: Secondary | ICD-10-CM

## 2019-05-31 DIAGNOSIS — M85851 Other specified disorders of bone density and structure, right thigh: Secondary | ICD-10-CM | POA: Insufficient documentation

## 2019-05-31 LAB — POCT I-STAT CREATININE: Creatinine, Ser: 1 mg/dL (ref 0.61–1.24)

## 2019-05-31 MED ORDER — TECHNETIUM TC 99M MEDRONATE IV KIT
23.2000 | PACK | Freq: Once | INTRAVENOUS | Status: AC | PRN
  Administered 2019-05-31: 23.2 via INTRAVENOUS

## 2019-05-31 MED ORDER — IOHEXOL 300 MG/ML  SOLN
100.0000 mL | Freq: Once | INTRAMUSCULAR | Status: AC | PRN
  Administered 2019-05-31: 100 mL via INTRAVENOUS

## 2019-06-02 ENCOUNTER — Other Ambulatory Visit: Payer: Self-pay | Admitting: Pathology

## 2019-06-03 ENCOUNTER — Inpatient Hospital Stay: Payer: Medicare Other | Attending: Oncology

## 2019-06-03 DIAGNOSIS — Z7952 Long term (current) use of systemic steroids: Secondary | ICD-10-CM | POA: Insufficient documentation

## 2019-06-03 DIAGNOSIS — Z7982 Long term (current) use of aspirin: Secondary | ICD-10-CM | POA: Insufficient documentation

## 2019-06-03 DIAGNOSIS — Z923 Personal history of irradiation: Secondary | ICD-10-CM | POA: Insufficient documentation

## 2019-06-03 DIAGNOSIS — C61 Malignant neoplasm of prostate: Secondary | ICD-10-CM | POA: Insufficient documentation

## 2019-06-03 DIAGNOSIS — E785 Hyperlipidemia, unspecified: Secondary | ICD-10-CM | POA: Insufficient documentation

## 2019-06-03 DIAGNOSIS — M8580 Other specified disorders of bone density and structure, unspecified site: Secondary | ICD-10-CM | POA: Insufficient documentation

## 2019-06-03 DIAGNOSIS — C7951 Secondary malignant neoplasm of bone: Secondary | ICD-10-CM | POA: Insufficient documentation

## 2019-06-03 DIAGNOSIS — Z79899 Other long term (current) drug therapy: Secondary | ICD-10-CM | POA: Insufficient documentation

## 2019-06-03 DIAGNOSIS — I252 Old myocardial infarction: Secondary | ICD-10-CM | POA: Insufficient documentation

## 2019-06-03 DIAGNOSIS — Z9079 Acquired absence of other genital organ(s): Secondary | ICD-10-CM | POA: Insufficient documentation

## 2019-06-03 DIAGNOSIS — I10 Essential (primary) hypertension: Secondary | ICD-10-CM | POA: Insufficient documentation

## 2019-06-03 NOTE — Progress Notes (Signed)
Tumor Board Documentation  Carlos Perkins was presented by Dr Janese Banks at our Tumor Board on 06/03/2019, which included representatives from medical oncology, radiation oncology, navigation, pathology, radiology, surgical, surgical oncology, internal medicine, pharmacy, genetics, palliative care, research.  Carlos Perkins currently presents as a current patient, for discussion with history of the following treatments: surgical intervention(s), immunotherapy.  Additionally, we reviewed previous medical and familial history, history of present illness, and recent lab results along with all available histopathologic and imaging studies. The tumor board considered available treatment options and made the following recommendations: Additional screening, Biopsy PET in 6 -8 weeks, then possible biopsy depending on results  The following procedures/referrals were also placed: No orders of the defined types were placed in this encounter.   Clinical Trial Status: not discussed   Staging used: AJCC Stage Group  AJCC Staging:       Group: Stage 4 Prostate Cancer with Bone Mets   National site-specific guidelines NCCN were discussed with respect to the case.  Tumor board is a meeting of clinicians from various specialty areas who evaluate and discuss patients for whom a multidisciplinary approach is being considered. Final determinations in the plan of care are those of the provider(s). The responsibility for follow up of recommendations given during tumor board is that of the provider.   Today's extended care, comprehensive team conference, [name] was not present for the discussion and was not examined.   Multidisciplinary Tumor Board is a multidisciplinary case peer review process.  Decisions discussed in the Multidisciplinary Tumor Board reflect the opinions of the specialists present at the conference without having examined the patient.  Ultimately, treatment and diagnostic decisions rest with the  primary provider(s) and the patient.

## 2019-06-04 ENCOUNTER — Encounter: Payer: Self-pay | Admitting: Oncology

## 2019-06-04 ENCOUNTER — Inpatient Hospital Stay (HOSPITAL_BASED_OUTPATIENT_CLINIC_OR_DEPARTMENT_OTHER): Payer: Medicare Other | Admitting: Oncology

## 2019-06-04 ENCOUNTER — Other Ambulatory Visit: Payer: Self-pay

## 2019-06-04 ENCOUNTER — Inpatient Hospital Stay: Payer: Medicare Other

## 2019-06-04 VITALS — Temp 97.9°F | Wt 197.0 lb

## 2019-06-04 DIAGNOSIS — Z7982 Long term (current) use of aspirin: Secondary | ICD-10-CM | POA: Diagnosis not present

## 2019-06-04 DIAGNOSIS — M8580 Other specified disorders of bone density and structure, unspecified site: Secondary | ICD-10-CM | POA: Diagnosis not present

## 2019-06-04 DIAGNOSIS — Z79899 Other long term (current) drug therapy: Secondary | ICD-10-CM

## 2019-06-04 DIAGNOSIS — R9389 Abnormal findings on diagnostic imaging of other specified body structures: Secondary | ICD-10-CM | POA: Diagnosis not present

## 2019-06-04 DIAGNOSIS — Z923 Personal history of irradiation: Secondary | ICD-10-CM | POA: Diagnosis not present

## 2019-06-04 DIAGNOSIS — C61 Malignant neoplasm of prostate: Secondary | ICD-10-CM

## 2019-06-04 DIAGNOSIS — I252 Old myocardial infarction: Secondary | ICD-10-CM | POA: Diagnosis not present

## 2019-06-04 DIAGNOSIS — Z7952 Long term (current) use of systemic steroids: Secondary | ICD-10-CM | POA: Diagnosis not present

## 2019-06-04 DIAGNOSIS — C7951 Secondary malignant neoplasm of bone: Secondary | ICD-10-CM | POA: Diagnosis not present

## 2019-06-04 DIAGNOSIS — Z9079 Acquired absence of other genital organ(s): Secondary | ICD-10-CM | POA: Diagnosis not present

## 2019-06-04 DIAGNOSIS — E785 Hyperlipidemia, unspecified: Secondary | ICD-10-CM | POA: Diagnosis not present

## 2019-06-04 DIAGNOSIS — I10 Essential (primary) hypertension: Secondary | ICD-10-CM | POA: Diagnosis not present

## 2019-06-04 LAB — COMPREHENSIVE METABOLIC PANEL
ALT: 9 U/L (ref 0–44)
AST: 13 U/L — ABNORMAL LOW (ref 15–41)
Albumin: 3.7 g/dL (ref 3.5–5.0)
Alkaline Phosphatase: 64 U/L (ref 38–126)
Anion gap: 8 (ref 5–15)
BUN: 19 mg/dL (ref 8–23)
CO2: 27 mmol/L (ref 22–32)
Calcium: 10.7 mg/dL — ABNORMAL HIGH (ref 8.9–10.3)
Chloride: 102 mmol/L (ref 98–111)
Creatinine, Ser: 1.1 mg/dL (ref 0.61–1.24)
GFR calc Af Amer: >60 mL/min (ref >60)
GFR calc non Af Amer: >60 mL/min (ref >60)
Glucose, Bld: 115 mg/dL — ABNORMAL HIGH (ref 70–99)
Potassium: 4 mmol/L (ref 3.5–5.1)
Sodium: 137 mmol/L (ref 135–145)
Total Bilirubin: 0.7 mg/dL (ref 0.3–1.2)
Total Protein: 6.6 g/dL (ref 6.5–8.1)

## 2019-06-04 LAB — CBC WITH DIFFERENTIAL/PLATELET
Abs Immature Granulocytes: 0.03 10*3/uL (ref 0.00–0.07)
Basophils Absolute: 0 10*3/uL (ref 0.0–0.1)
Basophils Relative: 1 %
Eosinophils Absolute: 0.2 10*3/uL (ref 0.0–0.5)
Eosinophils Relative: 2 %
HCT: 36.6 % — ABNORMAL LOW (ref 39.0–52.0)
Hemoglobin: 11.3 g/dL — ABNORMAL LOW (ref 13.0–17.0)
Immature Granulocytes: 0 %
Lymphocytes Relative: 20 %
Lymphs Abs: 1.5 10*3/uL (ref 0.7–4.0)
MCH: 27.9 pg (ref 26.0–34.0)
MCHC: 30.9 g/dL (ref 30.0–36.0)
MCV: 90.4 fL (ref 80.0–100.0)
Monocytes Absolute: 0.5 10*3/uL (ref 0.1–1.0)
Monocytes Relative: 7 %
Neutro Abs: 5.2 10*3/uL (ref 1.7–7.7)
Neutrophils Relative %: 70 %
Platelets: 230 10*3/uL (ref 150–400)
RBC: 4.05 MIL/uL — ABNORMAL LOW (ref 4.22–5.81)
RDW: 14.3 % (ref 11.5–15.5)
WBC: 7.4 10*3/uL (ref 4.0–10.5)
nRBC: 0 % (ref 0.0–0.2)

## 2019-06-04 LAB — PSA: Prostatic Specific Antigen: 0.02 ng/mL (ref 0.00–4.00)

## 2019-06-04 MED ORDER — LEVOFLOXACIN 500 MG PO TABS: 500.0000 mg | ORAL_TABLET | Freq: Every day | ORAL | 0 refills | Status: DC

## 2019-06-04 NOTE — Progress Notes (Signed)
Patient stated that he had been doing well with no complaints. 

## 2019-06-07 NOTE — Progress Notes (Signed)
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 He also has a past history of prostate cancer about 15 years ago(Gleasons score 8)and was treated with prostatectomy at that time. He did not receive any radiation treatment and does not remember if his PSA was subsequently monitored. PSA in March 2015 was 7.9 and in 2014 was 6.7 after prostatectomy. He was last seen by medical oncology Dr. Everlene Other in 2015 and at that time his PSA was 9.2 previously was supposed to follow-up after 2 months and ADT was supposed to be started at that time the patient was lost to follow-up.  He was seen by Dr. Golden Pop and his primary care doctor he saw until about 2 years ago. He has had ongoing chronic back pain and has had back surgery in the past but was having worsening back pain over the last few months and was seen by Dr. Raul Del.   MRI of the lumbar spine on 11/22/2016 revealed extensive osseous metastatic disease with pathologic compression fracture of the L3 vertebral body. Posteriorly displace fragments and metastatic soft tissue result in severe canal stenosis and compression of the thecal sac at this level.  CT chest abdomen and pelvis on 11/25/2016 did not reveal any evidence of metastatic disease other than his bones  Patient had decompression surgery of his L3vertebral  body which showed:Metastatic adenocarcinoma with features consistent with primary prostatic origin. See note.  Note: The patient history of previously diagnosed prostate carcinoma is noted. Immunoperoxidase stains were performed on formalin-fixed (non-decalcified) paraffin-embedded tissue in block A3 with appropriate positive controls. The tumor cells stain with CK8/18, PROSTATIC SPECIFIC ANTIGEN, PROSTATIC ACID PHOSPHATASE and P501S. The tumor cells do not stain with CYTOKERATIN 7, CYTOKERATIN 20, CK5/6, P40, GATA-3, CDX-2 or THYROID TRANSCRIPTION FACTOR (TTF-1). These findings are in support of the interpretation of metastatic adenocarcinoma with features consistent with primary prostatic origin.  Patient also has primary hyperparathyroidism and hypercalcemia secondary to it for which he sees Dr.Akhter from [facility] and has been referred to surgery for the same  Abiraterone was started for high risk castrate sensitive prostate cancer on 02/14/2017.Baseline bone density showed osteopenia with a T score of -1.9 at the right femur neck.  Interval history-patient feels well overall and tolerating Zytiga well without any significant side effects.  Denies any specific complaints at this time  ECOG PS- 1 Pain scale- 0   Review of systems- Review of Systems  Constitutional: Negative for chills, fever, malaise/fatigue and weight loss.  HENT: Negative for congestion, ear discharge and nosebleeds.   Eyes: Negative for blurred vision.  Respiratory: Negative for cough, hemoptysis, sputum production, shortness of breath and wheezing.   Cardiovascular: Negative for chest pain, palpitations, orthopnea and claudication.  Gastrointestinal: Negative for abdominal pain, blood in stool, constipation, diarrhea, heartburn, melena, nausea and vomiting.  Genitourinary: Negative for dysuria, flank pain, frequency, hematuria and urgency.  Musculoskeletal: Negative for back pain, joint pain and myalgias.  Skin:  Negative  for rash.  Neurological: Negative for dizziness, tingling, focal weakness, seizures, weakness and headaches.  Endo/Heme/Allergies: Does not bruise/bleed easily.  Psychiatric/Behavioral: Negative for depression and suicidal ideas. The patient does not have insomnia.       No Known Allergies   Past Medical History:  Diagnosis Date  . Arthritis   . Cancer [facility])    Prostate CA approximately 11 years ago  . Hyperlipidemia   . Hypertension   . Myocardial infarction [facility])      Past Surgical History:  Procedure Laterality Date  . APPENDECTOMY    . BACK SURGERY    . CARDIAC CATHETERIZATION     stent x 1  . CHOLECYSTECTOMY    . LUMBAR LAMINECTOMY/DECOMPRESSION MICRODISCECTOMY Left 08/20/2012   Procedure: LUMBAR LAMINECTOMY/DECOMPRESSION MICRODISCECTOMY 1 LEVEL;  Surgeon: Faythe Ghee, MD;  Location: [facility] NEUR[facility];  Service: Neurosurgery;  Laterality: Left;  lumbar four-five  . PROSTATE SURGERY    . TONSILLECTOMY      Social History   Socioeconomic History  . Marital status: Married    Spouse name: Not on file  . Number of children: Not on file  . Years of education: Not on file  . Highest education level: Not on file  Occupational History  . Not on file  Tobacco Use  . Smoking status: Never Smoker  . Smokeless tobacco: Never Used  Substance and Sexual Activity  . Alcohol use: No  . Drug use: No  . Sexual activity: Not on file  Other Topics Concern  . Not on file  Social History Narrative  . Not on file   Social Determinants of Health   Financial Resource Strain:   . Difficulty of Paying Living Expenses: Not on file  Food Insecurity:   . Worried About Charity fundraiser in the Last Year: Not on file  . Ran Out of Food in the Last Year: Not on file  Transportation Needs:   . Lack of Transportation (Medical): Not on file  . Lack of Transportation (Non-Medical): Not on file  Physical Activity:   . Days of Exercise per Week: Not on file  . Minutes of  Exercise per Session: Not on file  Stress:   . Feeling of Stress : Not on file  Social Connections:   . Frequency of Communication with Friends and Family: Not on file  . Frequency of Social Gatherings with Friends and Family: Not on file  . Attends Religious Services: Not on file  . Active Member of Clubs or Organizations: Not on file  . Attends Archivist Meetings: Not on file  . Marital Status: Not on file  Intimate Partner Violence:   . Fear of Current or Ex-Partner: Not on file  . Emotionally Abused: Not on file  . Physically Abused: Not on file  . Sexually Abused: Not on file    Family History  Problem Relation Age of Onset  . Stroke Mother   . Heart attack Father      Current Outpatient Medications:  .  abiraterone acetate (ZYTIGA) 250 MG tablet, TAKE 4 TABLETS (1,000 MG TOTAL) BY MOUTH DAILY. TAKE ON AN EMPTY STOMACH 1 HOUR BEFORE OR 2 HOURS AFTER A MEAL, Disp: 120 tablet, Rfl: 0 .  aspirin EC 81 MG tablet, Take 81 mg by mouth daily., Disp: , Rfl:  .  atorvastatin (LIPITOR) 10 MG tablet, Take 10 mg by mouth daily., Disp: , Rfl:  .  CALCIUM PO, Take 1 tablet by mouth 3 (three)  times a week., Disp: , Rfl:  .  carvedilol (COREG) 25 MG tablet, Take 25 mg by mouth 2 (two) times daily., Disp: , Rfl:  .  cetirizine (ZYRTEC) 10 MG tablet, Take 10 mg by mouth daily., Disp: , Rfl:  .  cholecalciferol (VITAMIN D) 1000 units tablet, Take 2,000 Units by mouth daily., Disp: , Rfl:  .  clopidogrel (PLAVIX) 75 MG tablet, Take 75 mg by mouth daily., Disp: , Rfl:  .  diltiazem (CARDIZEM CD) 120 MG 24 hr capsule, Take by mouth., Disp: , Rfl:  .  furosemide (LASIX) 20 MG tablet, Take 20 mg by mouth daily., Disp: , Rfl: 11 .  hydrALAZINE (APRESOLINE) 25 MG tablet, Take 25 mg by mouth 3 (three) times daily., Disp: , Rfl:  .  lisinopril (PRINIVIL,ZESTRIL) 40 MG tablet, Take 40 mg by mouth daily., Disp: , Rfl: 3 .  metoprolol tartrate (LOPRESSOR) 25 MG tablet, Take 50 mg by mouth 2  (two) times daily. , Disp: , Rfl:  .  predniSONE (DELTASONE) 5 MG tablet, Take 1 tablet by mouth once daily with breakfast, Disp: 90 tablet, Rfl: 0 .  levofloxacin (LEVAQUIN) 500 MG tablet, Take 1 tablet (500 mg total) by mouth daily., Disp: 10 tablet, Rfl: 0 .  nitroGLYCERIN (NITROSTAT) 0.4 MG SL tablet, As directed, Disp: , Rfl:   Physical exam:  Vitals:   06/04/19 1106  Temp: 97.9 F (36.6 C)  TempSrc: Tympanic  Weight: 197 lb (89.4 kg)   Physical Exam HENT:     Head: Normocephalic and atraumatic.  Eyes:     Pupils: Pupils are equal, round, and reactive to light.  Cardiovascular:     Rate and Rhythm: Normal rate and regular rhythm.     Heart sounds: Normal heart sounds.  Pulmonary:     Effort: Pulmonary effort is normal.     Breath sounds: Normal breath sounds.  Abdominal:     General: Bowel sounds are normal.     Palpations: Abdomen is soft.  Musculoskeletal:     Cervical back: Normal range of motion.     Right lower leg: No edema.     Left lower leg: No edema.  Skin:    General: Skin is warm and dry.  Neurological:     Mental Status: He is alert and oriented to person, place, and time.      CMP Latest Ref Rng & Units 06/04/2019  Glucose 70 - 99 mg/dL 115(H)  BUN 8 - 23 mg/dL 19  Creatinine 0.61 - 1.24 mg/dL 1.10  Sodium 135 - 145 mmol/L 137  Potassium 3.5 - 5.1 mmol/L 4.0  Chloride 98 - 111 mmol/L 102  CO2 22 - 32 mmol/L 27  Calcium 8.9 - 10.3 mg/dL 10.7(H)  Total Protein 6.5 - 8.1 g/dL 6.6  Total Bilirubin 0.3 - 1.2 mg/dL 0.7  Alkaline Phos 38 - 126 U/L 64  AST 15 - 41 U/L 13(L)  ALT 0 - 44 U/L 9   CBC Latest Ref Rng & Units 06/04/2019  WBC 4.0 - 10.5 K/uL 7.4  Hemoglobin 13.0 - 17.0 g/dL 11.3(L)  Hematocrit 39.0 - 52.0 % 36.6(L)  Platelets 150 - 400 K/uL 230    No images are attached to the encounter.  CT Chest W Contrast  Result Date: 05/31/2019 CLINICAL DATA:  85 year old male with history of prostate cancer with metastatic disease to the bones.  Followup study. EXAM: CT CHEST, ABDOMEN, AND PELVIS WITH CONTRAST TECHNIQUE: Multidetector CT imaging of the chest, abdomen and  pelvis was performed following the standard protocol during bolus administration of intravenous contrast. CONTRAST:  185mL OMNIPAQUE IOHEXOL 300 MG/ML  SOLN COMPARISON:  CT the chest, abdomen and pelvis 06/04/2018. FINDINGS: CT CHEST FINDINGS Cardiovascular: Heart size is normal. There is no significant pericardial fluid, thickening or pericardial calcification. There is aortic atherosclerosis, as well as atherosclerosis of the great vessels of the mediastinum and the coronary arteries, including calcified atherosclerotic plaque in the left main, left anterior descending, left circumflex and right coronary arteries. Mediastinum/Nodes: No pathologically enlarged mediastinal or hilar lymph nodes. At the level of the thoracic inlet (axial image 8 of series 50 for and sagittal image 103 of series 507) there is a well-defined 1.9 x 1.2 x 2.6 cm high attenuation smoothly marginated lesion associated with the posterior wall of the esophagus, stable in retrospect compared to the prior examination. Esophagus is otherwise unremarkable in appearance. No axillary lymphadenopathy. Lungs/Pleura: In the posterior aspect of the left lower lobe (axial image 137 of series 505 and sagittal image 132 of series 507) there is a new elongated mass measuring 2.5 x 2.2 x 4.4 cm. No other suspicious pulmonary nodules or masses are noted. No pleural effusions. Musculoskeletal: Numerous sclerotic lesions noted throughout the visualized axial and appendicular skeleton, similar to the prior examination, compatible with widespread metastatic disease to the bones. CT ABDOMEN PELVIS FINDINGS Hepatobiliary: Several subcentimeter low-attenuation lesions scattered throughout the liver, stable in number and size compared to prior examinations, nonspecific, but favored to be benign cysts. 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 No other new suspicious hepatic lesions. No intra or extrahepatic biliary ductal dilatation. Status post cholecystectomy. Pancreas: No pancreatic mass. No pancreatic ductal dilatation. No pancreatic or peripancreatic fluid collections or inflammatory changes. Spleen: Unremarkable. Adrenals/Urinary Tract: Multiple low-attenuation lesions in both kidneys, compatible with simple cysts, largest of which is in the upper pole the right kidney measuring 5.5 cm in diameter. Other subcentimeter low-attenuation lesions in both kidneys, too small to characterize, but statistically likely to represent tiny cysts. Bilateral adrenal glands are normal in appearance. No hydroureteronephrosis. Urinary bladder is normal in appearance. Stomach/Bowel: Normal appearance of the stomach. 3.8 cm periampullary duodenal diverticulum without surrounding inflammatory changes. Numerous colonic diverticulae are noted, particularly in the sigmoid colon, without surrounding inflammatory changes to suggest an acute diverticulitis at this time. The appendix is not confidently identified and may be surgically absent. Regardless, there are no inflammatory changes noted adjacent to the cecum to suggest the presence of an acute appendicitis at this time. Vascular/Lymphatic: Aortic atherosclerosis, without evidence of aneurysm or dissection in the abdominal or pelvic vasculature. Retroaortic left renal vein (normal anatomical variant) incidentally noted. Several prominent borderline enlarged pelvic lymph nodes are noted, largest of which measures 9 mm in short axis in the left external iliac nodal station (axial image 112 of series 504). No definite pathologically enlarged lymph nodes are noted elsewhere in the abdomen or pelvis. Reproductive: Status post radical prostatectomy. Other: No significant  volume of ascites.  No pneumoperitoneum. Musculoskeletal: Status post PLIF from L1-L5 with vertebral body implant at L3 numerous sclerotic lesions are again noted throughout the visualized axial and appendicular skeleton, compatible with widespread metastatic disease to the bones, largest of which is in the left ilium (axial image 101 of series 504) measuring 4.1 x 1.4 cm. IMPRESSION: 1. New left lower lobe mass-like  lesion concerning for potential metastatic lesion. However, the possibility of an area of evolving post infectious or inflammatory scarring is not entirely excluded. 2. Widespread metastatic disease to the bones appears very similar to the prior examination. 3. Multiple prominent borderline enlarged left pelvic lymph nodes minimally increased compared to the prior examination. Continued attention on follow-up studies is recommended. 4. High attenuation lesion at the level of the thoracic inlet posterior to the proximal esophagus, potentially inspissated secretions within a Zenker's diverticulum. Regardless, this lesion is stable in size in retrospect compared to the prior examination, and is favored to be benign. 5. Colonic diverticulosis without evidence of acute diverticulitis at this time. 6. Aortic atherosclerosis, in addition to left main and 3 vessel coronary artery disease. 7. Additional incidental findings, as above. Electronically Signed   By: Vinnie Langton M.D.   On: 05/31/2019 13:31   NM Bone Scan Whole Body  Result Date: 06/01/2019 CLINICAL DATA:  Metastatic prostate cancer. EXAM: NUCLEAR MEDICINE WHOLE BODY BONE SCAN TECHNIQUE: Whole body anterior and posterior images were obtained approximately 3 hours after intravenous injection of radiopharmaceutical. RADIOPHARMACEUTICALS:  23.2 mCi Technetium-2m MDP IV COMPARISON:  CT chest, abdomen, pelvis 05/31/2019. FINDINGS: Bilateral renal function excretion. Multifocal areas of increased activity noted about the cervical, thoracic, and  lumbar spine. Focal areas of increased activity noted about the sacrum. Very subtle focal areas of increased activity noted about the ribs bilaterally. Punctate area of increased activity noted about the proximal right humerus. These findings are consistent with known metastatic disease. Punctate area of increased activity noted about the left face, this could be related to sinus disease. Focal metastatic lesion cannot be excluded. Increased activity noted about both knees medially and about the right first MTP joint. These changes are most likely degenerative. IMPRESSION: 1. Multifocal areas of increased activity noted about the cervical, thoracic, lumbar spine. Focal areas of increased activity noted about the sacrum. Very subtle focal areas of increased activity noted about the ribs bilaterally. Punctate area of increased activity about the proximal right humerus. These findings are consistent with known metastatic disease. 2. Punctate increased activity noted about the left face, this could be related to sinus disease. Focal metastatic lesion cannot be excluded. 3. Increased activity noted about the both and knees medially and about the right first MTP joint. These changes are most likely degenerative. Electronically Signed   By: Marcello Moores  Register   On: 06/01/2019 05:52   CT ABDOMEN PELVIS W CONTRAST  Result Date: 05/31/2019 CLINICAL DATA:  85 year old male with history of prostate cancer with metastatic disease to the bones. Followup study. EXAM: CT CHEST, ABDOMEN, AND PELVIS WITH CONTRAST TECHNIQUE: Multidetector CT imaging of the chest, abdomen and pelvis was performed following the standard protocol during bolus administration of intravenous contrast. CONTRAST:  167mL OMNIPAQUE IOHEXOL 300 MG/ML  SOLN COMPARISON:  CT the chest, abdomen and pelvis 06/04/2018. FINDINGS: CT CHEST FINDINGS Cardiovascular: Heart size is normal. There is no significant pericardial fluid, thickening or pericardial calcification.  There is aortic atherosclerosis, as well as atherosclerosis of the great vessels of the mediastinum and the coronary arteries, including calcified atherosclerotic plaque in the left main, left anterior descending, left circumflex and right coronary arteries. Mediastinum/Nodes: No pathologically enlarged mediastinal or hilar lymph nodes. At the level of the thoracic inlet (axial image 8 of series 50 for and sagittal image 103 of series 507) there is a well-defined 1.9 x 1.2 x 2.6 cm high attenuation smoothly marginated lesion associated with the posterior wall of  the esophagus, stable in retrospect compared to the prior examination. Esophagus is otherwise unremarkable in appearance. No axillary lymphadenopathy. Lungs/Pleura: In the posterior aspect of the left lower lobe (axial image 137 of series 505 and sagittal image 132 of series 507) there is a new elongated mass measuring 2.5 x 2.2 x 4.4 cm. No other suspicious pulmonary nodules or masses are noted. No pleural effusions. Musculoskeletal: Numerous sclerotic lesions noted throughout the visualized axial and appendicular skeleton, similar to the prior examination, compatible with widespread metastatic disease to the bones. CT ABDOMEN PELVIS FINDINGS Hepatobiliary: Several subcentimeter low-attenuation lesions scattered throughout the liver, stable in number and size compared to prior examinations, nonspecific, but favored to be benign cysts. 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 No other new suspicious hepatic lesions. No intra or extrahepatic biliary ductal dilatation. Status post cholecystectomy. Pancreas: No pancreatic mass. No pancreatic ductal dilatation. No pancreatic or peripancreatic fluid collections or inflammatory changes. Spleen: Unremarkable. Adrenals/Urinary Tract: Multiple low-attenuation lesions  in both kidneys, compatible with simple cysts, largest of which is in the upper pole the right kidney measuring 5.5 cm in diameter. Other subcentimeter low-attenuation lesions in both kidneys, too small to characterize, but statistically likely to represent tiny cysts. Bilateral adrenal glands are normal in appearance. No hydroureteronephrosis. Urinary bladder is normal in appearance. Stomach/Bowel: Normal appearance of the stomach. 3.8 cm periampullary duodenal diverticulum without surrounding inflammatory changes. Numerous colonic diverticulae are noted, particularly in the sigmoid colon, without surrounding inflammatory changes to suggest an acute diverticulitis at this time. The appendix is not confidently identified and may be surgically absent. Regardless, there are no inflammatory changes noted adjacent to the cecum to suggest the presence of an acute appendicitis at this time. Vascular/Lymphatic: Aortic atherosclerosis, without evidence of aneurysm or dissection in the abdominal or pelvic vasculature. Retroaortic left renal vein (normal anatomical variant) incidentally noted. Several prominent borderline enlarged pelvic lymph nodes are noted, largest of which measures 9 mm in short axis in the left external iliac nodal station (axial image 112 of series 504). No definite pathologically enlarged lymph nodes are noted elsewhere in the abdomen or pelvis. Reproductive: Status post radical prostatectomy. Other: No significant volume of ascites.  No pneumoperitoneum. Musculoskeletal: Status post PLIF from L1-L5 with vertebral body implant at L3 numerous sclerotic lesions are again noted throughout the visualized axial and appendicular skeleton, compatible with widespread metastatic disease to the bones, largest of which is in the left ilium (axial image 101 of series 504) measuring 4.1 x 1.4 cm. IMPRESSION: 1. New left lower lobe mass-like lesion concerning for potential metastatic lesion. However, the possibility  of an area of evolving post infectious or inflammatory scarring is not entirely excluded. 2. Widespread metastatic disease to the bones appears very similar to the prior examination. 3. Multiple prominent borderline enlarged left pelvic lymph nodes minimally increased compared to the prior examination. Continued attention on follow-up studies is recommended. 4. High attenuation lesion at the level of the thoracic inlet posterior to the proximal esophagus, potentially inspissated secretions within a Zenker's diverticulum. Regardless, this lesion is stable in size in retrospect compared to the prior examination, and is favored to be benign. 5. Colonic diverticulosis without evidence of acute diverticulitis at this time. 6. Aortic atherosclerosis, in addition to left main and 3 vessel coronary artery disease. 7. Additional incidental findings, as above. Electronically Signed  By: Vinnie Langton M.D.   On: 05/31/2019 13:31     Assessment and plan- Patient is a 85 y.o. male with castrate sensitive metastatic prostate cancer on Zytiga here for routine follow-up and to discuss CT scan results  I have reviewed CT chest abdomen pelvis images independently as well as bone scanAnd discussed the results with the patient.  Bone scan shows stable metastatic disease in his bones.  PSA remains low at 0.02.  CT chest however shows a new left lower lobe mass which appears more conspicuous as compared to his prior CT scan.  Unclear if this represents an atypical infection versus malignancy.  Patient is a lifetime non-smoker.  We will review his images at tumor board and plan was to proceed with empiric course of antibiotics to see if that would help resolve the infection followed by a short-term CT scan which I will plan to get in 8 to 12 weeks time.  Patient does not report any new respiratory symptoms at this time.  Patient will continue to take Zytiga. He receives Lupron every 6 months along with Zometa every 6 months  for osteopenia and he will be due for his next dose in July 2021.  I will see him back in 2 months time with CBC with differential, CMP and PSA and he will need a CT chest with contrast prior to follow-up on the lung mass    Visit Diagnosis 1. Prostate cancer metastatic to bone ([place])   2. High risk medication use   3. Abnormal CT of the chest      Dr. Randa Evens, MD, MPH [facility] at [facility] [id number] 06/07/2019 11:27 AM

## 2019-06-21 ENCOUNTER — Other Ambulatory Visit: Payer: Self-pay | Admitting: Oncology

## 2019-06-21 DIAGNOSIS — C61 Malignant neoplasm of prostate: Secondary | ICD-10-CM

## 2019-06-25 MED FILL — ABIRATERONE ACETATE 250 MG: 250 | 30 days supply | Qty: 120 | Fill #0

## 2019-07-05 ENCOUNTER — Other Ambulatory Visit: Payer: Self-pay | Admitting: Oncology

## 2019-07-21 ENCOUNTER — Other Ambulatory Visit: Payer: Self-pay | Admitting: Oncology

## 2019-07-21 DIAGNOSIS — C7951 Secondary malignant neoplasm of bone: Secondary | ICD-10-CM

## 2019-07-21 DIAGNOSIS — C61 Malignant neoplasm of prostate: Secondary | ICD-10-CM

## 2019-07-21 NOTE — Telephone Encounter (Signed)
CBC diff arch Order: [id number]  Status: Final result Visible to patient: Yes (MyChart) Next appt: 08/04/2019 at 09:00 AM in Oncology ([facility]) Dx: Prostate cancer metastatic to bone ([place])    Ref Range & Units 1 mo ago 3 mo ago 9 mo ago  WBC 4.0 - 10.5 K/uL 7.4  8.0  6.9   RBC 4.22 - 5.81 MIL/uL 4.05 4.17 4.23   Hemoglobin 13.0 - 17.0 g/dL 11.3 11.9 12.1  HCT 39.0 - 52.0 % 36.6 37.0 37.6  MCV 80.0 - 100.0 fL 90.4  88.7  88.9   MCH 26.0 - 34.0 pg 27.9  28.5  28.6   MCHC 30.0 - 36.0 g/dL 30.9  32.2  32.2   RDW 11.5 - 15.5 % 14.3  14.1  14.0   Platelets 150 - 400 K/uL 230  232  244   nRBC 0.0 - 0.2 % 0.0  0.0  0.0   Neutrophils Relative % % 70  73  75   Neutro Abs 1.7 - 7.7 K/uL 5.2  5.8  5.1   Lymphocytes Relative % 20  19  17    Lymphs Abs 0.7 - 4.0 K/uL 1.5  1.5  1.2   Monocytes Relative % 7  7  6    Monocytes Absolute 0.1 - 1.0 K/uL 0.5  0.5  0.4   Eosinophils Relative % 2  1  1    Eosinophils Absolute 0.0 - 0.5 K/uL 0.2  0.1  0.1   Basophils Relative % 1  0  1   Basophils Absolute 0.0 - 0.1 K/uL 0.0  0.0  0.1   Immature Granulocytes % 0  0  0   Abs Immature Granulocytes 0.00 - 0.07 K/uL 0.03  0.03 CM  0.02 CM   Comment: Performed at [facility], [address] [place], [place] [address]  Resulting Agency  [facility] CLIN LAB [facility] CLIN LAB [facility] CLIN LAB     Specimen Collected: 06/04/19 10:43 Last Resulted: 06/04/19 10:56  Lab Flowsheet  Order Details  View Encounter  Lab and Collection Details  Routing  Result History   CM=Additional comments    Other Results from 06/04/2019 CMP arch  Status: Final result Visible to patient: Yes (MyChart) Next appt: 08/04/2019 at 09:00 AM in Oncology ([facility]) Dx: Prostate cancer metastatic to bone [facility])  Order: [id number]    Ref Range & Units 1 mo ago  (06/04/19) 1 mo ago  (05/31/19) 3 mo ago  (04/06/19)  Sodium 135 - 145 mmol/L 137   139   Potassium 3.5 - 5.1 mmol/L 4.0   4.3   Chloride 98 - 111 mmol/L 102   103   CO2 22 - 32 mmol/L 27    30   Glucose, Bld 70 - 99 mg/dL 115  111  Comment: Glucose reference range applies only to samples taken after fasting for at least 8 hours.  BUN 8 - 23 mg/dL 19   18   Creatinine, Ser 0.61 - 1.24 mg/dL 1.10  1.00  1.03   Calcium 8.9 - 10.3 mg/dL 10.7  11.4  Total Protein 6.5 - 8.1 g/dL 6.6   7.0   Albumin 3.5 - 5.0 g/dL 3.7   4.0   AST 15 - 41 U/L 13  14  ALT 0 - 44 U/L 9   11   Alkaline Phosphatase 38 - 126 U/L 64   70   Total Bilirubin 0.3 - 1.2 mg/dL 0.7   0.5   GFR calc non Af  Amer >60 mL/min >60   60 mL/min" class="[id number]">60   GFR calc Af Amer >60 mL/min >60   60 mL/min" class="[id number]">60   Anion gap 5 - 15 8   6  CM   Comment: Performed at [facility], [address] [place], [place] [address]  Resulting Agency  [facility] CLIN LAB [place] CLIN LAB [facility] CLIN LAB     Specimen Collected: 06/04/19 10:43 Last Resulted: 06/04/19 11:05  Lab Flowsheet  Order Details  View Encounter  Lab and Collection Details  Routing  Result History   CM=Additional comments       PSA  Status: Final result Visible to patient: Yes (MyChart) Next appt: 08/04/2019 at 09:00 AM in Oncology ([facility]) Dx: Prostate cancer metastatic to bone [facility])  Order: [id number]    Ref Range & Units 1 mo ago 3 mo ago 9 mo ago  Prostatic Specific Antigen 0.00 - 4.00 ng/mL 0.02  0.02 CM  0.06 CM   Comment: (NOTE)  While PSA levels of <=4.0 ng/ml are reported as reference range, some  men with levels below 4.0 ng/ml can have prostate cancer and many men  with PSA above 4.0 ng/ml do not have prostate cancer. Other tests  such as free PSA, age specific reference ranges, PSA velocity and PSA  doubling time may be helpful especially in men less than 44 years  old.  Performed at [facility], [place] [address], [place], [place]  [address]    Resulting Agency  [facility] CLIN LAB [facility] CLIN LAB [facility] CLIN LAB     Specimen Collected: 06/04/19 10:42 Last Resulted: 06/04/19 16:39

## 2019-07-28 MED FILL — ABIRATERONE ACETATE 250 MG: 250 | 30 days supply | Qty: 120 | Fill #0

## 2019-08-04 ENCOUNTER — Ambulatory Visit
Admission: RE | Admit: 2019-08-04 | Discharge: 2019-08-04 | Disposition: A | Discharge status: Home / Self Care | Payer: Medicare Other | Source: Ambulatory Visit | Attending: Oncology | Admitting: Oncology

## 2019-08-04 ENCOUNTER — Other Ambulatory Visit: Payer: Self-pay

## 2019-08-04 ENCOUNTER — Inpatient Hospital Stay: Payer: Medicare Other | Attending: Oncology

## 2019-08-04 DIAGNOSIS — E785 Hyperlipidemia, unspecified: Secondary | ICD-10-CM | POA: Insufficient documentation

## 2019-08-04 DIAGNOSIS — C7951 Secondary malignant neoplasm of bone: Secondary | ICD-10-CM | POA: Diagnosis present

## 2019-08-04 DIAGNOSIS — I252 Old myocardial infarction: Secondary | ICD-10-CM | POA: Diagnosis not present

## 2019-08-04 DIAGNOSIS — Z79899 Other long term (current) drug therapy: Secondary | ICD-10-CM | POA: Insufficient documentation

## 2019-08-04 DIAGNOSIS — I2510 Atherosclerotic heart disease of native coronary artery without angina pectoris: Secondary | ICD-10-CM | POA: Diagnosis not present

## 2019-08-04 DIAGNOSIS — R918 Other nonspecific abnormal finding of lung field: Secondary | ICD-10-CM | POA: Diagnosis not present

## 2019-08-04 DIAGNOSIS — N179 Acute kidney failure, unspecified: Secondary | ICD-10-CM | POA: Insufficient documentation

## 2019-08-04 DIAGNOSIS — I10 Essential (primary) hypertension: Secondary | ICD-10-CM | POA: Insufficient documentation

## 2019-08-04 DIAGNOSIS — Z7982 Long term (current) use of aspirin: Secondary | ICD-10-CM | POA: Insufficient documentation

## 2019-08-04 DIAGNOSIS — C61 Malignant neoplasm of prostate: Secondary | ICD-10-CM | POA: Insufficient documentation

## 2019-08-04 DIAGNOSIS — Z7952 Long term (current) use of systemic steroids: Secondary | ICD-10-CM | POA: Insufficient documentation

## 2019-08-04 HISTORY — DX: Malignant neoplasm of prostate: C61

## 2019-08-04 LAB — COMPREHENSIVE METABOLIC PANEL
ALT: 13 U/L (ref 0–44)
AST: 17 U/L (ref 15–41)
Albumin: 3.9 g/dL (ref 3.5–5.0)
Alkaline Phosphatase: 68 U/L (ref 38–126)
Anion gap: 10 (ref 5–15)
BUN: 24 mg/dL — ABNORMAL HIGH (ref 8–23)
CO2: 28 mmol/L (ref 22–32)
Calcium: 10.6 mg/dL — ABNORMAL HIGH (ref 8.9–10.3)
Chloride: 102 mmol/L (ref 98–111)
Creatinine, Ser: 1.41 mg/dL — ABNORMAL HIGH (ref 0.61–1.24)
GFR calc Af Amer: 53 mL/min — ABNORMAL LOW (ref >60)
GFR calc non Af Amer: 46 mL/min — ABNORMAL LOW (ref >60)
Glucose, Bld: 110 mg/dL — ABNORMAL HIGH (ref 70–99)
Potassium: 4.7 mmol/L (ref 3.5–5.1)
Sodium: 140 mmol/L (ref 135–145)
Total Bilirubin: 0.9 mg/dL (ref 0.3–1.2)
Total Protein: 6.9 g/dL (ref 6.5–8.1)

## 2019-08-04 LAB — CBC WITH DIFFERENTIAL/PLATELET
Abs Immature Granulocytes: 0.03 10*3/uL (ref 0.00–0.07)
Basophils Absolute: 0 10*3/uL (ref 0.0–0.1)
Basophils Relative: 1 %
Eosinophils Absolute: 0.2 10*3/uL (ref 0.0–0.5)
Eosinophils Relative: 2 %
HCT: 36.9 % — ABNORMAL LOW (ref 39.0–52.0)
Hemoglobin: 11.8 g/dL — ABNORMAL LOW (ref 13.0–17.0)
Immature Granulocytes: 0 %
Lymphocytes Relative: 32 %
Lymphs Abs: 2.2 10*3/uL (ref 0.7–4.0)
MCH: 28.4 pg (ref 26.0–34.0)
MCHC: 32 g/dL (ref 30.0–36.0)
MCV: 88.7 fL (ref 80.0–100.0)
Monocytes Absolute: 0.5 10*3/uL (ref 0.1–1.0)
Monocytes Relative: 8 %
Neutro Abs: 3.9 10*3/uL (ref 1.7–7.7)
Neutrophils Relative %: 57 %
Platelets: 240 10*3/uL (ref 150–400)
RBC: 4.16 MIL/uL — ABNORMAL LOW (ref 4.22–5.81)
RDW: 15.1 % (ref 11.5–15.5)
WBC: 6.8 10*3/uL (ref 4.0–10.5)
nRBC: 0 % (ref 0.0–0.2)

## 2019-08-04 LAB — PSA: Prostatic Specific Antigen: 0.03 ng/mL (ref 0.00–4.00)

## 2019-08-04 MED ORDER — IOHEXOL 300 MG/ML  SOLN
60.0000 mL | Freq: Once | INTRAMUSCULAR | Status: AC | PRN
  Administered 2019-08-04: 60 mL via INTRAVENOUS

## 2019-08-05 ENCOUNTER — Inpatient Hospital Stay: Payer: Medicare Other

## 2019-08-06 ENCOUNTER — Other Ambulatory Visit: Payer: Self-pay

## 2019-08-06 ENCOUNTER — Inpatient Hospital Stay (HOSPITAL_BASED_OUTPATIENT_CLINIC_OR_DEPARTMENT_OTHER): Payer: Medicare Other | Admitting: Oncology

## 2019-08-06 DIAGNOSIS — N179 Acute kidney failure, unspecified: Secondary | ICD-10-CM | POA: Diagnosis not present

## 2019-08-06 DIAGNOSIS — C7951 Secondary malignant neoplasm of bone: Secondary | ICD-10-CM

## 2019-08-06 DIAGNOSIS — R911 Solitary pulmonary nodule: Secondary | ICD-10-CM

## 2019-08-06 DIAGNOSIS — C61 Malignant neoplasm of prostate: Secondary | ICD-10-CM

## 2019-08-10 NOTE — Progress Notes (Signed)
I connected with Carlos Perkins on 08/10/19 at  1:00 PM EDT by video enabled telemedicine visit and verified that I am speaking with the correct person using two identifiers.   I discussed the limitations, risks, security and privacy concerns of performing an evaluation and management service by telemedicine and the availability of in-person appointments. I also discussed with the patient that there may be a patient responsible charge related to this service. The patient expressed understanding and agreed to proceed.  Other persons participating in the visit and their role in the encounter:  none  Patient's location:  home Provider's location:  work  Risk analyst Complaint:  Routine f/u of prostate cancer  History of present illness: patient is a 85 year old gentleman with a past medical history significant for chronic low back pain due to lumbar radiculopathy, coronary artery disease. He also has a past history of prostate cancer about 15 years ago(Gleasons score 8)and was treated with prostatectomy at that time. He did not receive any radiation treatment and does not remember if his PSA was subsequently monitored. PSA in March 2015 was 7.9 and in 2014 was 6.7 after prostatectomy. He was last seen by medical oncology Dr. Everlene Other in 2015 and at that time his PSA was 9.2 previously was supposed to follow-up after 2 months and ADT was supposed to be started at that time the patient was lost to follow-up.  He was seen by Dr. Golden Pop and his primary care doctor he saw until about 2 years ago. He has had ongoing chronic back pain and has had back surgery in the past but was having worsening back pain over the last few months and was seen by Dr. Raul Del.   MRI of the lumbar spine on 11/22/2016 revealed extensive osseous metastatic disease with pathologic compression fracture of the L3 vertebral body. Posteriorly displace fragments and metastatic soft tissue result in severe canal stenosis and compression  of the thecal sac at this level.  CT chest abdomen and pelvis on 11/25/2016 did not reveal any evidence of metastatic disease other than his bones  Patient had decompression surgery of his L3vertebral body which showed:Metastatic adenocarcinoma with features consistent with primary prostatic origin. See note.  Note: The patient history of previously diagnosed prostate carcinoma is noted. Immunoperoxidase stains were performed on formalin-fixed (non-decalcified) paraffin-embedded tissue in block A3 with appropriate positive controls. The tumor cells stain with CK8/18, PROSTATIC SPECIFIC ANTIGEN, PROSTATIC ACID PHOSPHATASE and P501S. The tumor cells do not stain with CYTOKERATIN 7, CYTOKERATIN 20, CK5/6, P40, GATA-3, CDX-2 or THYROID TRANSCRIPTION FACTOR (TTF-1). These findings are in support of the interpretation of metastatic adenocarcinoma with features consistent with primary prostatic origin.  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  Interval history patient currently reports doing well and denies any complaints at this time.  He has mild baseline fatigue but no other complaints.  Denies any cough or shortness of breath   Review of Systems  Constitutional: Negative for chills, fever, malaise/fatigue and weight loss.  HENT: Negative for congestion, ear discharge and nosebleeds.   Eyes: Negative for blurred vision.  Respiratory: Negative for cough, hemoptysis, sputum production, shortness of breath and wheezing.   Cardiovascular: Negative for chest pain, palpitations, orthopnea and claudication.  Gastrointestinal: Negative for abdominal pain, blood in stool, constipation, diarrhea, heartburn,  melena, nausea and vomiting.  Genitourinary: Negative for dysuria, flank pain, frequency, hematuria and urgency.  Musculoskeletal: Negative for back pain, joint pain and myalgias.  Skin: Negative for rash.  Neurological: Negative for dizziness, tingling, focal weakness, seizures, weakness and headaches.  Endo/Heme/Allergies: Does not bruise/bleed easily.  Psychiatric/Behavioral: Negative for depression and suicidal ideas. The patient does not have insomnia.     No Known Allergies  Past Medical History:  Diagnosis Date  . Arthritis   . Hyperlipidemia   . Hypertension   . Myocardial infarction ([facility])   . Prostate cancer ([place])    Prostate CA approximately 11 years ago    Past Surgical History:  Procedure Laterality Date  . APPENDECTOMY    . BACK SURGERY    . CARDIAC CATHETERIZATION     stent x 1  . CHOLECYSTECTOMY    . LUMBAR LAMINECTOMY/DECOMPRESSION MICRODISCECTOMY Left 08/20/2012   Procedure: LUMBAR LAMINECTOMY/DECOMPRESSION MICRODISCECTOMY 1 LEVEL;  Surgeon: Faythe Ghee, MD;  Location: [facility];  Service: Neurosurgery;  Laterality: Left;  lumbar four-five  . PROSTATE SURGERY    . TONSILLECTOMY      Social History   Socioeconomic History  . Marital status: Married    Spouse name: Not on file  . Number of children: Not on file  . Years of education: Not on file  . Highest education level: Not on file  Occupational History  . Not on file  Tobacco Use  . Smoking status: Never Smoker  . Smokeless tobacco: Never Used  Substance and Sexual Activity  . Alcohol use: No  . Drug use: No  . Sexual activity: Not on file  Other Topics Concern  . Not on file  Social History Narrative  . Not on file   Social Determinants of Health   Financial Resource Strain:   . Difficulty of Paying Living Expenses:   Food Insecurity:   . Worried About Charity fundraiser in the Last Year:   . Arboriculturist in the Last Year:   Transportation Needs:   . Film/video editor  (Medical):   Marland Kitchen Lack of Transportation (Non-Medical):   Physical Activity:   . Days of Exercise per Week:   . Minutes of Exercise per Session:   Stress:   . Feeling of Stress :   Social Connections:   . Frequency of Communication with Friends and Family:   . Frequency of Social Gatherings with Friends and Family:   . Attends Religious Services:   . Active Member of Clubs or Organizations:   . Attends Archivist Meetings:   Marland Kitchen Marital Status:   Intimate Partner Violence:   . Fear of Current or Ex-Partner:   . Emotionally Abused:   Marland Kitchen Physically Abused:   . Sexually Abused:     Family History  Problem Relation Age of Onset  . Stroke Mother   . Heart attack Father      Current Outpatient Medications:  .  abiraterone acetate (ZYTIGA) 250 MG tablet, TAKE 4 TABLETS (1,000 MG TOTAL) BY MOUTH DAILY. TAKE ON AN EMPTY STOMACH 1 HOUR BEFORE OR 2 HOURS AFTER A MEAL, Disp: 120 tablet, Rfl: 0 .  aspirin EC 81 MG tablet, Take 81 mg by mouth daily., Disp: , Rfl:  .  atorvastatin (LIPITOR) 10 MG tablet, Take 10 mg by mouth daily., Disp: , Rfl:  .  CALCIUM PO, Take 1 tablet by mouth 3 (  three) times a week., Disp: , Rfl:  .  carvedilol (COREG) 25 MG tablet, Take 25 mg by mouth 2 (two) times daily., Disp: , Rfl:  .  cetirizine (ZYRTEC) 10 MG tablet, Take 10 mg by mouth daily., Disp: , Rfl:  .  cholecalciferol (VITAMIN D) 1000 units tablet, Take 2,000 Units by mouth daily., Disp: , Rfl:  .  clopidogrel (PLAVIX) 75 MG tablet, Take 75 mg by mouth daily., Disp: , Rfl:  .  diltiazem (CARDIZEM CD) 120 MG 24 hr capsule, Take by mouth., Disp: , Rfl:  .  furosemide (LASIX) 20 MG tablet, Take 20 mg by mouth daily., Disp: , Rfl: 11 .  hydrALAZINE (APRESOLINE) 25 MG tablet, Take 25 mg by mouth 3 (three) times daily., Disp: , Rfl:  .  levofloxacin (LEVAQUIN) 500 MG tablet, Take 1 tablet (500 mg total) by mouth daily., Disp: 10 tablet, Rfl: 0 .  lisinopril (PRINIVIL,ZESTRIL) 40 MG tablet, Take 40 mg by  mouth daily., Disp: , Rfl: 3 .  metoprolol tartrate (LOPRESSOR) 25 MG tablet, Take 50 mg by mouth 2 (two) times daily. , Disp: , Rfl:  .  nitroGLYCERIN (NITROSTAT) 0.4 MG SL tablet, As directed, Disp: , Rfl:  .  predniSONE (DELTASONE) 5 MG tablet, Take 1 tablet by mouth once daily with breakfast, Disp: 90 tablet, Rfl: 0  CT Chest W Contrast  Result Date: 08/04/2019 CLINICAL DATA:  Follow-up lung mass. EXAM: CT CHEST WITH CONTRAST TECHNIQUE: Multidetector CT imaging of the chest was performed during intravenous contrast administration. CONTRAST:  52mL OMNIPAQUE IOHEXOL 300 MG/ML  SOLN COMPARISON:  05/31/2019 FINDINGS: Cardiovascular: The heart size appears normal. Aortic atherosclerosis. Three vessel coronary artery atherosclerotic calcifications identified. Mediastinum/Nodes: Normal appearance of the thyroid gland. The trachea appears patent and is midline. Normal appearance of the esophagus. No enlarged supraclavicular, axillary, mediastinal or hilar lymph nodes.Unchanged appearance of 1.7 cm high attenuation structure posterior to the esophagus at the level of the thoracic inlet. Lungs/Pleura: No pleural effusion identified. The posterior left lung base lesion measures 1.5 x 1.2 cm, image 137/3. This is compared with 2.2 x 2.2 cm previously. No new lung nodules identified. Upper Abdomen: No acute abnormality. Similar appearance of scattered low-density liver lesions which are nonspecific but favored to represent benign cysts. Right kidney cyst is again noted. Musculoskeletal: Multifocal sclerotic bone metastases are identified throughout the visualized axial and appendicular skeleton. The appearance is unchanged from the previous exam. IMPRESSION: 1. Interval decrease in size of left lower lobe lung nodule. In the absence of interval therapy this favors a benign process inflammatory or infectious process. 2. No new lung nodules. 3. Similar appearance of widespread sclerotic bone metastases. Electronically  Signed   By: Kerby Moors M.D.   On: 08/04/2019 13:06    No images are attached to the encounter.   CMP Latest Ref Rng & Units 08/04/2019  Glucose 70 - 99 mg/dL 110(H)  BUN 8 - 23 mg/dL 24(H)  Creatinine 0.61 - 1.24 mg/dL 1.41(H)  Sodium 135 - 145 mmol/L 140  Potassium 3.5 - 5.1 mmol/L 4.7  Chloride 98 - 111 mmol/L 102  CO2 22 - 32 mmol/L 28  Calcium 8.9 - 10.3 mg/dL 10.6(H)  Total Protein 6.5 - 8.1 g/dL 6.9  Total Bilirubin 0.3 - 1.2 mg/dL 0.9  Alkaline Phos 38 - 126 U/L 68  AST 15 - 41 U/L 17  ALT 0 - 44 U/L 13   CBC Latest Ref Rng & Units 08/04/2019  WBC 4.0 - 10.5  K/uL 6.8  Hemoglobin 13.0 - 17.0 g/dL 11.8(L)  Hematocrit 39.0 - 52.0 % 36.9(L)  Platelets 150 - 400 K/uL 240     Observation/objective: Appears in no acute distress over video visit today.  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  He is currently on Lupron and Zytiga.  This is a visit to discuss CT scan results and further management  Prior CT scan 3 months ago showed a new right lower lobe masslike lesion 2.5 x 2.2 x 4.4 cm.  He had a repeat CT scan to follow-up on these findings and that nodule has reduced to 2.2 x 2.2 cm.  This favors benign inflammatory or infectious process.  No new pulmonary nodules.  His PSA remained stable between 0.01-0.03.  He will continue taking Zytiga and prednisone at this time.  He has been getting Lupron and would be due for his next dose in July 2021.  AKI: Patient noted to have an elevated creatinine of 1.4. He also underwent a recent CT chest with contrast.  I have asked him to take plenty of oral fluids and we will be repeating his BMP in 2 weeks time  Hypercalcemia: Chronic he has seen endocrinology for the same.  He is not getting Zometa for his hypercalcemia.  He is getting it for worsening of osteopenia prevention in the setting of ADT use.  I will plan to  get a repeat bone density scan next year.   Follow-up instructions: I will see him with CBC with differential CMP and PSA in July.     I discussed the assessment and treatment plan with the patient. The patient was provided an opportunity to ask questions and all were answered. The patient agreed with the plan and demonstrated an understanding of the instructions.   The patient was advised to call back or seek an in-person evaluation if the symptoms worsen or if the condition fails to improve as anticipated.   Visit Diagnosis: 1. AKI (acute kidney injury) (HCC)   2. Lung nodule   3. Prostate cancer metastatic to bone [facility])     Dr. Randa Evens, MD, MPH [facility] Tel- [phone] 08/10/2019 8:40 AM

## 2019-08-23 ENCOUNTER — Other Ambulatory Visit: Payer: Self-pay | Admitting: Oncology

## 2019-08-23 DIAGNOSIS — C7951 Secondary malignant neoplasm of bone: Secondary | ICD-10-CM

## 2019-08-26 MED FILL — ABIRATERONE ACETATE 250 MG: 250 | 30 days supply | Qty: 120 | Fill #0

## 2019-09-24 ENCOUNTER — Other Ambulatory Visit: Payer: Self-pay | Admitting: Oncology

## 2019-09-24 DIAGNOSIS — C61 Malignant neoplasm of prostate: Secondary | ICD-10-CM

## 2019-09-28 MED FILL — ABIRATERONE ACETATE 250 MG: 250 | 30 days supply | Qty: 120 | Fill #0

## 2019-10-04 ENCOUNTER — Other Ambulatory Visit: Payer: Self-pay | Admitting: Oncology

## 2019-10-08 ENCOUNTER — Telehealth: Payer: Self-pay

## 2019-10-11 ENCOUNTER — Other Ambulatory Visit: Payer: Medicare Other

## 2019-10-11 ENCOUNTER — Ambulatory Visit: Payer: Medicare Other

## 2019-10-15 ENCOUNTER — Other Ambulatory Visit: Payer: Self-pay

## 2019-10-15 ENCOUNTER — Inpatient Hospital Stay: Payer: Medicare Other

## 2019-10-15 ENCOUNTER — Inpatient Hospital Stay: Payer: Medicare Other | Attending: Oncology

## 2019-10-15 VITALS — BP 147/74 | HR 68 | Temp 97.4°F | Resp 18

## 2019-10-15 DIAGNOSIS — C7951 Secondary malignant neoplasm of bone: Secondary | ICD-10-CM | POA: Insufficient documentation

## 2019-10-15 DIAGNOSIS — C61 Malignant neoplasm of prostate: Secondary | ICD-10-CM

## 2019-10-15 LAB — CBC WITH DIFFERENTIAL/PLATELET
Abs Immature Granulocytes: 0.03 10*3/uL (ref 0.00–0.07)
Basophils Absolute: 0 10*3/uL (ref 0.0–0.1)
Basophils Relative: 1 %
Eosinophils Absolute: 0.1 10*3/uL (ref 0.0–0.5)
Eosinophils Relative: 2 %
HCT: 36.3 % — ABNORMAL LOW (ref 39.0–52.0)
Hemoglobin: 12 g/dL — ABNORMAL LOW (ref 13.0–17.0)
Immature Granulocytes: 0 %
Lymphocytes Relative: 18 %
Lymphs Abs: 1.5 10*3/uL (ref 0.7–4.0)
MCH: 29.1 pg (ref 26.0–34.0)
MCHC: 33.1 g/dL (ref 30.0–36.0)
MCV: 87.9 fL (ref 80.0–100.0)
Monocytes Absolute: 0.5 10*3/uL (ref 0.1–1.0)
Monocytes Relative: 7 %
Neutro Abs: 6.1 10*3/uL (ref 1.7–7.7)
Neutrophils Relative %: 72 %
Platelets: 227 10*3/uL (ref 150–400)
RBC: 4.13 MIL/uL — ABNORMAL LOW (ref 4.22–5.81)
RDW: 14.2 % (ref 11.5–15.5)
WBC: 8.3 10*3/uL (ref 4.0–10.5)
nRBC: 0 % (ref 0.0–0.2)

## 2019-10-15 LAB — COMPREHENSIVE METABOLIC PANEL WITH GFR
ALT: 13 U/L (ref 0–44)
AST: 14 U/L — ABNORMAL LOW (ref 15–41)
Albumin: 3.9 g/dL (ref 3.5–5.0)
Alkaline Phosphatase: 69 U/L (ref 38–126)
Anion gap: 6 (ref 5–15)
BUN: 24 mg/dL — ABNORMAL HIGH (ref 8–23)
CO2: 31 mmol/L (ref 22–32)
Calcium: 11.4 mg/dL — ABNORMAL HIGH (ref 8.9–10.3)
Chloride: 101 mmol/L (ref 98–111)
Creatinine, Ser: 1.19 mg/dL (ref 0.61–1.24)
GFR calc Af Amer: >60 mL/min
GFR calc non Af Amer: 57 mL/min — ABNORMAL LOW
Glucose, Bld: 124 mg/dL — ABNORMAL HIGH (ref 70–99)
Potassium: 4.5 mmol/L (ref 3.5–5.1)
Sodium: 138 mmol/L (ref 135–145)
Total Bilirubin: 0.7 mg/dL (ref 0.3–1.2)
Total Protein: 6.8 g/dL (ref 6.5–8.1)

## 2019-10-15 LAB — PSA: Prostatic Specific Antigen: 0.02 ng/mL (ref 0.00–4.00)

## 2019-10-15 MED ORDER — LEUPROLIDE ACETATE (6 MONTH) 45 MG ~~LOC~~ KIT
45.0000 mg | PACK | Freq: Once | SUBCUTANEOUS | Status: AC
  Administered 2019-10-15: 45 mg via SUBCUTANEOUS
  Filled 2019-10-15: qty 45

## 2019-10-15 MED ORDER — ZOLEDRONIC ACID 4 MG/100ML IV SOLN
4.0000 mg | INTRAVENOUS | Status: DC
  Administered 2019-10-15: 4 mg via INTRAVENOUS
  Filled 2019-10-15: qty 100

## 2019-10-15 MED ORDER — SODIUM CHLORIDE 0.9 % IV SOLN
Freq: Once | INTRAVENOUS | Status: AC
  Filled 2019-10-15: qty 250

## 2019-10-26 ENCOUNTER — Other Ambulatory Visit: Payer: Self-pay | Admitting: Oncology

## 2019-10-26 DIAGNOSIS — C7951 Secondary malignant neoplasm of bone: Secondary | ICD-10-CM

## 2019-10-26 NOTE — Telephone Encounter (Signed)
CBC with Differential Order: [id number] Status:  Final result Visible to patient:  Yes (not seen) Next appt:  None Dx:  Prostate cancer metastatic to bone ([place])  0 Result Notes  Ref Range & Units 11 d ago 2 mo ago 4 mo ago 6 mo ago  WBC 4.0 - 10.5 K/uL 8.3  6.8  7.4  8.0   RBC 4.22 - 5.81 MIL/uL 4.13Low  4.16Low  4.05Low  4.17Low   Hemoglobin 13.0 - 17.0 g/dL 12.0Low  11.8Low  11.3Low  11.9Low   HCT 39 - 52 % 36.3Low  36.9Low  36.6Low  37.0Low   MCV 80.0 - 100.0 fL 87.9  88.7  90.4  88.7   MCH 26.0 - 34.0 pg 29.1  28.4  27.9  28.5   MCHC 30.0 - 36.0 g/dL 33.1  32.0  30.9  32.2   RDW 11.5 - 15.5 % 14.2  15.1  14.3  14.1   Platelets 150 - 400 K/uL 227  240  230  232   nRBC 0.0 - 0.2 % 0.0  0.0  0.0  0.0   Neutrophils Relative % % 72  57  70  73   Neutro Abs 1.7 - 7.7 K/uL 6.1  3.9  5.2  5.8   Lymphocytes Relative % 18  32  20  19   Lymphs Abs 0.7 - 4.0 K/uL 1.5  2.2  1.5  1.5   Monocytes Relative % 7  8  7  7    Monocytes Absolute 0 - 1 K/uL 0.5  0.5  0.5  0.5   Eosinophils Relative % 2  2  2  1    Eosinophils Absolute 0 - 0 K/uL 0.1  0.2  0.2  0.1   Basophils Relative % 1  1  1   0   Basophils Absolute 0 - 0 K/uL 0.0  0.0  0.0  0.0   Immature Granulocytes % 0  0  0  0   Abs Immature Granulocytes 0.00 - 0.07 K/uL 0.03  0.03 CM  0.03 CM  0.03 CM   Comment: Performed at [facility], [address] [place], [place] [address]  Resulting Agency  [facility] CLIN LAB [place] CLIN LAB [place] CLIN LAB [facility] CLIN LAB      Specimen Collected: 10/15/19 13:53 Last Resulted: 10/15/19 14:06     Lab Flowsheet   Order Details   View Encounter   Lab and Collection Details   Routing   Result History     CM=Additional comments    Result Care Coordination  Patient Communication  Add Comments Add Notifications Back to Top      Other Results from 10/15/2019  Contains abnormal dataComprehensive metabolic panel  Status:  Final result Visible to patient:  Yes (not  seen) Next appt:  None Dx:  Prostate cancer metastatic to bone [facility]) Order: [id number]  0 Result Notes  Ref Range & Units 11 d ago  (10/15/19) 2 mo ago  (08/04/19) 4 mo ago  (06/04/19) 4 mo ago  (05/31/19)  Sodium 135 - 145 mmol/L 138  140  137    Potassium 3.5 - 5.1 mmol/L 4.5  4.7  4.0    Chloride 98 - 111 mmol/L 101  102  102    CO2 22 - 32 mmol/L 31  28  27     Glucose, Bld 70 - 99 mg/dL 124High  110High CM  115High CM    Comment: Glucose reference range applies only to samples taken after fasting  for at least 8 hours.  BUN 8 - 23 mg/dL 24High  24High  19    Creatinine, Ser 0.61 - 1.24 mg/dL 1.19  1.41High  1.10  1.00   Calcium 8.9 - 10.3 mg/dL 11.4High  10.6High  10.7High    Total Protein 6.5 - 8.1 g/dL 6.8  6.9  6.6    Albumin 3.5 - 5.0 g/dL 3.9  3.9  3.7    AST 15 - 41 U/L 14Low  17  13Low    ALT 0 - 44 U/L 13  13  9     Alkaline Phosphatase 38 - 126 U/L 69  68  64    Total Bilirubin 0.3 - 1.2 mg/dL 0.7  0.9  0.7    GFR calc non Af Amer >60 mL/min 57Low  46Low  >60    GFR calc Af Amer >60 mL/min >60  53Low  >60    Anion gap 5 - 15 6  10  CM  8 CM    Comment: Performed at [facility], [address] [place], [place] [address]  Resulting Agency  [facility] CLIN LAB [facility] CLIN LAB [facility] CLIN LAB [facility] CLIN LAB      Specimen Collected: 10/15/19 13:53 Last Resulted: 10/15/19 14:16

## 2019-10-28 MED FILL — ABIRATERONE ACETATE 250 MG: 250 | 30 days supply | Qty: 120 | Fill #0

## 2019-11-06 DIAGNOSIS — Z9889 Other specified postprocedural states: Secondary | ICD-10-CM | POA: Insufficient documentation

## 2019-11-06 DIAGNOSIS — I252 Old myocardial infarction: Secondary | ICD-10-CM | POA: Insufficient documentation

## 2019-11-08 DIAGNOSIS — I5020 Unspecified systolic (congestive) heart failure: Secondary | ICD-10-CM | POA: Insufficient documentation

## 2019-11-23 ENCOUNTER — Other Ambulatory Visit: Payer: Self-pay | Admitting: Oncology

## 2019-11-23 DIAGNOSIS — C61 Malignant neoplasm of prostate: Secondary | ICD-10-CM

## 2019-11-29 MED FILL — ABIRATERONE ACETATE 250 MG: 250 | 30 days supply | Qty: 120 | Fill #0

## 2019-12-21 ENCOUNTER — Other Ambulatory Visit: Payer: Self-pay | Admitting: Oncology

## 2019-12-21 DIAGNOSIS — C7951 Secondary malignant neoplasm of bone: Secondary | ICD-10-CM

## 2019-12-21 NOTE — Telephone Encounter (Signed)
CBC with Differential Order: [id number] Status:  Final result Visible to patient:  Yes (not seen) Next appt:  01/06/2020 at 09:30 AM in Oncology ([facility]) Dx:  Prostate cancer metastatic to bone (HCC)  0 Result Notes  Ref Range & Units 2 mo ago 4 mo ago 6 mo ago 8 mo ago  WBC 4.0 - 10.5 K/uL 8.3  6.8  7.4  8.0   RBC 4.22 - 5.81 MIL/uL 4.13Low  4.16Low  4.05Low  4.17Low   Hemoglobin 13.0 - 17.0 g/dL 12.0Low  11.8Low  11.3Low  11.9Low   HCT 39 - 52 % 36.3Low  36.9Low  36.6Low  37.0Low   MCV 80.0 - 100.0 fL 87.9  88.7  90.4  88.7   MCH 26.0 - 34.0 pg 29.1  28.4  27.9  28.5   MCHC 30.0 - 36.0 g/dL 33.1  32.0  30.9  32.2   RDW 11.5 - 15.5 % 14.2  15.1  14.3  14.1   Platelets 150 - 400 K/uL 227  240  230  232   nRBC 0.0 - 0.2 % 0.0  0.0  0.0  0.0   Neutrophils Relative % % 72  57  70  73   Neutro Abs 1.7 - 7.7 K/uL 6.1  3.9  5.2  5.8   Lymphocytes Relative % 18  32  20  19   Lymphs Abs 0.7 - 4.0 K/uL 1.5  2.2  1.5  1.5   Monocytes Relative % 7  8  7  7    Monocytes Absolute 0 - 1 K/uL 0.5  0.5  0.5  0.5   Eosinophils Relative % 2  2  2  1    Eosinophils Absolute 0 - 0 K/uL 0.1  0.2  0.2  0.1   Basophils Relative % 1  1  1   0   Basophils Absolute 0 - 0 K/uL 0.0  0.0  0.0  0.0   Immature Granulocytes % 0  0  0  0   Abs Immature Granulocytes 0.00 - 0.07 K/uL 0.03  0.03 CM  0.03 CM  0.03 CM   Comment: Performed at [facility], [address] [place], [place] [address]  Resulting Agency  [facility] [place] [facility] [place] [facility] LAB      Specimen Collected: 10/15/19 13:53 Last Resulted: 10/15/19 14:06     Lab Flowsheet   Order Details   View Encounter   Lab and Collection Details   Routing   Result History     CM=Additional comments    Result Care Coordination  Patient Communication  Add Comments Add Notifications Back to Top      Other Results from 10/15/2019  Contains abnormal dataComprehensive metabolic panel  Status:   Final result Visible to patient:  Yes (not seen) Next appt:  01/06/2020 at 09:30 AM in Oncology ([facility]) Dx:  Prostate cancer metastatic to bone [facility]) Order: [id number]  0 Result Notes  Ref Range & Units 2 mo ago  (10/15/19) 4 mo ago  (08/04/19) 6 mo ago  (06/04/19) 6 mo ago  (05/31/19)  Sodium 135 - 145 mmol/L 138  140  137    Potassium 3.5 - 5.1 mmol/L 4.5  4.7  4.0    Chloride 98 - 111 mmol/L 101  102  102    CO2 22 - 32 mmol/L 31  28  27     Glucose, Bld 70 - 99 mg/dL 124High  110High CM  115High CM  Comment: Glucose reference range applies only to samples taken after fasting for at least 8 hours.  BUN 8 - 23 mg/dL 24High  24High  19    Creatinine, Ser 0.61 - 1.24 mg/dL 1.19  1.41High  1.10  1.00   Calcium 8.9 - 10.3 mg/dL 11.4High  10.6High  10.7High    Total Protein 6.5 - 8.1 g/dL 6.8  6.9  6.6    Albumin 3.5 - 5.0 g/dL 3.9  3.9  3.7    AST 15 - 41 U/L 14Low  17  13Low    ALT 0 - 44 U/L 13  13  9     Alkaline Phosphatase 38 - 126 U/L 69  68  64    Total Bilirubin 0.3 - 1.2 mg/dL 0.7  0.9  0.7    GFR calc non Af Amer >60 mL/min 57Low  46Low  >60    GFR calc Af Amer >60 mL/min >60  53Low  >60    Anion gap 5 - 15 6  10  CM  8 CM    Comment: Performed at [facility], [address] [place], [place] [address]  Resulting Agency  [facility] CLIN LAB [place] [facility] [place] [facility] CL[facility] LAB      Specimen Collected: 10/15/19 13:53 Last Resulted: 10/15/19 14:16     Lab Flowsheet   Order Details   View Encounter   Lab and Collection Details   Routing   Result History     CM=Additional comments    Result Care Coordination  Patient Communication  Add Comments Add Notifications Back to Top        PSA  Status:  Final result Visible to patient:  Yes (not seen) Next appt:  01/06/2020 at 09:30 AM in Oncology ([facility]) Dx:  Prostate cancer metastatic to bone [facility]) Order: [id number]  0 Result Notes  Ref Range & Units 2 mo ago 4 mo ago 6 mo  ago 8 mo ago  Prostatic Specific Antigen 0.00 - 4.00 ng/mL 0.02  0.03 CM  0.02 CM  0.02 CM   Comment: (NOTE)  While PSA levels of <=4.0 ng/ml are reported as reference range, some  men with levels below 4.0 ng/ml can have prostate cancer and many men  with PSA above 4.0 ng/ml do not have prostate cancer. Other tests  such as free PSA, age specific reference ranges, PSA velocity and PSA  doubling time may be helpful especially in men less than 12 years  old.  Performed at [facility], [employer] [address], [place], [place]  [address]   Resulting Agency  [facility] CLIN LAB [facility] CLIN LAB [facility] CLIN LAB [facility] CLIN LAB      Specimen Collected: 10/15/19 13:52 Last Resulted: 10/15/19 20:02

## 2019-12-27 ENCOUNTER — Telehealth: Payer: Self-pay | Admitting: Pharmacy Technician

## 2019-12-27 NOTE — Telephone Encounter (Signed)
Oral Oncology Patient Advocate Encounter  Patient has been approved for copay assistance with [employer] (TAF).  [employer] will cover all copayment expenses for Zytiga for the remainder of the calendar year.    The billing information is as follows and has been shared with [place].   Member ID: [id number] Group ID: [id number] PCN: [id number] BIN: [id number] Eligibility Dates: 04/02/19 to 03/31/20  Fund: [place] Patient [place] Phone [phone] Fax [phone] 12/27/2019 12:14 PM

## 2019-12-28 MED FILL — ABIRATERONE ACETATE 250 MG: 250 | 30 days supply | Qty: 120 | Fill #0

## 2019-12-31 ENCOUNTER — Other Ambulatory Visit: Payer: Self-pay | Admitting: Internal Medicine

## 2019-12-31 DIAGNOSIS — R55 Syncope and collapse: Secondary | ICD-10-CM

## 2020-01-06 ENCOUNTER — Other Ambulatory Visit: Payer: Self-pay

## 2020-01-06 ENCOUNTER — Encounter: Payer: Self-pay | Admitting: Oncology

## 2020-01-06 ENCOUNTER — Inpatient Hospital Stay (HOSPITAL_BASED_OUTPATIENT_CLINIC_OR_DEPARTMENT_OTHER): Payer: Medicare Other | Admitting: Oncology

## 2020-01-06 ENCOUNTER — Inpatient Hospital Stay: Payer: Medicare Other | Attending: Oncology

## 2020-01-06 VITALS — BP 115/71 | HR 65 | Temp 98.0°F | Resp 20 | Wt 193.8 lb

## 2020-01-06 DIAGNOSIS — M8580 Other specified disorders of bone density and structure, unspecified site: Secondary | ICD-10-CM | POA: Diagnosis not present

## 2020-01-06 DIAGNOSIS — I10 Essential (primary) hypertension: Secondary | ICD-10-CM | POA: Insufficient documentation

## 2020-01-06 DIAGNOSIS — Z7952 Long term (current) use of systemic steroids: Secondary | ICD-10-CM | POA: Diagnosis not present

## 2020-01-06 DIAGNOSIS — I252 Old myocardial infarction: Secondary | ICD-10-CM | POA: Insufficient documentation

## 2020-01-06 DIAGNOSIS — C7951 Secondary malignant neoplasm of bone: Secondary | ICD-10-CM | POA: Diagnosis not present

## 2020-01-06 DIAGNOSIS — C61 Malignant neoplasm of prostate: Secondary | ICD-10-CM | POA: Insufficient documentation

## 2020-01-06 DIAGNOSIS — Z8249 Family history of ischemic heart disease and other diseases of the circulatory system: Secondary | ICD-10-CM | POA: Insufficient documentation

## 2020-01-06 DIAGNOSIS — Z79899 Other long term (current) drug therapy: Secondary | ICD-10-CM

## 2020-01-06 DIAGNOSIS — E785 Hyperlipidemia, unspecified: Secondary | ICD-10-CM | POA: Insufficient documentation

## 2020-01-06 DIAGNOSIS — Z7901 Long term (current) use of anticoagulants: Secondary | ICD-10-CM | POA: Diagnosis not present

## 2020-01-06 DIAGNOSIS — Z7982 Long term (current) use of aspirin: Secondary | ICD-10-CM | POA: Insufficient documentation

## 2020-01-06 LAB — CBC WITH DIFFERENTIAL/PLATELET
Abs Immature Granulocytes: 0.02 10*3/uL (ref 0.00–0.07)
Basophils Absolute: 0 10*3/uL (ref 0.0–0.1)
Basophils Relative: 1 %
Eosinophils Absolute: 0.2 10*3/uL (ref 0.0–0.5)
Eosinophils Relative: 3 %
HCT: 34.1 % — ABNORMAL LOW (ref 39.0–52.0)
Hemoglobin: 11.1 g/dL — ABNORMAL LOW (ref 13.0–17.0)
Immature Granulocytes: 0 %
Lymphocytes Relative: 30 %
Lymphs Abs: 2.2 10*3/uL (ref 0.7–4.0)
MCH: 28.7 pg (ref 26.0–34.0)
MCHC: 32.6 g/dL (ref 30.0–36.0)
MCV: 88.1 fL (ref 80.0–100.0)
Monocytes Absolute: 0.6 10*3/uL (ref 0.1–1.0)
Monocytes Relative: 8 %
Neutro Abs: 4.2 10*3/uL (ref 1.7–7.7)
Neutrophils Relative %: 58 %
Platelets: 218 10*3/uL (ref 150–400)
RBC: 3.87 MIL/uL — ABNORMAL LOW (ref 4.22–5.81)
RDW: 16.2 % — ABNORMAL HIGH (ref 11.5–15.5)
WBC: 7.3 10*3/uL (ref 4.0–10.5)
nRBC: 0 % (ref 0.0–0.2)

## 2020-01-06 LAB — COMPREHENSIVE METABOLIC PANEL
ALT: 12 U/L (ref 0–44)
AST: 16 U/L (ref 15–41)
Albumin: 3.8 g/dL (ref 3.5–5.0)
Alkaline Phosphatase: 56 U/L (ref 38–126)
Anion gap: 9 (ref 5–15)
BUN: 20 mg/dL (ref 8–23)
CO2: 25 mmol/L (ref 22–32)
Calcium: 10.5 mg/dL — ABNORMAL HIGH (ref 8.9–10.3)
Chloride: 103 mmol/L (ref 98–111)
Creatinine, Ser: 1.16 mg/dL (ref 0.61–1.24)
GFR calc non Af Amer: 58 mL/min — ABNORMAL LOW (ref >60)
Glucose, Bld: 105 mg/dL — ABNORMAL HIGH (ref 70–99)
Potassium: 3.8 mmol/L (ref 3.5–5.1)
Sodium: 137 mmol/L (ref 135–145)
Total Bilirubin: 0.9 mg/dL (ref 0.3–1.2)
Total Protein: 6.7 g/dL (ref 6.5–8.1)

## 2020-01-06 LAB — PSA: Prostatic Specific Antigen: <0.01 ng/mL (ref 0.00–4.00)

## 2020-01-06 NOTE — Progress Notes (Signed)
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 He also has a past history of prostate cancer about 15 years ago(Gleasons score 8)and was treated with prostatectomy at that time. He did not receive any radiation treatment and does not remember if his PSA was subsequently monitored. PSA in March 2015 was 7.9 and in 2014 was 6.7 after prostatectomy. He was last seen by medical oncology Dr. Everlene Other in 2015 and at that time his PSA was 9.2 previously was supposed to follow-up after 2 months and ADT was supposed to be started at that time the patient was lost to follow-up.  He was seen by Dr. Golden Pop and his primary care doctor he saw until about 2 years ago. He has had ongoing chronic back pain and has had back surgery in the past but was having worsening back pain over the last few months and was seen by Dr. Raul Del.   MRI of the lumbar spine on 11/22/2016 revealed extensive osseous metastatic disease with pathologic compression fracture of the L3 vertebral body. Posteriorly displace fragments and metastatic soft tissue result in severe canal stenosis and compression of the thecal sac at this level.  CT chest abdomen and pelvis on 11/25/2016 did not reveal any evidence of metastatic disease other than his bones  Patient had decompression surgery of his L3vertebral body  which showed:Metastatic adenocarcinoma with features consistent with primary prostatic origin. See note.  Note: The patient history of previously diagnosed prostate carcinoma is noted. Immunoperoxidase stains were performed on formalin-fixed (non-decalcified) paraffin-embedded tissue in block A3 with appropriate positive controls. The tumor cells stain with CK8/18, PROSTATIC SPECIFIC ANTIGEN, PROSTATIC ACID PHOSPHATASE and P501S. The tumor cells do not stain with CYTOKERATIN 7, CYTOKERATIN 20, CK5/6, P40, GATA-3, CDX-2 or THYROID TRANSCRIPTION FACTOR (TTF-1). These findings are in support of the interpretation of metastatic adenocarcinoma with features consistent with primary prostatic origin.  Patient also has primary hyperparathyroidism and hypercalcemia secondary to it for which he sees Dr.Akhter from [facility] and has been referred to surgery for the same  Abiraterone was started for high risk castrate sensitive prostate cancer on 02/14/2017.Baseline bone density showed osteopenia with a T score of -1.9 at the right femur neck.  Interval history-patient was recently treated for pneumonia last month.  He also had a possible syncopal episode which is being worked up by his primary care doctor.  He follows up with cardiology as well for his pulmonary artery disease.  He has a history of paroxysmal A. fib and recently he was asked to stop his aspirin and Plavix and was switched to Eliquis.  ECOG PS- 1 Pain scale- 0 Opioid associated constipation- no  Review of systems- Review of Systems  Constitutional: Positive for malaise/fatigue. Negative for chills, fever and weight loss.  HENT: Negative for congestion, ear discharge and nosebleeds.   Eyes: Negative for blurred vision.  Respiratory: Negative for cough, hemoptysis, sputum production, shortness of breath and wheezing.   Cardiovascular: Negative for chest pain, palpitations,  orthopnea and claudication.  Gastrointestinal: Negative for abdominal  pain, blood in stool, constipation, diarrhea, heartburn, melena, nausea and vomiting.  Genitourinary: Negative for dysuria, flank pain, frequency, hematuria and urgency.  Musculoskeletal: Negative for back pain, joint pain and myalgias.  Skin: Negative for rash.  Neurological: Negative for dizziness, tingling, focal weakness, seizures, weakness and headaches.  Endo/Heme/Allergies: Does not bruise/bleed easily.  Psychiatric/Behavioral: Negative for depression and suicidal ideas. The patient does not have insomnia.       No Known Allergies   Past Medical History:  Diagnosis Date  . Arthritis   . Hyperlipidemia   . Hypertension   . Myocardial infarction ([place])   . Prostate cancer ([place])    Prostate CA approximately 11 years ago     Past Surgical History:  Procedure Laterality Date  . APPENDECTOMY    . BACK SURGERY    . CARDIAC CATHETERIZATION     stent x 1  . CHOLECYSTECTOMY    . LUMBAR LAMINECTOMY/DECOMPRESSION MICRODISCECTOMY Left 08/20/2012   Procedure: LUMBAR LAMINECTOMY/DECOMPRESSION MICRODISCECTOMY 1 LEVEL;  Surgeon: Faythe Ghee, MD;  Location: [facility];  Service: Neurosurgery;  Laterality: Left;  lumbar four-five  . PROSTATE SURGERY    . TONSILLECTOMY      Social History   Socioeconomic History  . Marital status: Married    Spouse name: Not on file  . Number of children: Not on file  . Years of education: Not on file  . Highest education level: Not on file  Occupational History  . Not on file  Tobacco Use  . Smoking status: Never Smoker  . Smokeless tobacco: Never Used  Vaping Use  . Vaping Use: Never used  Substance and Sexual Activity  . Alcohol use: No  . Drug use: No  . Sexual activity: Not on file  Other Topics Concern  . Not on file  Social History Narrative  . Not on file   Social Determinants of Health   Financial Resource Strain:   . Difficulty of Paying Living Expenses: Not on file  Food Insecurity:   . Worried About Sales executive in the Last Year: Not on file  . Ran Out of Food in the Last Year: Not on file  Transportation Needs:   . Lack of Transportation (Medical): Not on file  . Lack of Transportation (Non-Medical): Not on file  Physical Activity:   . Days of Exercise per Week: Not on file  . Minutes of Exercise per Session: Not on file  Stress:   . Feeling of Stress : Not on file  Social Connections:   . Frequency of Communication with Friends and Family: Not on file  . Frequency of Social Gatherings with Friends and Family: Not on file  . Attends Religious Services: Not on file  . Active Member of Clubs or Organizations: Not on file  . Attends Archivist Meetings: Not on file  . Marital Status: Not on file  Intimate Partner Violence:   . Fear of Current or Ex-Partner: Not on file  . Emotionally Abused: Not on file  . Physically Abused: Not on file  . Sexually Abused: Not on file    Family History  Problem Relation Age of Onset  . Stroke Mother   . Heart attack Father      Current Outpatient Medications:  .  abiraterone acetate (ZYTIGA) 250 MG tablet, TAKE 4 TABLETS (1,000 MG TOTAL) BY MOUTH DAILY. TAKE ON AN EMPTY STOMACH 1 HOUR BEFORE OR 2  HOURS AFTER A MEAL, Disp: 120 tablet, Rfl: 0 .  apixaban (ELIQUIS) 5 MG TABS tablet, Take by mouth., Disp: , Rfl:  .  atorvastatin (LIPITOR) 10 MG tablet, Take 10 mg by mouth daily., Disp: , Rfl:  .  carvedilol (COREG) 25 MG tablet, Take 25 mg by mouth 2 (two) times daily., Disp: , Rfl:  .  cholecalciferol (VITAMIN D) 1000 units tablet, Take 2,000 Units by mouth daily., Disp: , Rfl:  .  furosemide (LASIX) 20 MG tablet, Take 20 mg by mouth daily., Disp: , Rfl: 11 .  hydrALAZINE (APRESOLINE) 25 MG tablet, Take 25 mg by mouth 3 (three) times daily., Disp: , Rfl:  .  lisinopril (PRINIVIL,ZESTRIL) 40 MG tablet, Take 40 mg by mouth daily., Disp: , Rfl: 3 .  nitroGLYCERIN (NITROSTAT) 0.4 MG SL tablet, As directed, Disp: , Rfl:  .  predniSONE  (DELTASONE) 5 MG tablet, Take 1 tablet by mouth once daily with breakfast, Disp: 90 tablet, Rfl: 0 .  aspirin EC 81 MG tablet, Take 81 mg by mouth daily. (Patient not taking: Reported on 01/06/2020), Disp: , Rfl:  .  CALCIUM PO, Take 1 tablet by mouth 3 (three) times a week. (Patient not taking: Reported on 01/06/2020), Disp: , Rfl:  .  cetirizine (ZYRTEC) 10 MG tablet, Take 10 mg by mouth daily., Disp: , Rfl:  .  clopidogrel (PLAVIX) 75 MG tablet, Take 75 mg by mouth daily. (Patient not taking: Reported on 01/06/2020), Disp: , Rfl:  .  diltiazem (CARDIZEM CD) 120 MG 24 hr capsule, Take by mouth. (Patient not taking: Reported on 01/06/2020), Disp: , Rfl:  .  levofloxacin (LEVAQUIN) 500 MG tablet, Take 1 tablet (500 mg total) by mouth daily. (Patient not taking: Reported on 01/06/2020), Disp: 10 tablet, Rfl: 0 .  metoprolol tartrate (LOPRESSOR) 25 MG tablet, Take 50 mg by mouth 2 (two) times daily.  (Patient not taking: Reported on 01/06/2020), Disp: , Rfl:   Physical exam:  Vitals:   01/06/20 0944  BP: 115/71  Pulse: 65  Resp: 20  Temp: 98 F (36.7 C)  SpO2: 100%  Weight: 193 lb 12.8 oz (87.9 kg)   Physical Exam Constitutional:      General: He is not in acute distress. Cardiovascular:     Rate and Rhythm: Normal rate and regular rhythm.     Heart sounds: Normal heart sounds.  Pulmonary:     Effort: Pulmonary effort is normal.     Breath sounds: Normal breath sounds.  Abdominal:     General: Bowel sounds are normal.     Palpations: Abdomen is soft.  Musculoskeletal:     Right lower leg: No edema.     Left lower leg: No edema.  Skin:    General: Skin is warm and dry.  Neurological:     Mental Status: He is alert and oriented to person, place, and time.      CMP Latest Ref Rng & Units 01/06/2020  Glucose 70 - 99 mg/dL 105(H)  BUN 8 - 23 mg/dL 20  Creatinine 0.61 - 1.24 mg/dL 1.16  Sodium 135 - 145 mmol/L 137  Potassium 3.5 - 5.1 mmol/L 3.8  Chloride 98 - 111 mmol/L 103  CO2 22  - 32 mmol/L 25  Calcium 8.9 - 10.3 mg/dL 10.5(H)  Total Protein 6.5 - 8.1 g/dL 6.7  Total Bilirubin 0.3 - 1.2 mg/dL 0.9  Alkaline Phos 38 - 126 U/L 56  AST 15 - 41 U/L 16  ALT 0 -  44 U/L 12   CBC Latest Ref Rng & Units 01/06/2020  WBC 4.0 - 10.5 K/uL 7.3  Hemoglobin 13.0 - 17.0 g/dL 11.1(L)  Hematocrit 39 - 52 % 34.1(L)  Platelets 150 - 400 K/uL 218      Assessment and plan- Patient is a 85 y.o. male with history of castrate sensitive metastatic prostate cancer with bone metastases.  He is currently on Lupron and Zytiga. He is here for routine f/u of prostate cancer  Clinically patient is doing well and has remained on Zytiga for over 2 years now and tolerating it well without any significant side effects.  Last PSA from July was 0.02 and today's levels are currently pending.  He will continue to take Zytiga until progression or toxicity.  I will see him back in 3 months for routine follow-up along with Zometa which he gets every 6 months for osteopenia and Lupron.    Patient did have recent syncopal episode which is being worked up by his primary care Dr. Doy Hutching.  He will be getting carotid Doppler as well as CT head  Visit Diagnosis 1. High risk medication use   2. Prostate cancer metastatic to bone [facility])      Dr. Randa Evens, MD, MPH [facility] at [facility] [phone] 01/06/2020 3:09 PM

## 2020-01-07 ENCOUNTER — Other Ambulatory Visit: Payer: Self-pay

## 2020-01-07 ENCOUNTER — Ambulatory Visit
Admission: RE | Admit: 2020-01-07 | Discharge: 2020-01-07 | Disposition: A | Discharge status: Home / Self Care | Payer: Medicare Other | Source: Ambulatory Visit | Attending: Internal Medicine | Admitting: Internal Medicine

## 2020-01-07 DIAGNOSIS — R55 Syncope and collapse: Secondary | ICD-10-CM | POA: Insufficient documentation

## 2020-01-12 ENCOUNTER — Telehealth: Payer: Self-pay | Admitting: Pharmacy Technician

## 2020-01-12 ENCOUNTER — Other Ambulatory Visit: Payer: Self-pay | Admitting: Oncology

## 2020-01-12 NOTE — Telephone Encounter (Signed)
Oral Oncology Patient Advocate Encounter  Was successful in securing patient a $8,000 grant from [employer] to provide copayment coverage for Zytiga.  This will keep the out of pocket expense at $0.     [employer] ID: [id number]  I have spoken with the patient.   The billing information is as follows and has been shared with [place].    [employer]: [id number] PCN: [id number] Member ID: [id number] Group ID: [id number] Dates of Eligibility: 12/13/19 through 12/11/20  Fund:  [place] Patient [place] Phone [phone] Fax [phone] 01/12/2020 4:17 PM

## 2020-01-14 ENCOUNTER — Other Ambulatory Visit: Payer: Self-pay | Admitting: Oncology

## 2020-01-17 ENCOUNTER — Other Ambulatory Visit: Payer: Self-pay | Admitting: *Deleted

## 2020-01-17 MED ORDER — PREDNISONE 5 MG PO TABS: 5.0000 mg | ORAL_TABLET | Freq: Every day | ORAL | 0 refills | Status: DC

## 2020-01-20 ENCOUNTER — Other Ambulatory Visit: Payer: Self-pay | Admitting: Oncology

## 2020-01-20 DIAGNOSIS — C7951 Secondary malignant neoplasm of bone: Secondary | ICD-10-CM

## 2020-01-26 MED FILL — ABIRATERONE ACETATE 250 MG: 250 | 30 days supply | Qty: 120 | Fill #0

## 2020-02-18 ENCOUNTER — Other Ambulatory Visit: Payer: Self-pay | Admitting: Oncology

## 2020-02-18 DIAGNOSIS — C7951 Secondary malignant neoplasm of bone: Secondary | ICD-10-CM

## 2020-02-18 DIAGNOSIS — C61 Malignant neoplasm of prostate: Secondary | ICD-10-CM

## 2020-02-28 MED FILL — ABIRATERONE ACETATE 250 MG: 250 | 30 days supply | Qty: 120 | Fill #0

## 2020-03-16 ENCOUNTER — Other Ambulatory Visit: Payer: Self-pay | Admitting: Oncology

## 2020-03-16 DIAGNOSIS — C7951 Secondary malignant neoplasm of bone: Secondary | ICD-10-CM

## 2020-03-27 MED FILL — ABIRATERONE ACETATE 250 MG: 250 | 30 days supply | Qty: 120 | Fill #0

## 2020-04-11 ENCOUNTER — Encounter: Payer: Self-pay | Admitting: Oncology

## 2020-04-17 ENCOUNTER — Inpatient Hospital Stay: Payer: Medicare Other | Admitting: Oncology

## 2020-04-17 ENCOUNTER — Other Ambulatory Visit: Payer: Self-pay | Admitting: Oncology

## 2020-04-17 ENCOUNTER — Inpatient Hospital Stay: Payer: Medicare Other

## 2020-04-20 ENCOUNTER — Other Ambulatory Visit: Payer: Self-pay | Admitting: *Deleted

## 2020-04-20 DIAGNOSIS — C7951 Secondary malignant neoplasm of bone: Secondary | ICD-10-CM

## 2020-04-20 DIAGNOSIS — Z79899 Other long term (current) drug therapy: Secondary | ICD-10-CM

## 2020-04-20 DIAGNOSIS — C61 Malignant neoplasm of prostate: Secondary | ICD-10-CM

## 2020-04-21 ENCOUNTER — Encounter: Payer: Self-pay | Admitting: Oncology

## 2020-04-21 ENCOUNTER — Inpatient Hospital Stay (HOSPITAL_BASED_OUTPATIENT_CLINIC_OR_DEPARTMENT_OTHER): Payer: Medicare Other | Admitting: Oncology

## 2020-04-21 ENCOUNTER — Inpatient Hospital Stay: Payer: Medicare Other | Attending: Oncology

## 2020-04-21 ENCOUNTER — Inpatient Hospital Stay: Payer: Medicare Other

## 2020-04-21 VITALS — BP 125/67 | HR 50 | Resp 18

## 2020-04-21 VITALS — BP 99/59 | HR 45 | Temp 97.8°F | Resp 16 | Wt 201.0 lb

## 2020-04-21 DIAGNOSIS — M85852 Other specified disorders of bone density and structure, left thigh: Secondary | ICD-10-CM | POA: Insufficient documentation

## 2020-04-21 DIAGNOSIS — Z8249 Family history of ischemic heart disease and other diseases of the circulatory system: Secondary | ICD-10-CM | POA: Insufficient documentation

## 2020-04-21 DIAGNOSIS — I252 Old myocardial infarction: Secondary | ICD-10-CM | POA: Insufficient documentation

## 2020-04-21 DIAGNOSIS — Z7901 Long term (current) use of anticoagulants: Secondary | ICD-10-CM | POA: Diagnosis not present

## 2020-04-21 DIAGNOSIS — C7951 Secondary malignant neoplasm of bone: Secondary | ICD-10-CM

## 2020-04-21 DIAGNOSIS — I2510 Atherosclerotic heart disease of native coronary artery without angina pectoris: Secondary | ICD-10-CM | POA: Diagnosis not present

## 2020-04-21 DIAGNOSIS — Z7983 Long term (current) use of bisphosphonates: Secondary | ICD-10-CM | POA: Diagnosis not present

## 2020-04-21 DIAGNOSIS — I10 Essential (primary) hypertension: Secondary | ICD-10-CM | POA: Insufficient documentation

## 2020-04-21 DIAGNOSIS — Z5111 Encounter for antineoplastic chemotherapy: Secondary | ICD-10-CM | POA: Insufficient documentation

## 2020-04-21 DIAGNOSIS — Z79899 Other long term (current) drug therapy: Secondary | ICD-10-CM

## 2020-04-21 DIAGNOSIS — Z7952 Long term (current) use of systemic steroids: Secondary | ICD-10-CM | POA: Insufficient documentation

## 2020-04-21 DIAGNOSIS — C61 Malignant neoplasm of prostate: Secondary | ICD-10-CM | POA: Diagnosis present

## 2020-04-21 DIAGNOSIS — E785 Hyperlipidemia, unspecified: Secondary | ICD-10-CM | POA: Diagnosis not present

## 2020-04-21 LAB — CBC WITH DIFFERENTIAL/PLATELET
Abs Immature Granulocytes: 0.03 10*3/uL (ref 0.00–0.07)
Basophils Absolute: 0.1 10*3/uL (ref 0.0–0.1)
Basophils Relative: 1 %
Eosinophils Absolute: 0.2 10*3/uL (ref 0.0–0.5)
Eosinophils Relative: 3 %
HCT: 37.8 % — ABNORMAL LOW (ref 39.0–52.0)
Hemoglobin: 12.3 g/dL — ABNORMAL LOW (ref 13.0–17.0)
Immature Granulocytes: 0 %
Lymphocytes Relative: 24 %
Lymphs Abs: 1.8 10*3/uL (ref 0.7–4.0)
MCH: 29.1 pg (ref 26.0–34.0)
MCHC: 32.5 g/dL (ref 30.0–36.0)
MCV: 89.4 fL (ref 80.0–100.0)
Monocytes Absolute: 0.6 10*3/uL (ref 0.1–1.0)
Monocytes Relative: 8 %
Neutro Abs: 4.9 10*3/uL (ref 1.7–7.7)
Neutrophils Relative %: 64 %
Platelets: 217 10*3/uL (ref 150–400)
RBC: 4.23 MIL/uL (ref 4.22–5.81)
RDW: 14.1 % (ref 11.5–15.5)
WBC: 7.7 10*3/uL (ref 4.0–10.5)
nRBC: 0 % (ref 0.0–0.2)

## 2020-04-21 LAB — COMPREHENSIVE METABOLIC PANEL
ALT: 10 U/L (ref 0–44)
AST: 14 U/L — ABNORMAL LOW (ref 15–41)
Albumin: 3.9 g/dL (ref 3.5–5.0)
Alkaline Phosphatase: 58 U/L (ref 38–126)
Anion gap: 5 (ref 5–15)
BUN: 21 mg/dL (ref 8–23)
CO2: 31 mmol/L (ref 22–32)
Calcium: 11.3 mg/dL — ABNORMAL HIGH (ref 8.9–10.3)
Chloride: 102 mmol/L (ref 98–111)
Creatinine, Ser: 1.08 mg/dL (ref 0.61–1.24)
GFR, Estimated: >60 mL/min (ref >60)
Glucose, Bld: 137 mg/dL — ABNORMAL HIGH (ref 70–99)
Potassium: 3.6 mmol/L (ref 3.5–5.1)
Sodium: 138 mmol/L (ref 135–145)
Total Bilirubin: 0.8 mg/dL (ref 0.3–1.2)
Total Protein: 6.9 g/dL (ref 6.5–8.1)

## 2020-04-21 LAB — PSA: Prostatic Specific Antigen: 0.02 ng/mL (ref 0.00–4.00)

## 2020-04-21 MED ORDER — SODIUM CHLORIDE 0.9 % IV SOLN
INTRAVENOUS | Status: DC
  Filled 2020-04-21: qty 250

## 2020-04-21 MED ORDER — ZOLEDRONIC ACID 4 MG/100ML IV SOLN
4.0000 mg | INTRAVENOUS | Status: DC
  Administered 2020-04-21: 4 mg via INTRAVENOUS
  Filled 2020-04-21: qty 100

## 2020-04-21 MED ORDER — LEUPROLIDE ACETATE (6 MONTH) 45 MG ~~LOC~~ KIT
45.0000 mg | PACK | Freq: Once | SUBCUTANEOUS | Status: AC
  Administered 2020-04-21: 45 mg via SUBCUTANEOUS
  Filled 2020-04-21: qty 45

## 2020-04-21 NOTE — Progress Notes (Signed)
Pt received prescribed treatment in clinic, pt stable at d/c. 

## 2020-04-21 NOTE — Progress Notes (Signed)
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 He also has a past history of prostate cancer about 15 years ago(Gleasons score 8)and was treated with prostatectomy at that time. He did not receive any radiation treatment and does not remember if his PSA was subsequently monitored. PSA in March 2015 was 7.9 and in 2014 was 6.7 after prostatectomy. He was last seen by medical oncology Dr. Everlene Other in 2015 and at that time his PSA was 9.2 previously was supposed to follow-up after 2 months and ADT was supposed to be started at that time the patient was lost to follow-up.  He was seen by Dr. Golden Pop and his primary care doctor he saw until about 2 years ago. He has had ongoing chronic back pain and has had back surgery in the past but was having worsening back pain over the last few months and was seen by Dr. Raul Del.   MRI of the lumbar spine on 11/22/2016 revealed extensive osseous metastatic disease with pathologic compression fracture of the L3 vertebral body. Posteriorly displace fragments and metastatic soft tissue result in severe canal stenosis and compression of the thecal sac at this level.  CT chest abdomen and pelvis on 11/25/2016 did not reveal any evidence of metastatic disease other  than his bones  Patient had decompression surgery of his L3vertebral body which showed:Metastatic adenocarcinoma with features consistent with primary prostatic origin. See note.  Note: The patient history of previously diagnosed prostate carcinoma is noted. Immunoperoxidase stains were performed on formalin-fixed (non-decalcified) paraffin-embedded tissue in block A3 with appropriate positive controls. The tumor cells stain with CK8/18, PROSTATIC SPECIFIC ANTIGEN, PROSTATIC ACID PHOSPHATASE and P501S. The tumor cells do not stain with CYTOKERATIN 7, CYTOKERATIN 20, CK5/6, P40, GATA-3, CDX-2 or THYROID TRANSCRIPTION FACTOR (TTF-1). These findings are in support of the interpretation of metastatic adenocarcinoma with features consistent with primary prostatic origin.  Patient also has primary hyperparathyroidism and hypercalcemia secondary to it for which he sees Dr.Akhter from [facility] and has been referred to surgery for the same  Abiraterone was started for high risk castrate sensitive prostate cancer on 02/14/2017.Baseline bone density showed osteopenia with a T score of -1.9 at the right femur neck.  Interval history-patient is tolerating Zytiga well without any significant side effects.  He has intermittent leg swelling.  He remains independent of his ADLs and IADLs  ECOG PS- 1 Pain scale- 0   Review of systems- Review of Systems  Constitutional: Positive for malaise/fatigue. Negative for chills, fever and weight loss.  HENT: Negative for congestion, ear discharge and nosebleeds.   Eyes: Negative for blurred vision.  Respiratory: Negative for cough, hemoptysis, sputum production, shortness of breath and wheezing.   Cardiovascular: Negative for chest pain, palpitations, orthopnea and claudication.  Gastrointestinal: Negative for abdominal pain, blood in stool, constipation, diarrhea, heartburn, melena, nausea and vomiting.  Genitourinary: Negative for dysuria, flank pain, frequency,  hematuria  and urgency.  Musculoskeletal: Negative for back pain, joint pain and myalgias.  Skin: Negative for rash.  Neurological: Negative for dizziness, tingling, focal weakness, seizures, weakness and headaches.  Endo/Heme/Allergies: Does not bruise/bleed easily.  Psychiatric/Behavioral: Negative for depression and suicidal ideas. The patient does not have insomnia.      No Known Allergies   Past Medical History:  Diagnosis Date  . Arthritis   . Hyperlipidemia   . Hypertension   . Myocardial infarction ([place])   . Prostate cancer ([place])    Prostate CA approximately 11 years ago     Past Surgical History:  Procedure Laterality Date  . APPENDECTOMY    . BACK SURGERY    . CARDIAC CATHETERIZATION     stent x 1  . CHOLECYSTECTOMY    . LUMBAR LAMINECTOMY/DECOMPRESSION MICRODISCECTOMY Left 08/20/2012   Procedure: LUMBAR LAMINECTOMY/DECOMPRESSION MICRODISCECTOMY 1 LEVEL;  Surgeon: Faythe Ghee, MD;  Location: [facility];  Service: Neurosurgery;  Laterality: Left;  lumbar four-five  . PROSTATE SURGERY    . TONSILLECTOMY      Social History   Socioeconomic History  . Marital status: Married    Spouse name: Not on file  . Number of children: Not on file  . Years of education: Not on file  . Highest education level: Not on file  Occupational History  . Not on file  Tobacco Use  . Smoking status: Never Smoker  . Smokeless tobacco: Never Used  Vaping Use  . Vaping Use: Never used  Substance and Sexual Activity  . Alcohol use: No  . Drug use: No  . Sexual activity: Not on file  Other Topics Concern  . Not on file  Social History Narrative  . Not on file   Social Determinants of Health   Financial Resource Strain: Not on file  Food Insecurity: Not on file  Transportation Needs: Not on file  Physical Activity: Not on file  Stress: Not on file  Social Connections: Not on file  Intimate Partner Violence: Not on file    Family History  Problem Relation Age of  Onset  . Stroke Mother   . Heart attack Father      Current Outpatient Medications:  .  abiraterone acetate (ZYTIGA) 250 MG tablet, TAKE 4 TABLETS (1,000 MG TOTAL) BY MOUTH DAILY. TAKE ON AN EMPTY STOMACH 1 HOUR BEFORE OR 2 HOURS AFTER A MEAL, Disp: 120 tablet, Rfl: 0 .  apixaban (ELIQUIS) 5 MG TABS tablet, Take by mouth., Disp: , Rfl:  .  atorvastatin (LIPITOR) 10 MG tablet, Take 10 mg by mouth daily., Disp: , Rfl:  .  carvedilol (COREG) 25 MG tablet, Take 25 mg by mouth 2 (two) times daily., Disp: , Rfl:  .  cholecalciferol (VITAMIN D) 1000 units tablet, Take 2,000 Units by mouth daily., Disp: , Rfl:  .  cinacalcet (SENSIPAR) 30 MG tablet, Take 30 mg by mouth 3 (three) times a week., Disp: , Rfl:  .  furosemide (LASIX) 20 MG tablet, Take 20 mg by mouth daily., Disp: , Rfl: 11 .  hydrALAZINE (APRESOLINE) 25 MG tablet, Take 25 mg by mouth 3 (three) times daily., Disp: , Rfl:  .  lisinopril (PRINIVIL,ZESTRIL) 40 MG tablet, Take 40 mg by mouth daily., Disp: , Rfl: 3 .  nitroGLYCERIN (NITROSTAT) 0.4 MG SL tablet, As directed, Disp: , Rfl:  .  predniSONE (DELTASONE) 5 MG tablet, Take 1 tablet by mouth once daily with breakfast, Disp: 90 tablet, Rfl: 0 No current facility-administered medications  for this visit.  Facility-Administered Medications Ordered in Other Visits:  .  0.9 %  sodium chloride infusion, , Intravenous, Continuous, Sindy Guadeloupe, MD, Stopped at 04/21/20 1157 .  Zoledronic Acid (ZOMETA) IVPB 4 mg, 4 mg, Intravenous, Q6 months, Sindy Guadeloupe, MD, Stopped at 04/21/20 1145  Physical exam: There were no vitals filed for this visit. Physical Exam Constitutional:      General: He is not in acute distress. Eyes:     Extraocular Movements: EOM normal.  Cardiovascular:     Rate and Rhythm: Normal rate and regular rhythm.     Heart sounds: Normal heart sounds.  Pulmonary:     Effort: Pulmonary effort is normal.     Breath sounds: Normal breath sounds.  Abdominal:      General: Bowel sounds are normal.     Palpations: Abdomen is soft.  Musculoskeletal:     Cervical back: Normal range of motion.  Skin:    General: Skin is warm and dry.  Neurological:     Mental Status: He is alert and oriented to person, place, and time.      CMP Latest Ref Rng & Units 04/21/2020  Glucose 70 - 99 mg/dL 137(H)  BUN 8 - 23 mg/dL 21  Creatinine 0.61 - 1.24 mg/dL 1.08  Sodium 135 - 145 mmol/L 138  Potassium 3.5 - 5.1 mmol/L 3.6  Chloride 98 - 111 mmol/L 102  CO2 22 - 32 mmol/L 31  Calcium 8.9 - 10.3 mg/dL 11.3(H)  Total Protein 6.5 - 8.1 g/dL 6.9  Total Bilirubin 0.3 - 1.2 mg/dL 0.8  Alkaline Phos 38 - 126 U/L 58  AST 15 - 41 U/L 14(L)  ALT 0 - 44 U/L 10   CBC Latest Ref Rng & Units 04/21/2020  WBC 4.0 - 10.5 K/uL 7.7  Hemoglobin 13.0 - 17.0 g/dL 12.3(L)  Hematocrit 39.0 - 52.0 % 37.8(L)  Platelets 150 - 400 K/uL 217      Assessment and plan- Patient is a 86 y.o. male with castrate sensitive metastatic prostate cancer with bone metastases here for routine follow-up  Patient will receive Lupron today.  He also gets Zometa every 6 months for his osteopenia which she will receive today.  Patient has baseline hypercalcemia unrelated to malignancy which he has had for many years now and has seen endocrinology in the past as well.  Zometa would help his hypercalcemia as well  Metastatic prostate cancer: Overall stable on Zytiga and Lupron for the last 3 years.  Last PSA was undetectable.  PSA from today is pending.  I am getting yearly scans at this time and we will plan to get repeat scans in the next 2 to 3 months CT chest abdomen pelvis with contrast and a bone scan I will see him thereafter   Visit Diagnosis 1. Prostate cancer metastatic to bone ([place])   2. High risk medication use   3. Long term (current) use of bisphosphonates   4. Osteopenia of neck of left femur      Dr. Randa Evens, MD, MPH [facility] at [facility] [phone] 04/21/2020 1:06 PM

## 2020-04-21 NOTE — Progress Notes (Signed)
Pt doing well. No c/o from prostate standpoint.

## 2020-04-24 ENCOUNTER — Other Ambulatory Visit: Payer: Self-pay | Admitting: Oncology

## 2020-04-24 DIAGNOSIS — C61 Malignant neoplasm of prostate: Secondary | ICD-10-CM

## 2020-04-24 DIAGNOSIS — C7951 Secondary malignant neoplasm of bone: Secondary | ICD-10-CM

## 2020-04-27 MED FILL — ABIRATERONE ACETATE 250 MG: 250 | 30 days supply | Qty: 120 | Fill #0

## 2020-05-23 ENCOUNTER — Other Ambulatory Visit: Payer: Self-pay | Admitting: Oncology

## 2020-05-23 DIAGNOSIS — C61 Malignant neoplasm of prostate: Secondary | ICD-10-CM

## 2020-05-25 MED FILL — ABIRATERONE ACETATE 250 MG: 250 | 30 days supply | Qty: 120 | Fill #0

## 2020-06-26 ENCOUNTER — Other Ambulatory Visit: Payer: Self-pay | Admitting: Oncology

## 2020-06-26 DIAGNOSIS — C61 Malignant neoplasm of prostate: Secondary | ICD-10-CM

## 2020-06-28 MED FILL — ABIRATERONE ACETATE 250 MG: 250 | 30 days supply | Qty: 120 | Fill #0

## 2020-06-30 ENCOUNTER — Other Ambulatory Visit (HOSPITAL_COMMUNITY): Payer: Self-pay

## 2020-07-11 ENCOUNTER — Other Ambulatory Visit: Payer: Self-pay

## 2020-07-11 ENCOUNTER — Ambulatory Visit
Admission: RE | Admit: 2020-07-11 | Discharge: 2020-07-11 | Disposition: A | Discharge status: Home / Self Care | Payer: Medicare Other | Source: Ambulatory Visit | Attending: Oncology | Admitting: Oncology

## 2020-07-11 ENCOUNTER — Encounter
Admission: RE | Admit: 2020-07-11 | Discharge: 2020-07-11 | Disposition: A | Discharge status: Home / Self Care | Payer: Medicare Other | Source: Ambulatory Visit | Attending: Oncology | Admitting: Oncology

## 2020-07-11 DIAGNOSIS — C61 Malignant neoplasm of prostate: Secondary | ICD-10-CM | POA: Diagnosis not present

## 2020-07-11 DIAGNOSIS — C7951 Secondary malignant neoplasm of bone: Secondary | ICD-10-CM | POA: Insufficient documentation

## 2020-07-11 DIAGNOSIS — Z79899 Other long term (current) drug therapy: Secondary | ICD-10-CM | POA: Insufficient documentation

## 2020-07-11 MED ORDER — TECHNETIUM TC 99M MEDRONATE IV KIT
20.0000 | PACK | Freq: Once | INTRAVENOUS | Status: AC | PRN
  Administered 2020-07-11: 21.263 via INTRAVENOUS

## 2020-07-11 MED ORDER — IOHEXOL 300 MG/ML  SOLN
100.0000 mL | Freq: Once | INTRAMUSCULAR | Status: AC | PRN
  Administered 2020-07-11: 100 mL via INTRAVENOUS

## 2020-07-20 ENCOUNTER — Inpatient Hospital Stay (HOSPITAL_BASED_OUTPATIENT_CLINIC_OR_DEPARTMENT_OTHER): Payer: Medicare Other | Admitting: Oncology

## 2020-07-20 ENCOUNTER — Inpatient Hospital Stay: Payer: Medicare Other | Attending: Oncology

## 2020-07-20 ENCOUNTER — Encounter: Payer: Self-pay | Admitting: Oncology

## 2020-07-20 VITALS — BP 121/91 | HR 63 | Temp 97.7°F | Wt 198.3 lb

## 2020-07-20 DIAGNOSIS — Z7952 Long term (current) use of systemic steroids: Secondary | ICD-10-CM | POA: Insufficient documentation

## 2020-07-20 DIAGNOSIS — Z79899 Other long term (current) drug therapy: Secondary | ICD-10-CM | POA: Diagnosis not present

## 2020-07-20 DIAGNOSIS — K769 Liver disease, unspecified: Secondary | ICD-10-CM | POA: Diagnosis not present

## 2020-07-20 DIAGNOSIS — I700 Atherosclerosis of aorta: Secondary | ICD-10-CM | POA: Insufficient documentation

## 2020-07-20 DIAGNOSIS — K5730 Diverticulosis of large intestine without perforation or abscess without bleeding: Secondary | ICD-10-CM | POA: Diagnosis not present

## 2020-07-20 DIAGNOSIS — G8929 Other chronic pain: Secondary | ICD-10-CM | POA: Insufficient documentation

## 2020-07-20 DIAGNOSIS — C61 Malignant neoplasm of prostate: Secondary | ICD-10-CM | POA: Insufficient documentation

## 2020-07-20 DIAGNOSIS — M8580 Other specified disorders of bone density and structure, unspecified site: Secondary | ICD-10-CM | POA: Insufficient documentation

## 2020-07-20 DIAGNOSIS — N281 Cyst of kidney, acquired: Secondary | ICD-10-CM | POA: Diagnosis not present

## 2020-07-20 DIAGNOSIS — Z823 Family history of stroke: Secondary | ICD-10-CM | POA: Insufficient documentation

## 2020-07-20 DIAGNOSIS — M4850XA Collapsed vertebra, not elsewhere classified, site unspecified, initial encounter for fracture: Secondary | ICD-10-CM | POA: Insufficient documentation

## 2020-07-20 DIAGNOSIS — Z7901 Long term (current) use of anticoagulants: Secondary | ICD-10-CM | POA: Insufficient documentation

## 2020-07-20 DIAGNOSIS — C7951 Secondary malignant neoplasm of bone: Secondary | ICD-10-CM

## 2020-07-20 DIAGNOSIS — M549 Dorsalgia, unspecified: Secondary | ICD-10-CM | POA: Diagnosis not present

## 2020-07-20 DIAGNOSIS — Z9049 Acquired absence of other specified parts of digestive tract: Secondary | ICD-10-CM | POA: Diagnosis not present

## 2020-07-20 DIAGNOSIS — Z9079 Acquired absence of other genital organ(s): Secondary | ICD-10-CM | POA: Diagnosis not present

## 2020-07-20 DIAGNOSIS — I2510 Atherosclerotic heart disease of native coronary artery without angina pectoris: Secondary | ICD-10-CM | POA: Insufficient documentation

## 2020-07-20 DIAGNOSIS — Z8249 Family history of ischemic heart disease and other diseases of the circulatory system: Secondary | ICD-10-CM | POA: Diagnosis not present

## 2020-07-20 LAB — COMPREHENSIVE METABOLIC PANEL
ALT: 14 U/L (ref 0–44)
AST: 17 U/L (ref 15–41)
Albumin: 3.8 g/dL (ref 3.5–5.0)
Alkaline Phosphatase: 54 U/L (ref 38–126)
Anion gap: 9 (ref 5–15)
BUN: 21 mg/dL (ref 8–23)
CO2: 27 mmol/L (ref 22–32)
Calcium: 10.6 mg/dL — ABNORMAL HIGH (ref 8.9–10.3)
Chloride: 102 mmol/L (ref 98–111)
Creatinine, Ser: 1.24 mg/dL (ref 0.61–1.24)
GFR, Estimated: 58 mL/min — ABNORMAL LOW (ref >60)
Glucose, Bld: 127 mg/dL — ABNORMAL HIGH (ref 70–99)
Potassium: 4 mmol/L (ref 3.5–5.1)
Sodium: 138 mmol/L (ref 135–145)
Total Bilirubin: 0.9 mg/dL (ref 0.3–1.2)
Total Protein: 6.6 g/dL (ref 6.5–8.1)

## 2020-07-20 LAB — CBC WITH DIFFERENTIAL/PLATELET
Abs Immature Granulocytes: 0.03 10*3/uL (ref 0.00–0.07)
Basophils Absolute: 0 10*3/uL (ref 0.0–0.1)
Basophils Relative: 1 %
Eosinophils Absolute: 0.2 10*3/uL (ref 0.0–0.5)
Eosinophils Relative: 2 %
HCT: 38.3 % — ABNORMAL LOW (ref 39.0–52.0)
Hemoglobin: 12.4 g/dL — ABNORMAL LOW (ref 13.0–17.0)
Immature Granulocytes: 0 %
Lymphocytes Relative: 23 %
Lymphs Abs: 1.6 10*3/uL (ref 0.7–4.0)
MCH: 29 pg (ref 26.0–34.0)
MCHC: 32.4 g/dL (ref 30.0–36.0)
MCV: 89.7 fL (ref 80.0–100.0)
Monocytes Absolute: 0.5 10*3/uL (ref 0.1–1.0)
Monocytes Relative: 7 %
Neutro Abs: 4.6 10*3/uL (ref 1.7–7.7)
Neutrophils Relative %: 67 %
Platelets: 233 10*3/uL (ref 150–400)
RBC: 4.27 MIL/uL (ref 4.22–5.81)
RDW: 15.4 % (ref 11.5–15.5)
WBC: 6.9 10*3/uL (ref 4.0–10.5)
nRBC: 0 % (ref 0.0–0.2)

## 2020-07-20 LAB — PSA: Prostatic Specific Antigen: <0.01 ng/mL (ref 0.00–4.00)

## 2020-07-20 NOTE — Progress Notes (Signed)
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 He also has a past history of prostate cancer about 15 years ago(Gleasons score 8)and was treated with prostatectomy at that time. He did not receive any radiation treatment and does not remember if his PSA was subsequently monitored. PSA in March 2015 was 7.9 and in 2014 was 6.7 after prostatectomy. He was last seen by medical oncology Dr. Everlene Other in 2015 and at that time his PSA was 9.2 previously was supposed to follow-up after 2 months and ADT was supposed to be started at that time the patient was lost to follow-up.  He was seen by Dr. Golden Pop and his primary care doctor he saw until about 2 years ago. He has had ongoing chronic back pain and has had back surgery in the past but was having worsening back pain over the last few months and was seen by Dr. Raul Del.   MRI of the lumbar spine on 11/22/2016 revealed extensive osseous metastatic disease with pathologic compression fracture of the L3 vertebral body. Posteriorly displace fragments and metastatic soft tissue result in severe canal stenosis and compression of the thecal sac at this level.  CT chest abdomen and pelvis on 11/25/2016 did  not reveal any evidence of metastatic disease other than his bones  Patient had decompression surgery of his L3vertebral body which showed:Metastatic adenocarcinoma with features consistent with primary prostatic origin. See note.  Note: The patient history of previously diagnosed prostate carcinoma is noted. Immunoperoxidase stains were performed on formalin-fixed (non-decalcified) paraffin-embedded tissue in block A3 with appropriate positive controls. The tumor cells stain with CK8/18, PROSTATIC SPECIFIC ANTIGEN, PROSTATIC ACID PHOSPHATASE and P501S. The tumor cells do not stain with CYTOKERATIN 7, CYTOKERATIN 20, CK5/6, P40, GATA-3, CDX-2 or THYROID TRANSCRIPTION FACTOR (TTF-1). These findings are in support of the interpretation of metastatic adenocarcinoma with features consistent with primary prostatic origin.  Patient also has primary hyperparathyroidism and hypercalcemia secondary to it for which he sees Dr.Akhter from [facility] and has been referred to surgery for the same  Abiraterone was started for high risk castrate sensitive prostate cancer on 02/14/2017.Baseline bone density showed osteopenia with a T score of -1.9 at the right femur neck.   Interval history-patient is tolerating Zytiga well without any significant side effects.  He has mild baseline fatigue but denies other complaints at this time  ECOG PS- 1 Pain scale- 0 Opioid associated constipation- no  Review of systems- Review of Systems  Constitutional: Positive for malaise/fatigue. Negative for chills, fever and weight loss.  HENT: Negative for congestion, ear discharge and nosebleeds.   Eyes: Negative for blurred vision.  Respiratory: Negative for cough, hemoptysis, sputum production, shortness of breath and wheezing.   Cardiovascular: Negative for chest pain, palpitations, orthopnea and claudication.  Gastrointestinal: Negative for abdominal pain, blood in stool, constipation, diarrhea, heartburn, melena, nausea  and  vomiting.  Genitourinary: Negative for dysuria, flank pain, frequency, hematuria and urgency.  Musculoskeletal: Negative for back pain, joint pain and myalgias.  Skin: Negative for rash.  Neurological: Negative for dizziness, tingling, focal weakness, seizures, weakness and headaches.  Endo/Heme/Allergies: Does not bruise/bleed easily.  Psychiatric/Behavioral: Negative for depression and suicidal ideas. The patient does not have insomnia.        No Known Allergies   Past Medical History:  Diagnosis Date  . Arthritis   . Hyperlipidemia   . Hypertension   . Myocardial infarction ([place])   . Prostate cancer ([place])    Prostate CA approximately 11 years ago     Past Surgical History:  Procedure Laterality Date  . APPENDECTOMY    . BACK SURGERY    . CARDIAC CATHETERIZATION     stent x 1  . CHOLECYSTECTOMY    . LUMBAR LAMINECTOMY/DECOMPRESSION MICRODISCECTOMY Left 08/20/2012   Procedure: LUMBAR LAMINECTOMY/DECOMPRESSION MICRODISCECTOMY 1 LEVEL;  Surgeon: Faythe Ghee, MD;  Location: [facility];  Service: Neurosurgery;  Laterality: Left;  lumbar four-five  . PROSTATE SURGERY    . TONSILLECTOMY      Social History   Socioeconomic History  . Marital status: Married    Spouse name: Not on file  . Number of children: Not on file  . Years of education: Not on file  . Highest education level: Not on file  Occupational History  . Not on file  Tobacco Use  . Smoking status: Never Smoker  . Smokeless tobacco: Never Used  Vaping Use  . Vaping Use: Never used  Substance and Sexual Activity  . Alcohol use: No  . Drug use: No  . Sexual activity: Not on file  Other Topics Concern  . Not on file  Social History Narrative  . Not on file   Social Determinants of Health   Financial Resource Strain: Not on file  Food Insecurity: Not on file  Transportation Needs: Not on file  Physical Activity: Not on file  Stress: Not on file  Social Connections: Not on file  Intimate  Partner Violence: Not on file    Family History  Problem Relation Age of Onset  . Stroke Mother   . Heart attack Father      Current Outpatient Medications:  .  abiraterone acetate (ZYTIGA) 250 MG tablet, TAKE 4 TABLETS (1,000 MG TOTAL) BY MOUTH DAILY. TAKE ON AN EMPTY STOMACH 1 HOUR BEFORE OR 2 HOURS AFTER A MEAL (Patient taking differently: Take by mouth daily. Take on an empty stomach 1 hour before or 2 hours after a meal.), Disp: 120 tablet, Rfl: 0 .  apixaban (ELIQUIS) 5 MG TABS tablet, Take by mouth., Disp: , Rfl:  .  atorvastatin (LIPITOR) 10 MG tablet, Take 10 mg by mouth daily., Disp: , Rfl:  .  carvedilol (COREG) 25 MG tablet, Take 25 mg by mouth 2 (two) times daily., Disp: , Rfl:  .  cholecalciferol (VITAMIN D) 1000 units tablet, Take 2,000 Units by mouth daily., Disp: , Rfl:  .  cinacalcet (SENSIPAR) 30 MG tablet, Take 30 mg by mouth 3 (three) times a week., Disp: , Rfl:  .  furosemide (LASIX) 20 MG tablet, Take 20 mg by mouth daily., Disp: , Rfl: 11 .  hydrALAZINE (APRESOLINE) 25 MG tablet, Take 25 mg by mouth 3 (three) times daily., Disp: , Rfl:  .  lisinopril (PRINIVIL,ZESTRIL) 40 MG tablet, Take 40 mg by mouth daily., Disp: , Rfl: 3 .  nitroGLYCERIN (NITROSTAT) 0.4 MG  SL tablet, As directed, Disp: , Rfl:  .  predniSONE (DELTASONE) 5 MG tablet, Take 1 tablet by mouth once daily with breakfast, Disp: 90 tablet, Rfl: 0  Physical exam:  Vitals:   07/20/20 1036  BP: (!) 121/91  Pulse: 63  Temp: 97.7 F (36.5 C)  TempSrc: Tympanic  SpO2: 100%  Weight: 198 lb 4.8 oz (89.9 kg)   Physical Exam Constitutional:      General: He is not in acute distress. Cardiovascular:     Rate and Rhythm: Normal rate and regular rhythm.     Heart sounds: Normal heart sounds.  Pulmonary:     Effort: Pulmonary effort is normal.     Breath sounds: Normal breath sounds.  Skin:    General: Skin is warm and dry.  Neurological:     Mental Status: He is alert and oriented to person, place,  and time.      CMP Latest Ref Rng & Units 07/20/2020  Glucose 70 - 99 mg/dL 127(H)  BUN 8 - 23 mg/dL 21  Creatinine 0.61 - 1.24 mg/dL 1.24  Sodium 135 - 145 mmol/L 138  Potassium 3.5 - 5.1 mmol/L 4.0  Chloride 98 - 111 mmol/L 102  CO2 22 - 32 mmol/L 27  Calcium 8.9 - 10.3 mg/dL 10.6(H)  Total Protein 6.5 - 8.1 g/dL 6.6  Total Bilirubin 0.3 - 1.2 mg/dL 0.9  Alkaline Phos 38 - 126 U/L 54  AST 15 - 41 U/L 17  ALT 0 - 44 U/L 14   CBC Latest Ref Rng & Units 07/20/2020  WBC 4.0 - 10.5 K/uL 6.9  Hemoglobin 13.0 - 17.0 g/dL 12.4(L)  Hematocrit 39.0 - 52.0 % 38.3(L)  Platelets 150 - 400 K/uL 233    No images are attached to the encounter.  NM Bone Scan Whole Body  Result Date: 07/12/2020 CLINICAL DATA:  Metastatic prostate cancer on oral chemotherapy. EXAM: NUCLEAR MEDICINE WHOLE BODY BONE SCAN TECHNIQUE: Whole body anterior and posterior images were obtained approximately 3 hours after intravenous injection of radiopharmaceutical. RADIOPHARMACEUTICALS:  21.3 mCi Technetium-73m MDP IV COMPARISON:  Whole-body bone scan 05/31/2019. CTs today and 05/31/2019 FINDINGS: The multifocal punctate uptake within the thoracolumbar spine and ribs is similar to the previous study, compatible with known osseous metastatic disease. No new lesions are identified. There is degenerative uptake medially in both knees. The soft tissue uptake appears normal. IMPRESSION: The degree of uptake within the widespread osseous metastatic disease is unchanged from the previous bone scan of last year, and the sclerotic metastases appear stable by CT. No evidence of disease progression. Electronically Signed   By: Richardean Sale M.D.   On: 07/12/2020 11:15   CT CHEST ABDOMEN PELVIS W CONTRAST  Result Date: 07/12/2020 CLINICAL DATA:  Restaging metastatic prostate cancer on oral chemotherapy. EXAM: CT CHEST, ABDOMEN, AND PELVIS WITH CONTRAST TECHNIQUE: Multidetector CT imaging of the chest, abdomen and pelvis was performed  following the standard protocol during bolus administration of intravenous contrast. CONTRAST:  141mL OMNIPAQUE IOHEXOL 300 MG/ML  SOLN COMPARISON:  CTs 08/04/2019 and 05/31/2019. Whole-body bone scan today and 05/31/2019. FINDINGS: CT CHEST FINDINGS Cardiovascular: Atherosclerosis of the aorta, great vessels and coronary arteries. No acute vascular findings. The pulmonary arteries appear normal. The heart size is normal. There is no pericardial effusion. Mediastinum/Nodes: There are no enlarged mediastinal, hilar or axillary lymph nodes. There is a stable retroesophageal hyperdense soft tissue nodule measuring 1.8 x 1.3 cm on image 6/2 at the level of the thoracic inlet. Appearance  suggests ectopic thyroid tissue. Thyroid gland, trachea and esophagus otherwise appear unremarkable. Lungs/Pleura: There is no pleural effusion or pneumothorax. The previously demonstrated nodular density at the left lung base is unchanged from the most recent study, now measuring 1.6 x 1.2 cm on image 119/6. This has decreased in size from the study of 13 months ago at which time it measured 2.2 x 2.2 cm. No new or enlarging pulmonary nodules. Musculoskeletal/Chest wall: Multiple sclerotic osseous metastases are grossly stable. No lytic lesion or pathologic fracture identified. CT ABDOMEN AND PELVIS FINDINGS Hepatobiliary: Multiple small low-density hepatic lesions are stable, consistent with cysts. There is a stable enhancing lesion in the left lobe adjacent to the falciform ligament, most consistent with a hemangioma. No new or enlarging hepatic lesions. Stable mild extrahepatic biliary dilatation post cholecystectomy, likely physiologic. Pancreas: Unremarkable. No pancreatic ductal dilatation or surrounding inflammatory changes. Spleen: Normal in size without focal abnormality. Adrenals/Urinary Tract: Both adrenal glands appear normal. Possible small nonobstructing calculus in the lower pole of the right kidney. No evidence of  ureteral calculus or hydronephrosis. Bilateral renal cysts are grossly stable. The bladder appears unremarkable. Stomach/Bowel: Enteric contrast was administered and has passed into the mid colon. The stomach appears unremarkable for its degree of distension. No evidence of bowel wall thickening, distention or surrounding inflammatory change. Proximal duodenal diverticula and extensive diverticulosis of the descending and sigmoid colon are again noted. Vascular/Lymphatic: There are no enlarged abdominal or pelvic lymph nodes. Small external iliac lymph nodes are unchanged. Pelvic lymphadenectomy clips are noted. Aortic and branch vessel atherosclerosis. The portal, superior mesenteric and splenic veins are patent. Reproductive: Status post prostatectomy without recurrent mass lesion. Other: Postsurgical changes in the anterior abdominal wall. No ascites or peritoneal nodularity. Musculoskeletal: Grossly stable multifocal sclerotic osseous metastases within the lumbar spine and pelvis. Previous multilevel lumbar fusion. Stable intramuscular lipoma anterior to the right hip. IMPRESSION: 1. Grossly stable multifocal osseous metastatic disease. 2. The left lower lobe pulmonary nodular density is unchanged from the most recent study, decreased in size from study of 13 months ago, most consistent with a postinflammatory focus. No new or enlarging nodules. 3. 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 4. Aortic Atherosclerosis (ICD10-I70.0). Distal colonic diverticulosis without acute inflammation. Electronically Signed   By: Richardean Sale M.D.   On: 07/12/2020 11:15     Assessment and plan- Patient is a 86 y.o. male with castrate sensitive metastatic prostate cancer with bone metastases on Zytiga here for routine follow-up to discuss CT scan results and further management  CT chest abdomen and pelvis with contrast does not show any  evidence of progressive disease.  He has scattered areas of osseous metastatic disease which is essentially stable.  He has tolerated Zytiga well without any significant side effects.  He will continue that until progression or toxicity. Patient has been receiving Zometa for his osteopenia which she will continue to receive every 6 months.  I will see him back in 3 months with CBC with differential CMP and PSA.  PSA from today is pending.  Overall it has trended down since treatment.   Visit Diagnosis 1. Prostate cancer metastatic to bone ([place])   2. High risk medication use      Dr. Randa Evens, MD, MPH [facility] at [facility] [phone] 07/20/2020 3:42 PM

## 2020-07-24 ENCOUNTER — Other Ambulatory Visit (HOSPITAL_COMMUNITY): Payer: Self-pay

## 2020-07-24 ENCOUNTER — Other Ambulatory Visit: Payer: Self-pay | Admitting: Oncology

## 2020-07-24 DIAGNOSIS — C7951 Secondary malignant neoplasm of bone: Secondary | ICD-10-CM

## 2020-07-24 DIAGNOSIS — C61 Malignant neoplasm of prostate: Secondary | ICD-10-CM

## 2020-07-24 MED ORDER — ABIRATERONE ACETATE 250 MG PO TABS
ORAL_TABLET | Freq: Every day | ORAL | 0 refills | Status: DC
  Filled 2020-07-24: qty 120, 30d supply, fill #0

## 2020-07-27 ENCOUNTER — Other Ambulatory Visit (HOSPITAL_COMMUNITY): Payer: Self-pay

## 2020-08-18 ENCOUNTER — Other Ambulatory Visit (HOSPITAL_COMMUNITY): Payer: Self-pay

## 2020-08-21 ENCOUNTER — Other Ambulatory Visit: Payer: Self-pay | Admitting: Oncology

## 2020-08-21 ENCOUNTER — Other Ambulatory Visit (HOSPITAL_COMMUNITY): Payer: Self-pay

## 2020-08-21 DIAGNOSIS — C7951 Secondary malignant neoplasm of bone: Secondary | ICD-10-CM

## 2020-08-21 DIAGNOSIS — C61 Malignant neoplasm of prostate: Secondary | ICD-10-CM

## 2020-08-21 MED ORDER — ABIRATERONE ACETATE 250 MG PO TABS
ORAL_TABLET | Freq: Every day | ORAL | 0 refills | Status: DC
  Filled 2020-08-21: qty 120, 30d supply, fill #0

## 2020-08-24 ENCOUNTER — Other Ambulatory Visit (HOSPITAL_COMMUNITY): Payer: Self-pay

## 2020-08-29 ENCOUNTER — Emergency Department
Admission: EM | Admit: 2020-08-29 | Discharge: 2020-08-29 | Disposition: A | Discharge status: Home / Self Care | Payer: Medicare Other | Attending: Emergency Medicine | Admitting: Emergency Medicine

## 2020-08-29 ENCOUNTER — Emergency Department: Payer: Medicare Other

## 2020-08-29 ENCOUNTER — Other Ambulatory Visit: Payer: Self-pay

## 2020-08-29 DIAGNOSIS — Z79899 Other long term (current) drug therapy: Secondary | ICD-10-CM | POA: Diagnosis not present

## 2020-08-29 DIAGNOSIS — M25512 Pain in left shoulder: Secondary | ICD-10-CM | POA: Diagnosis not present

## 2020-08-29 DIAGNOSIS — R509 Fever, unspecified: Secondary | ICD-10-CM | POA: Insufficient documentation

## 2020-08-29 DIAGNOSIS — I10 Essential (primary) hypertension: Secondary | ICD-10-CM | POA: Diagnosis not present

## 2020-08-29 DIAGNOSIS — K625 Hemorrhage of anus and rectum: Secondary | ICD-10-CM | POA: Insufficient documentation

## 2020-08-29 DIAGNOSIS — Z8546 Personal history of malignant neoplasm of prostate: Secondary | ICD-10-CM | POA: Insufficient documentation

## 2020-08-29 DIAGNOSIS — Z85830 Personal history of malignant neoplasm of bone: Secondary | ICD-10-CM | POA: Diagnosis not present

## 2020-08-29 DIAGNOSIS — Z20822 Contact with and (suspected) exposure to covid-19: Secondary | ICD-10-CM | POA: Diagnosis not present

## 2020-08-29 DIAGNOSIS — R0781 Pleurodynia: Secondary | ICD-10-CM | POA: Insufficient documentation

## 2020-08-29 DIAGNOSIS — Z7901 Long term (current) use of anticoagulants: Secondary | ICD-10-CM | POA: Diagnosis not present

## 2020-08-29 DIAGNOSIS — I480 Paroxysmal atrial fibrillation: Secondary | ICD-10-CM | POA: Diagnosis not present

## 2020-08-29 DIAGNOSIS — R52 Pain, unspecified: Secondary | ICD-10-CM

## 2020-08-29 DIAGNOSIS — M25511 Pain in right shoulder: Secondary | ICD-10-CM | POA: Insufficient documentation

## 2020-08-29 LAB — HEPATIC FUNCTION PANEL
ALT: 8 U/L (ref 0–44)
AST: 18 U/L (ref 15–41)
Albumin: 3.1 g/dL — ABNORMAL LOW (ref 3.5–5.0)
Alkaline Phosphatase: 45 U/L (ref 38–126)
Bilirubin, Direct: 0.3 mg/dL — ABNORMAL HIGH (ref 0.0–0.2)
Indirect Bilirubin: 1.1 mg/dL — ABNORMAL HIGH (ref 0.3–0.9)
Total Bilirubin: 1.4 mg/dL — ABNORMAL HIGH (ref 0.3–1.2)
Total Protein: 5.6 g/dL — ABNORMAL LOW (ref 6.5–8.1)

## 2020-08-29 LAB — BASIC METABOLIC PANEL
Anion gap: 9 (ref 5–15)
BUN: 19 mg/dL (ref 8–23)
CO2: 26 mmol/L (ref 22–32)
Calcium: 10.3 mg/dL (ref 8.9–10.3)
Chloride: 102 mmol/L (ref 98–111)
Creatinine, Ser: 1.27 mg/dL — ABNORMAL HIGH (ref 0.61–1.24)
GFR, Estimated: 56 mL/min — ABNORMAL LOW (ref >60)
Glucose, Bld: 131 mg/dL — ABNORMAL HIGH (ref 70–99)
Potassium: 3.3 mmol/L — ABNORMAL LOW (ref 3.5–5.1)
Sodium: 137 mmol/L (ref 135–145)

## 2020-08-29 LAB — CBC
HCT: 32.8 % — ABNORMAL LOW (ref 39.0–52.0)
Hemoglobin: 10.6 g/dL — ABNORMAL LOW (ref 13.0–17.0)
MCH: 28.9 pg (ref 26.0–34.0)
MCHC: 32.3 g/dL (ref 30.0–36.0)
MCV: 89.4 fL (ref 80.0–100.0)
Platelets: 221 10*3/uL (ref 150–400)
RBC: 3.67 MIL/uL — ABNORMAL LOW (ref 4.22–5.81)
RDW: 14.6 % (ref 11.5–15.5)
WBC: 11.2 10*3/uL — ABNORMAL HIGH (ref 4.0–10.5)
nRBC: 0 % (ref 0.0–0.2)

## 2020-08-29 LAB — URINALYSIS, COMPLETE (UACMP) WITH MICROSCOPIC
Bacteria, UA: NONE SEEN
Bilirubin Urine: NEGATIVE
Glucose, UA: NEGATIVE mg/dL
Hgb urine dipstick: NEGATIVE
Ketones, ur: NEGATIVE mg/dL
Leukocytes,Ua: NEGATIVE
Nitrite: NEGATIVE
Protein, ur: NEGATIVE mg/dL
Specific Gravity, Urine: 1.044 — ABNORMAL HIGH (ref 1.005–1.030)
pH: 5 (ref 5.0–8.0)

## 2020-08-29 LAB — LACTIC ACID, PLASMA: Lactic Acid, Venous: 1.3 mmol/L (ref 0.5–1.9)

## 2020-08-29 LAB — RESP PANEL BY RT-PCR (FLU A&B, COVID) ARPGX2
Influenza A by PCR: NEGATIVE
Influenza B by PCR: NEGATIVE
SARS Coronavirus 2 by RT PCR: NEGATIVE

## 2020-08-29 LAB — TROPONIN I (HIGH SENSITIVITY)
Troponin I (High Sensitivity): 55 ng/L — ABNORMAL HIGH (ref <18)
Troponin I (High Sensitivity): 51 ng/L — ABNORMAL HIGH (ref <18)

## 2020-08-29 LAB — LIPASE, BLOOD: Lipase: 30 U/L (ref 11–51)

## 2020-08-29 LAB — PROCALCITONIN: Procalcitonin: <0.1 ng/mL

## 2020-08-29 MED ORDER — ACETAMINOPHEN 500 MG PO TABS
ORAL_TABLET | ORAL | Status: AC
  Administered 2020-08-29: 1000 mg via ORAL
  Filled 2020-08-29: qty 1

## 2020-08-29 MED ORDER — IOHEXOL 350 MG/ML SOLN
75.0000 mL | Freq: Once | INTRAVENOUS | Status: AC | PRN
  Administered 2020-08-29: 75 mL via INTRAVENOUS

## 2020-08-29 MED ORDER — ACETAMINOPHEN 500 MG PO TABS: 1000.0000 mg | ORAL_TABLET | Freq: Once | ORAL | Status: AC

## 2020-08-29 NOTE — Discharge Instructions (Addendum)
As we discussed your evaluation in the emergency room did not show a cause for your pain and your fever.  Therefore it is very important they follow-up with your doctor in 2 days for reevaluation.  Return to the emergency room if the pain returns, if you have another episode of fever, shortness of breath, cough, abdominal pain, or any other symptoms that are concerning to you.  Also make sure to discuss with Dr. Doy Hutching about your rectal bleeding.  You need a colonoscopy to evaluate why you are having blood in your stool.  Return to the emergency room if you have bloody stools or black stools.

## 2020-08-29 NOTE — ED Triage Notes (Signed)
Pt states he has a pain in the center of his chest when he takes a deep breath and has ahd some blood in his stool for 3-4 weeks. Pt is on eliquis.

## 2020-08-29 NOTE — ED Provider Notes (Signed)
[facility] Emergency Department Provider Note  ____________________________________________  Time seen: Approximately 3:39 AM  I have reviewed the triage vital signs and the nursing notes.   HISTORY  Chief Complaint Chest Pain   HPI Carlos Perkins is a 86 y.o. male with history of A. fib on Eliquis, hypertension, hyperlipidemia, metastatic prostate cancer on Zytiga who presents for evaluation of chest pain. Patient complaining of sharp pleuritic central chest pain radiating to his bilateral shoulder blades. The pain is worse with deep inspiration and cough. He has a mild cough, no congestion, fever, SOB, abd pain, v/d.  Denies any personal or family history of PE or DVT, recent travel immobilization, leg pain or swelling, hemoptysis.  He is on exogenous hormones for his prostate cancer.  Denies any missed dose of Eliquis.  He is also complaining of on and off rectal bleeding for several months.  He reports that he will have intermittent episodes where he noticed some blood in the toilet paper after wiping.  Never noticed any blood in his stool or blood in the toilet, no melena.  He denies any dizziness.  Past Medical History:  Diagnosis Date  . Arthritis   . Hyperlipidemia   . Hypertension   . Myocardial infarction ([place])   . Prostate cancer [facility])    Prostate CA approximately 11 years ago    Patient Active Problem List   Diagnosis Date Noted  . Pathologic compression fracture of spine ([place]) 07/20/2020  . Heart failure with reduced ejection fraction ([place]) 11/08/2019  . History of back surgery 11/06/2019  . History of MI (myocardial infarction) 11/06/2019  . Goals of care, counseling/discussion 01/15/2017  . Prostate cancer metastatic to bone ([place]) 01/14/2017  . Hypercalcemia 12/11/2016  . PAF (paroxysmal atrial fibrillation) ([place]) 12/11/2016  . Fusion of spine of lumbosacral region 12/10/2016  . Lumbar stenosis 03/17/2013  . Incomplete emptying  of bladder 03/11/2013  . Lumbar radiculopathy, acute 03/02/2013  . Hypertension 07/04/2011  . History of prostate cancer 07/02/2011  . Hyperlipidemia, unspecified 07/02/2011  . ST elevation MI (STEMI) ([place]) 04/01/2006    Past Surgical History:  Procedure Laterality Date  . APPENDECTOMY    . BACK SURGERY    . CARDIAC CATHETERIZATION     stent x 1  . CHOLECYSTECTOMY    . LUMBAR LAMINECTOMY/DECOMPRESSION MICRODISCECTOMY Left 08/20/2012   Procedure: LUMBAR LAMINECTOMY/DECOMPRESSION MICRODISCECTOMY 1 LEVEL;  Surgeon: Faythe Ghee, MD;  Location: [facility];  Service: Neurosurgery;  Laterality: Left;  lumbar four-five  . PROSTATE SURGERY    . TONSILLECTOMY      Prior to Admission medications   Medication Sig Start Date End Date Taking? Authorizing Provider  abiraterone acetate (ZYTIGA) 250 MG tablet TAKE 4 TABLETS (1,000 MG TOTAL) BY MOUTH DAILY. TAKE ON AN EMPTY STOMACH 1 HOUR BEFORE OR 2 HOURS AFTER A MEAL 08/21/20 08/21/21  Sindy Guadeloupe, MD  apixaban [name]) 5 MG TABS tablet Take by mouth. 11/25/19   [provider]  atorvastatin (LIPITOR) 10 MG tablet Take 10 mg by mouth daily.    [provider]  carvedilol (COREG) 25 MG tablet Take 25 mg by mouth 2 (two) times daily. 10/31/17   [provider]  cholecalciferol (VITAMIN D) 1000 units tablet Take 2,000 Units by mouth daily.    [provider]  cinacalcet (SENSIPAR) 30 MG tablet Take 30 mg by mouth 3 (three) times a week.    [provider]  furosemide (LASIX) 20 MG tablet Take  20 mg by mouth daily. 10/20/17   [provider]  hydrALAZINE (APRESOLINE) 25 MG tablet Take 25 mg by mouth 3 (three) times daily.    [provider]  lisinopril (PRINIVIL,ZESTRIL) 40 MG tablet Take 40 mg by mouth daily. 12/02/17   [provider]  nitroGLYCERIN (NITROSTAT) 0.4 MG SL tablet As directed 01/25/16   [provider]  predniSONE (DELTASONE) 5 MG tablet Take 1 tablet by  mouth once daily with breakfast 04/18/20   Sindy Guadeloupe, MD    Allergies Patient has no known allergies.  Family History  Problem Relation Age of Onset  . Stroke Mother   . Heart attack Father     Social History Social History   Tobacco Use  . Smoking status: Never Smoker  . Smokeless tobacco: Never Used  Vaping Use  . Vaping Use: Never used  Substance Use Topics  . Alcohol use: No  . Drug use: No    Review of Systems  Constitutional: Negative for fever. Eyes: Negative for visual changes. ENT: Negative for sore throat. Neck: No neck pain  Cardiovascular: + chest pain. Respiratory: Negative for shortness of breath. Gastrointestinal: Negative for abdominal pain, vomiting or diarrhea. Genitourinary: Negative for dysuria. + rectal bleeding Musculoskeletal: Negative for back pain. Skin: Negative for rash. Neurological: Negative for headaches, weakness or numbness. Psych: No SI or HI  ____________________________________________   PHYSICAL EXAM:  VITAL SIGNS: ED Triage Vitals  Enc Vitals Group     BP 08/29/20 0114 (!) 147/89     Pulse Rate 08/29/20 0114 77     Resp 08/29/20 0114 20     Temp 08/29/20 0114 (!) 101.1 F (38.4 C)     Temp Source 08/29/20 0114 Oral     SpO2 08/29/20 0114 93 %     Weight 08/29/20 0113 200 lb (90.7 kg)     Height 08/29/20 0113 5\' 10"  (1.778 m)     Head Circumference --      Peak Flow --      Pain Score 08/29/20 0112 8     Pain Loc --      Pain Edu? --      Excl. in [place]? --     Constitutional: Alert and oriented. Well appearing and in no apparent distress. HEENT:      Head: Normocephalic and atraumatic.         Eyes: Conjunctivae are normal. Sclera is non-icteric.       Mouth/Throat: Mucous membranes are moist.       Neck: Supple with no signs of meningismus. Cardiovascular: Regular rate and rhythm. No murmurs, gallops, or rubs. 2+ symmetrical distal pulses are present in all extremities. No JVD. Respiratory: Tachypneic,  satting mid to low 90s on RA, lungs CTAB with no wheezing or crackles.  Gastrointestinal: Soft, non tender, and non distended with positive bowel sounds. No rebound or guarding. Genitourinary: No CVA tenderness. Rectal exam showing brown stool guaiac negative Musculoskeletal:  Trace edema bilaterally, no C/T/L spine tenderness Neurologic: Normal speech and language. Face is symmetric. Moving all extremities. No gross focal neurologic deficits are appreciated. Skin: Skin is warm, dry and intact. No rash noted. Psychiatric: Mood and affect are normal. Speech and behavior are normal.  ____________________________________________   LABS (all labs ordered are listed, but only abnormal results are displayed)  Labs Reviewed  BASIC METABOLIC PANEL - Abnormal; Notable for the following components:      Result Value   Potassium 3.3 (*)  Glucose, Bld 131 (*)    Creatinine, Ser 1.27 (*)    GFR, Estimated 56 (*)    All other components within normal limits  CBC - Abnormal; Notable for the following components:   WBC 11.2 (*)    RBC 3.67 (*)    Hemoglobin 10.6 (*)    HCT 32.8 (*)    All other components within normal limits  URINALYSIS, COMPLETE (UACMP) WITH MICROSCOPIC - Abnormal; Notable for the following components:   Color, Urine YELLOW (*)    APPearance CLEAR (*)    Specific Gravity, Urine 1.044 (*)    All other components within normal limits  HEPATIC FUNCTION PANEL - Abnormal; Notable for the following components:   Total Protein 5.6 (*)    Albumin 3.1 (*)    Total Bilirubin 1.4 (*)    Bilirubin, Direct 0.3 (*)    Indirect Bilirubin 1.1 (*)    All other components within normal limits  TROPONIN I (HIGH SENSITIVITY) - Abnormal; Notable for the following components:   Troponin I (High Sensitivity) 55 (*)    All other components within normal limits  TROPONIN I (HIGH SENSITIVITY) - Abnormal; Notable for the following components:   Troponin I (High Sensitivity) 51 (*)    All other  components within normal limits  RESP PANEL BY RT-PCR (FLU A&B, COVID) ARPGX2  CULTURE, BLOOD (ROUTINE X 2)  CULTURE, BLOOD (ROUTINE X 2)  PROCALCITONIN  LIPASE, BLOOD  LACTIC ACID, PLASMA   ____________________________________________  EKG  ED ECG REPORT I, Rudene Re, the attending physician, personally viewed and interpreted this ECG.  Atrial fibrillation with rate of 71, normal QTC, normal axis, occasional PVCs, no ST elevations or depressions. ____________________________________________  RADIOLOGY  I have personally reviewed the images performed during this visit and I agree with the Radiologist's read.   Interpretation by Radiologist:  DG Chest 2 View  Result Date: 08/29/2020 CLINICAL DATA:  Chest pain EXAM: CHEST - 2 VIEW COMPARISON:  08/13/2012, CT chest 08/04/2019, 07/11/2020, bone scan 07/11/2020 FINDINGS: No focal opacity or pleural effusion. Borderline to mild cardiomegaly with aortic atherosclerosis. No pneumothorax. Degenerative changes of the spine. IMPRESSION: No active cardiopulmonary disease. Electronically Signed   By: Donavan Foil M.D.   On: 08/29/2020 01:39   CT Angio Chest PE W and/or Wo Contrast  Result Date: 08/29/2020 CLINICAL DATA:  Center chest pain EXAM: CT ANGIOGRAPHY CHEST WITH CONTRAST TECHNIQUE: Multidetector CT imaging of the chest was performed using the standard protocol during bolus administration of intravenous contrast. Multiplanar CT image reconstructions and MIPs were obtained to evaluate the vascular anatomy. CONTRAST:  72mL OMNIPAQUE IOHEXOL 350 MG/ML SOLN COMPARISON:  Chest x-ray 08/29/2020, CT chest 07/11/2020 FINDINGS: Cardiovascular: Satisfactory opacification of the pulmonary arteries to the segmental level. No evidence of pulmonary embolism. Moderate aortic atherosclerosis. No aneurysmal dilatation. Mild cardiomegaly. Coronary vascular calcification. No substantial pericardial effusion Mediastinum/Nodes: Midline trachea. No  thyroid mass. No suspicious nodes. Incompletely visualized nodule adjacent to the upper esophagus, grossly stable. No suspicious mediastinal adenopathy. Lungs/Pleura: No pleural effusion or pneumothorax. Mild subpleural reticulation consistent with fibrosis. Linear scarring left base. Pulmonary nodule in the left lower lobe measures 12 x 12 mm, previously 16 x 12 mm. Upper Abdomen: No acute abnormality is seen. Vague low-attenuation liver lesions as before. Musculoskeletal: Diffuse skeletal sclerotic metastatic disease. No acute osseous abnormality Review of the MIP images confirms the above findings. IMPRESSION: 1. Negative for acute pulmonary embolus. 2. Stable to minimal decrease in size of left lower lobe lung  nodule, favoring benign process 3. Widespread osseous metastatic disease as before Aortic Atherosclerosis (ICD10-I70.0). Electronically Signed   By: Donavan Foil M.D.   On: 08/29/2020 03:44   CT T-SPINE NO CHARGE  Result Date: 08/29/2020 CLINICAL DATA:  86 year old male with chest pain. Metastatic prostate cancer. EXAM: CT THORACIC SPINE WITH CONTRAST TECHNIQUE: Multiplanar CT images of the thoracic spine were reconstructed from contemporary CTA of the Chest. CONTRAST:  No additional COMPARISON:  CTA chest 0302 hours today reported separately. Restaging CT Chest, Abdomen, and Pelvis today are reported separately. 07/11/2020. Whole-body bone scan 07/11/2020. FINDINGS: Limited cervical spine imaging: Preserved cervicothoracic junction alignment. Bulky C6-C7 anterior endplate spurring. Thoracic spine segmentation:  Normal. Alignment:  Stable vertebral height and alignment from last month. Vertebrae: Scattered sclerotic osseous metastatic disease in the visible spine, ribs. Posterior left 4th rib, right 6th rib, and left 12th rib involvement is most pronounced. C7 and all thoracic vertebral levels are affected, with the largest sclerotic vertebral metastases at T6 and T9 measuring up to 15 mm. Compared to  the restaging CT last month, no pathologic compression fracture is identified. No new osseous abnormality. Paraspinal and other soft tissues: Chest and upper abdominal viscera details reported separately today. Thoracic paraspinal soft tissues are stable since last month. Disc levels: No CT evidence of thoracic spinal stenosis. IMPRESSION: 1. Stable CT appearance of widespread sclerotic osseous metastatic disease in the visible spine and ribs since the restaging study last month. No pathologic compression fracture or new osseous abnormality in the thoracic spine. 2. CTA chest today reported separately. Electronically Signed   By: Genevie Ann M.D.   On: 08/29/2020 05:52     ____________________________________________   PROCEDURES  Procedure(s) performed:yes .1-3 Lead EKG Interpretation Performed by: Rudene Re, MD Authorized by: Rudene Re, MD     Interpretation: abnormal     ECG rate assessment: normal     Rhythm: atrial fibrillation     Ectopy: PVCs     Critical Care performed:  None ____________________________________________   INITIAL IMPRESSION / ASSESSMENT AND PLAN / ED COURSE  86 y.o. male with history of A. fib on Eliquis, hypertension, hyperlipidemia, metastatic prostate cancer on Zytiga who presents for evaluation of central pleuritic chest pain radiating to his bilateral shoulder blades.  Patient denies any shortness of breath but does have slightly increased work of breathing with tachypnea, satting 91 to 94% on room air, lungs are clear to auscultation with no wheezing or crackles.  Normotensive with intact pulses in all 4 extremities.  Legs have trace edema bilaterally but they are symmetric.  No bony tenderness of the b/l scapulas or midline C/T/L spine. EKG showing A. fib which she has a history of. He has a fever of 101.46F here.   Ddx PNA, Covid, Flu, pericarditis, myocarditis, PE, disciitis.   Covid and flu negative.  Labs showing mild leukocytosis with  white count of 11.2.  Patient does have mild anemia and hemoglobin of 10.6 but no signs of active bleeding at this time.  No significant electrolyte derangements.  Chest x-ray visualized by me with no signs of pneumonia, confirmed by radiology.  CT angio of the chest is pending.  Patient given Tylenol for fever.  Old medical records reviewed.  Patient placed on telemetry for monitoring of cardiorespiratory status.  _________________________ 6:37 AM on 08/29/2020 -----------------------------------------  CT with no acute findings.  CT of the thoracic spine also showing no abnormalities.  No signs of infection.  Blood cultures were sent in case this  is bacteremia but with negative inflammatory markers such as procalcitonin. Lactic is pending. Anticipate discharge if that is WNL.   Possible a viral syndrome.  The rest of the work-up did not reveal any signs of infection.  Patient has remained stable with rate controlled A. fib, normal sats, normal BP.  Recommended close follow-up with his cancer doctor or PCP within 2 days for reevaluation.  We will call him if his cultures grow anything.  The chest pain resolved with Tylenol.  Recommended return to the emergency room for new or worsening chest pain, shortness of breath, or abdominal pain.  Discussed importance of follow-up with his PCP for a colonoscopy for the intermittent rectal bleeding.  There is no evidence of bleeding at this time    _____________________________________________ Please note:  Patient was evaluated in Emergency Department today for the symptoms described in the history of present illness. Patient was evaluated in the context of the global COVID-19 pandemic, which necessitated consideration that the patient might be at risk for infection with the SARS-CoV-2 virus that causes COVID-19. Institutional protocols and algorithms that pertain to the evaluation of patients at risk for COVID-19 are in a state of rapid change based on  information released by regulatory bodies including the CDC and federal and state organizations. These policies and algorithms were followed during the patient's care in the ED.  Some ED evaluations and interventions may be delayed as a result of limited staffing during the pandemic.   [place] Controlled Substance Database was reviewed by me. ____________________________________________   FINAL CLINICAL IMPRESSION(S) / ED DIAGNOSES   Final diagnoses:  Pain  Pleuritic chest pain  Fever, unspecified fever cause  Rectal bleeding      NEW MEDICATIONS STARTED DURING THIS VISIT:  ED Discharge Orders    None       Note:  This document was prepared using Dragon voice recognition software and may include unintentional dictation errors.    Rudene Re, MD 08/29/20 [phone]

## 2020-09-03 LAB — CULTURE, BLOOD (ROUTINE X 2)
Culture: NO GROWTH
Culture: NO GROWTH
Special Requests: ADEQUATE

## 2020-09-15 ENCOUNTER — Other Ambulatory Visit (HOSPITAL_COMMUNITY): Payer: Self-pay

## 2020-09-15 ENCOUNTER — Other Ambulatory Visit: Payer: Self-pay | Admitting: Oncology

## 2020-09-15 DIAGNOSIS — C7951 Secondary malignant neoplasm of bone: Secondary | ICD-10-CM

## 2020-09-15 MED ORDER — ABIRATERONE ACETATE 250 MG PO TABS
ORAL_TABLET | Freq: Every day | ORAL | 0 refills | Status: DC
  Filled 2020-09-15: qty 120, 30d supply, fill #0

## 2020-09-25 ENCOUNTER — Other Ambulatory Visit (HOSPITAL_COMMUNITY): Payer: Self-pay

## 2020-10-11 ENCOUNTER — Telehealth: Payer: Self-pay | Admitting: Pharmacy Technician

## 2020-10-11 ENCOUNTER — Other Ambulatory Visit (HOSPITAL_COMMUNITY): Payer: Self-pay

## 2020-10-11 NOTE — Telephone Encounter (Signed)
Oral Oncology Patient Advocate Encounter   Was successful in securing patient an [phone] grant from Patient [facility]) to provide copayment coverage for Zytiga.  This will keep the out of pocket expense at $0.     I have spoken with the patient.    The billing information is as follows and has been shared with [place].   Member ID: [id number] Group ID: [id number] RxBin: [id number] Dates of Eligibility: 07/13/20 through 07/12/21  Fund:  [place] Patient [place] Phone [phone] Fax [phone] 10/11/2020 4:37 PM

## 2020-10-12 ENCOUNTER — Encounter: Payer: Self-pay | Admitting: Oncology

## 2020-10-12 ENCOUNTER — Other Ambulatory Visit (HOSPITAL_COMMUNITY): Payer: Self-pay

## 2020-10-19 ENCOUNTER — Other Ambulatory Visit: Payer: Self-pay | Admitting: Oncology

## 2020-10-19 ENCOUNTER — Inpatient Hospital Stay: Payer: Medicare Other

## 2020-10-19 ENCOUNTER — Inpatient Hospital Stay (HOSPITAL_BASED_OUTPATIENT_CLINIC_OR_DEPARTMENT_OTHER): Payer: Medicare Other | Admitting: Oncology

## 2020-10-19 ENCOUNTER — Other Ambulatory Visit (HOSPITAL_COMMUNITY): Payer: Self-pay

## 2020-10-19 ENCOUNTER — Encounter: Payer: Self-pay | Admitting: Oncology

## 2020-10-19 ENCOUNTER — Inpatient Hospital Stay: Payer: Medicare Other | Attending: Oncology

## 2020-10-19 VITALS — BP 125/76 | HR 65 | Temp 98.0°F | Resp 16 | Ht 70.0 in | Wt 191.3 lb

## 2020-10-19 DIAGNOSIS — C7951 Secondary malignant neoplasm of bone: Secondary | ICD-10-CM | POA: Insufficient documentation

## 2020-10-19 DIAGNOSIS — Z79899 Other long term (current) drug therapy: Secondary | ICD-10-CM | POA: Insufficient documentation

## 2020-10-19 DIAGNOSIS — C61 Malignant neoplasm of prostate: Secondary | ICD-10-CM

## 2020-10-19 DIAGNOSIS — I10 Essential (primary) hypertension: Secondary | ICD-10-CM | POA: Diagnosis not present

## 2020-10-19 DIAGNOSIS — E785 Hyperlipidemia, unspecified: Secondary | ICD-10-CM | POA: Insufficient documentation

## 2020-10-19 DIAGNOSIS — I2510 Atherosclerotic heart disease of native coronary artery without angina pectoris: Secondary | ICD-10-CM | POA: Insufficient documentation

## 2020-10-19 DIAGNOSIS — Z5181 Encounter for therapeutic drug level monitoring: Secondary | ICD-10-CM | POA: Diagnosis not present

## 2020-10-19 DIAGNOSIS — Z7952 Long term (current) use of systemic steroids: Secondary | ICD-10-CM | POA: Insufficient documentation

## 2020-10-19 DIAGNOSIS — Z7901 Long term (current) use of anticoagulants: Secondary | ICD-10-CM | POA: Diagnosis not present

## 2020-10-19 DIAGNOSIS — M8580 Other specified disorders of bone density and structure, unspecified site: Secondary | ICD-10-CM | POA: Insufficient documentation

## 2020-10-19 DIAGNOSIS — Z79818 Long term (current) use of other agents affecting estrogen receptors and estrogen levels: Secondary | ICD-10-CM

## 2020-10-19 DIAGNOSIS — Z9049 Acquired absence of other specified parts of digestive tract: Secondary | ICD-10-CM | POA: Diagnosis not present

## 2020-10-19 DIAGNOSIS — Z7983 Long term (current) use of bisphosphonates: Secondary | ICD-10-CM

## 2020-10-19 DIAGNOSIS — I252 Old myocardial infarction: Secondary | ICD-10-CM | POA: Diagnosis not present

## 2020-10-19 LAB — COMPREHENSIVE METABOLIC PANEL
ALT: 12 U/L (ref 0–44)
AST: 16 U/L (ref 15–41)
Albumin: 4 g/dL (ref 3.5–5.0)
Alkaline Phosphatase: 67 U/L (ref 38–126)
Anion gap: 8 (ref 5–15)
BUN: 27 mg/dL — ABNORMAL HIGH (ref 8–23)
CO2: 26 mmol/L (ref 22–32)
Calcium: 10.9 mg/dL — ABNORMAL HIGH (ref 8.9–10.3)
Chloride: 102 mmol/L (ref 98–111)
Creatinine, Ser: 1.26 mg/dL — ABNORMAL HIGH (ref 0.61–1.24)
GFR, Estimated: 57 mL/min — ABNORMAL LOW (ref >60)
Glucose, Bld: 113 mg/dL — ABNORMAL HIGH (ref 70–99)
Potassium: 3.9 mmol/L (ref 3.5–5.1)
Sodium: 136 mmol/L (ref 135–145)
Total Bilirubin: 0.8 mg/dL (ref 0.3–1.2)
Total Protein: 7.1 g/dL (ref 6.5–8.1)

## 2020-10-19 LAB — CBC WITH DIFFERENTIAL/PLATELET
Abs Immature Granulocytes: 0.04 10*3/uL (ref 0.00–0.07)
Basophils Absolute: 0.1 10*3/uL (ref 0.0–0.1)
Basophils Relative: 1 %
Eosinophils Absolute: 0.1 10*3/uL (ref 0.0–0.5)
Eosinophils Relative: 1 %
HCT: 38.4 % — ABNORMAL LOW (ref 39.0–52.0)
Hemoglobin: 12.2 g/dL — ABNORMAL LOW (ref 13.0–17.0)
Immature Granulocytes: 1 %
Lymphocytes Relative: 17 %
Lymphs Abs: 1.5 10*3/uL (ref 0.7–4.0)
MCH: 27.7 pg (ref 26.0–34.0)
MCHC: 31.8 g/dL (ref 30.0–36.0)
MCV: 87.1 fL (ref 80.0–100.0)
Monocytes Absolute: 0.6 10*3/uL (ref 0.1–1.0)
Monocytes Relative: 7 %
Neutro Abs: 6.5 10*3/uL (ref 1.7–7.7)
Neutrophils Relative %: 73 %
Platelets: 211 10*3/uL (ref 150–400)
RBC: 4.41 MIL/uL (ref 4.22–5.81)
RDW: 15.3 % (ref 11.5–15.5)
WBC: 8.8 10*3/uL (ref 4.0–10.5)
nRBC: 0 % (ref 0.0–0.2)

## 2020-10-19 LAB — PSA: Prostatic Specific Antigen: <0.01 ng/mL (ref 0.00–4.00)

## 2020-10-19 MED ORDER — ZOLEDRONIC ACID 4 MG/5ML IV CONC
3.5000 mg | INTRAVENOUS | Status: DC
  Administered 2020-10-19: 3.5 mg via INTRAVENOUS
  Filled 2020-10-19: qty 4.38

## 2020-10-19 MED ORDER — LEUPROLIDE ACETATE (6 MONTH) 45 MG ~~LOC~~ KIT
45.0000 mg | PACK | Freq: Once | SUBCUTANEOUS | Status: AC
  Administered 2020-10-19: 45 mg via SUBCUTANEOUS
  Filled 2020-10-19: qty 45

## 2020-10-19 MED ORDER — SODIUM CHLORIDE 0.9 % IV SOLN
Freq: Once | INTRAVENOUS | Status: AC
  Filled 2020-10-19: qty 250

## 2020-10-19 MED ORDER — ABIRATERONE ACETATE 250 MG PO TABS
ORAL_TABLET | Freq: Every day | ORAL | 0 refills | Status: DC
  Filled 2020-10-26: qty 120, 30d supply, fill #0

## 2020-10-19 NOTE — Patient Instructions (Signed)
[facility]  Discharge Instructions: Thank you for choosing [facility] to provide your oncology and hematology care.  If you have a lab appointment with the [facility], please go directly to the [facility] and check in at the registration area.  Wear comfortable clothing and clothing appropriate for easy access to any Portacath or PICC line.   We strive to give you quality time with your provider. You may need to reschedule your appointment if you arrive late (15 or more minutes).  Arriving late affects you and other patients whose appointments are after yours.  Also, if you miss three or more appointments without notifying the office, you may be dismissed from the clinic at the provider's discretion.      For prescription refill requests, have your pharmacy contact our office and allow 72 hours for refills to be completed.    Today you received the following chemotherapy and/or immunotherapy agents Zometa & Eligard   To help prevent nausea and vomiting after your treatment, we encourage you to take your nausea medication as directed.  BELOW ARE SYMPTOMS THAT SHOULD BE REPORTED IMMEDIATELY: *FEVER GREATER THAN 100.4 F (38 C) OR HIGHER *CHILLS OR SWEATING *NAUSEA AND VOMITING THAT IS NOT CONTROLLED WITH YOUR NAUSEA MEDICATION *UNUSUAL SHORTNESS OF BREATH *UNUSUAL BRUISING OR BLEEDING *URINARY PROBLEMS (pain or burning when urinating, or frequent urination) *BOWEL PROBLEMS (unusual diarrhea, constipation, pain near the anus) TENDERNESS IN MOUTH AND THROAT WITH OR WITHOUT PRESENCE OF ULCERS (sore throat, sores in mouth, or a toothache) UNUSUAL RASH, SWELLING OR PAIN  UNUSUAL VAGINAL DISCHARGE OR ITCHING   Items with * indicate a potential emergency and should be followed up as soon as possible or go to the Emergency Department if any problems should occur.  Please show the CHEMOTHERAPY ALERT CARD or IMMUNOTHERAPY ALERT CARD at  check-in to the Emergency Department and triage nurse.  Should you have questions after your visit or need to cancel or reschedule your appointment, please contact [place] [facility]  [phone] and follow the prompts.  Office hours are 8:00 a.m. to 4:30 p.m. Monday - Friday. Please note that voicemails left after 4:00 p.m. may not be returned until the following business day.  We are closed weekends and major holidays. You have access to a nurse at all times for urgent questions. Please call the main number to the clinic [phone] and follow the prompts.  For any non-urgent questions, you may also contact your provider using MyChart. We now offer e-Visits for anyone 51 and older to request care online for non-urgent symptoms. For details visit [email or web address].   Also download the MyChart app! Go to the app store, search "MyChart", open the app, select [facility], and log in with your MyChart username and password.  Due to Covid, a mask is required upon entering the hospital/clinic. If you do not have a mask, one will be given to you upon arrival. For doctor visits, patients may have 1 support person aged 39 or older with them. For treatment visits, patients cannot have anyone with them due to current Covid guidelines and our immunocompromised population.

## 2020-10-19 NOTE — Progress Notes (Signed)
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 He also has a past history of prostate cancer about 15 years ago (Gleasons score 8) and was treated with prostatectomy at that time. He did not receive any radiation treatment and does not remember if his PSA was subsequently monitored. PSA in March 2015 was 7.9 and in 2014 was 6.7 after prostatectomy. He was last seen by medical oncology Dr. Everlene Other in 2015 and at that time his PSA was 9.2 previously was supposed to follow-up after 2 months and ADT was supposed to be started at that time the patient was lost to follow-up.   He was seen by Dr. Golden Pop and his primary care doctor he saw until about 2 years ago. He has had ongoing chronic back pain and has had back surgery in the past but was having worsening back pain over the last few months and was seen by Dr. Raul Del.   MRI of the lumbar spine on 11/22/2016 revealed extensive osseous metastatic disease with pathologic compression fracture of the L3 vertebral body. Posteriorly displace fragments and metastatic soft tissue result in severe canal stenosis and compression of the thecal sac at this level.   CT chest abdomen and pelvis on 11/25/2016 did not reveal any  evidence of metastatic disease other than his bones   Patient had decompression surgery of his L3 vertebral body which showed:Metastatic adenocarcinoma with features consistent with primary prostatic origin. See note.  Note: The patient history of previously diagnosed prostate carcinoma is noted. Immunoperoxidase stains were performed on formalin-fixed (non-decalcified) paraffin-embedded tissue in block A3 with appropriate positive controls. The tumor cells stain with CK8/18, PROSTATIC SPECIFIC ANTIGEN, PROSTATIC ACID PHOSPHATASE and P501S. The tumor cells do not stain with CYTOKERATIN 7, CYTOKERATIN 20, CK5/6, P40, GATA-3, CDX-2 or THYROID TRANSCRIPTION FACTOR (TTF-1). These findings are in support of the interpretation of metastatic adenocarcinoma with features consistent with primary prostatic origin.   Patient also has primary hyperparathyroidism and hypercalcemia secondary to it for which he sees Dr[facility]Akhter from [facility] and has been referred to surgery for the same   Abiraterone was started for high risk castrate sensitive prostate cancer on 02/14/2017.  Baseline bone density showed osteopenia with a T score of -1.9 at the right femur neck.      Interval history-patient has episodes of dizzy spells and palpitations which was evaluated by cardiology.Went through cardiac ablation which did not control his symptoms.  Patient is presently on Eliquis for his A. fib along with Coreg lisinopril and Aldactone.  Aldactone has helped his leg swelling as well.    ECOG PS- 1 Pain scale- 0   Review of systems- Review of Systems  Constitutional:  Positive for malaise/fatigue. Negative for chills, fever and weight loss.  HENT:  Negative for congestion, ear discharge and nosebleeds.   Eyes:  Negative for blurred vision.  Respiratory:  Negative for cough, hemoptysis, sputum production, shortness of breath and wheezing.   Cardiovascular:  Negative for chest pain, palpitations, orthopnea and claudication.   Gastrointestinal:  Negative for abdominal pain, blood in stool, constipation, diarrhea, heartburn, melena, nausea and vomiting.  Genitourinary:  Negative for dysuria, flank pain, frequency, hematuria and urgency.  Musculoskeletal:  Negative for back pain, joint pain and myalgias.  Skin:  Negative for rash.  Neurological:  Negative for dizziness, tingling, focal weakness, seizures, weakness and headaches.  Endo/Heme/Allergies:  Does not bruise/bleed easily.  Psychiatric/Behavioral:  Negative for depression and suicidal ideas. The patient does not have insomnia.       No Known Allergies   Past Medical History:  Diagnosis Date   Arthritis    Hyperlipidemia    Hypertension    Myocardial infarction [facility])    Prostate cancer ([place])    Prostate CA approximately 84 years ago     Past Surgical History:  Procedure Laterality Date   APPENDECTOMY     BACK SURGERY     CARDIAC CATHETERIZATION     stent x 1   CHOLECYSTECTOMY     LUMBAR LAMINECTOMY/DECOMPRESSION MICRODISCECTOMY Left 08/20/2012   Procedure: LUMBAR LAMINECTOMY/DECOMPRESSION MICRODISCECTOMY 1 LEVEL;  Surgeon: Faythe Ghee, MD;  Location: [facility];  Service: Neurosurgery;  Laterality: Left;  lumbar four-five   PROSTATE SURGERY     TONSILLECTOMY      Social History   Socioeconomic History   Marital status: Married    Spouse name: Not on file   Number of children: Not on file   Years of education: Not on file   Highest education level: Not on file  Occupational History   Not on file  Tobacco Use   Smoking status: Never   Smokeless tobacco: Never  Vaping Use   Vaping Use: Never used  Substance and Sexual Activity   Alcohol use: No   Drug use: No   Sexual activity: Not Currently  Other Topics Concern   Not on file  Social History Narrative   Not on file   Social Determinants of Health   Financial Resource Strain: Not on file  Food Insecurity: Not on file  Transportation Needs: Not on file  Physical  Activity: Not on file  Stress: Not on file  Social Connections: Not on file  Intimate Partner Violence: Not on file    Family History  Problem Relation Age of Onset   Stroke Mother    Heart attack Father      Current Outpatient Medications:    apixaban (ELIQUIS) 5 MG TABS tablet, Take by mouth., Disp: , Rfl:    atorvastatin (LIPITOR) 10 MG tablet, Take 10 mg by mouth daily., Disp: , Rfl:    carvedilol (COREG) 25 MG tablet, Take 25 mg by mouth daily., Disp: , Rfl:    cholecalciferol (VITAMIN D) 1000 units tablet, Take 2,000 Units by mouth daily., Disp: , Rfl:    cinacalcet (SENSIPAR) 30 MG tablet, Take 30 mg by mouth 3 (three) times a week., Disp: , Rfl:    furosemide (LASIX) 20 MG tablet, Take 20 mg by mouth daily., Disp: , Rfl: 11   hydrALAZINE (APRESOLINE) 25 MG tablet, Take 25 mg by mouth 3 (three) times daily., Disp: , Rfl:    lisinopril (PRINIVIL,ZESTRIL) 40 MG tablet, Take 40 mg by mouth daily., Disp: , Rfl: 3   nitroGLYCERIN (NITROSTAT) 0.4 MG SL tablet, Place 0.4 mg under the tongue every 5 (five) minutes as needed., Disp: , Rfl:  predniSONE (DELTASONE) 5 MG tablet, Take 1 tablet by mouth once daily with breakfast, Disp: 90 tablet, Rfl: 0   spironolactone (ALDACTONE) 25 MG tablet, Take 25 mg by mouth daily., Disp: , Rfl:    abiraterone acetate (ZYTIGA) 250 MG tablet, TAKE 4 TABLETS (1,000 MG TOTAL) BY MOUTH DAILY. TAKE ON AN EMPTY STOMACH 1 HOUR BEFORE OR 2 HOURS AFTER A MEAL, Disp: 120 tablet, Rfl: 0 No current facility-administered medications for this visit.  Facility-Administered Medications Ordered in Other Visits:    zolendronic acid (ZOMETA) 3.5 mg in sodium chloride 0.9 % 100 mL IVPB, 3.5 mg, Intravenous, Q6 months, Sindy Guadeloupe, MD, Stopped at 10/19/20 1142  Physical exam:  Vitals:   10/19/20 1012  BP: 125/76  Pulse: 65  Resp: 16  Temp: 98 F (36.7 C)  TempSrc: Oral  Weight: 191 lb 4.8 oz (86.8 kg)  Height: 5\' 10"  (1.778 m)   Physical  Exam Constitutional:      General: He is not in acute distress. Cardiovascular:     Rate and Rhythm: Normal rate and regular rhythm.     Heart sounds: Normal heart sounds.  Pulmonary:     Effort: Pulmonary effort is normal.     Breath sounds: Normal breath sounds.  Abdominal:     General: Bowel sounds are normal.     Palpations: Abdomen is soft.  Skin:    General: Skin is warm and dry.  Neurological:     Mental Status: He is alert and oriented to person, place, and time.     CMP Latest Ref Rng & Units 10/19/2020  Glucose 70 - 99 mg/dL 113(H)  BUN 8 - 23 mg/dL 27(H)  Creatinine 0.61 - 1.24 mg/dL 1.26(H)  Sodium 135 - 145 mmol/L 136  Potassium 3.5 - 5.1 mmol/L 3.9  Chloride 98 - 111 mmol/L 102  CO2 22 - 32 mmol/L 26  Calcium 8.9 - 10.3 mg/dL 10.9(H)  Total Protein 6.5 - 8.1 g/dL 7.1  Total Bilirubin 0.3 - 1.2 mg/dL 0.8  Alkaline Phos 38 - 126 U/L 67  AST 15 - 41 U/L 16  ALT 0 - 44 U/L 12   CBC Latest Ref Rng & Units 10/19/2020  WBC 4.0 - 10.5 K/uL 8.8  Hemoglobin 13.0 - 17.0 g/dL 12.2(L)  Hematocrit 39.0 - 52.0 % 38.4(L)  Platelets 150 - 400 K/uL 211     Assessment and plan- Patient is a 86 y.o. male with castrate sensitive metastatic prostate cancer with bone metastases on Zytiga.  He is here for routine follow-up  Patient is responded to treatment very well so far and his PSA remains undetectable.  He will get his next dose of Lupron today.  PSA from today is pending.  He will continue Zytiga along with prednisone until progression or toxicity.  Patient has been receiving Zometa every 6 months for his osteopenia.  He also has longstanding hypercalcemia secondary to possible parathyroid issues for which she has seen endocrinology unrelated to his prostate cancer.  He will receive his next dose of Zometa today  CBC with differential CMP and PSA in 3 months and I will see him thereafter   Visit Diagnosis 1. Prostate cancer metastatic to bone ([place])   2. High risk  medication use   3. Encounter for monitoring zoledronic acid therapy   4. Encounter for monitoring Lupron therapy      Dr. Randa Evens, MD, MPH [facility] at [facility] [phone] 10/19/2020 4:06 PM

## 2020-10-19 NOTE — Progress Notes (Signed)
Pt states that they tried to get cardioversion and it did not work. It was done because pt was passing out. His meds changed to the coreg just once a day in pm, added aldactone 1/2 pill a day. He does not want to use cane or walker because he gets sweat beads and knows its coming and feels that if he falls he will land on the walker or cane and make it worse. I told him that it would be best to walk with someone so that if he had the beads of sweat then that person could help him to chair or couch or hold the person to the floor without hurting him. From prostate cancer he is feeling good. Drinking water and gatorade and has good appetite

## 2020-10-23 ENCOUNTER — Other Ambulatory Visit (HOSPITAL_COMMUNITY): Payer: Self-pay

## 2020-10-24 ENCOUNTER — Other Ambulatory Visit (HOSPITAL_COMMUNITY): Payer: Self-pay

## 2020-10-26 ENCOUNTER — Other Ambulatory Visit (HOSPITAL_COMMUNITY): Payer: Self-pay

## 2020-10-30 ENCOUNTER — Other Ambulatory Visit (HOSPITAL_COMMUNITY): Payer: Self-pay

## 2020-11-20 ENCOUNTER — Other Ambulatory Visit: Payer: Self-pay | Admitting: Oncology

## 2020-11-20 ENCOUNTER — Other Ambulatory Visit (HOSPITAL_COMMUNITY): Payer: Self-pay

## 2020-11-20 DIAGNOSIS — C61 Malignant neoplasm of prostate: Secondary | ICD-10-CM

## 2020-11-20 DIAGNOSIS — C7951 Secondary malignant neoplasm of bone: Secondary | ICD-10-CM

## 2020-11-20 MED ORDER — ABIRATERONE ACETATE 250 MG PO TABS
ORAL_TABLET | Freq: Every day | ORAL | 0 refills | Status: DC
Start: 2020-11-20 — End: 2020-12-20
  Filled 2020-11-27: qty 120, 30d supply, fill #0

## 2020-11-27 ENCOUNTER — Other Ambulatory Visit (HOSPITAL_COMMUNITY): Payer: Self-pay

## 2020-12-20 ENCOUNTER — Other Ambulatory Visit (HOSPITAL_COMMUNITY): Payer: Self-pay

## 2020-12-20 ENCOUNTER — Other Ambulatory Visit: Payer: Self-pay | Admitting: Oncology

## 2020-12-20 DIAGNOSIS — C7951 Secondary malignant neoplasm of bone: Secondary | ICD-10-CM

## 2020-12-20 DIAGNOSIS — C61 Malignant neoplasm of prostate: Secondary | ICD-10-CM

## 2020-12-20 MED ORDER — ABIRATERONE ACETATE 250 MG PO TABS
ORAL_TABLET | Freq: Every day | ORAL | 0 refills | Status: DC
  Filled 2020-12-20: qty 120, 30d supply, fill #0

## 2020-12-25 ENCOUNTER — Other Ambulatory Visit (HOSPITAL_COMMUNITY): Payer: Self-pay

## 2021-01-17 ENCOUNTER — Other Ambulatory Visit (HOSPITAL_COMMUNITY): Payer: Self-pay

## 2021-01-17 ENCOUNTER — Telehealth: Payer: Self-pay | Admitting: Pharmacy Technician

## 2021-01-17 ENCOUNTER — Encounter: Payer: Self-pay | Admitting: Oncology

## 2021-01-19 ENCOUNTER — Inpatient Hospital Stay: Payer: Medicare Other | Attending: Oncology

## 2021-01-19 ENCOUNTER — Inpatient Hospital Stay (HOSPITAL_BASED_OUTPATIENT_CLINIC_OR_DEPARTMENT_OTHER): Payer: Medicare Other | Admitting: Oncology

## 2021-01-19 ENCOUNTER — Other Ambulatory Visit (HOSPITAL_COMMUNITY): Payer: Self-pay

## 2021-01-19 ENCOUNTER — Other Ambulatory Visit: Payer: Self-pay

## 2021-01-19 ENCOUNTER — Encounter: Payer: Self-pay | Admitting: Oncology

## 2021-01-19 VITALS — BP 112/74 | HR 91 | Temp 98.2°F | Resp 17 | Wt 196.0 lb

## 2021-01-19 DIAGNOSIS — Z79899 Other long term (current) drug therapy: Secondary | ICD-10-CM | POA: Insufficient documentation

## 2021-01-19 DIAGNOSIS — Z7901 Long term (current) use of anticoagulants: Secondary | ICD-10-CM | POA: Insufficient documentation

## 2021-01-19 DIAGNOSIS — Z7952 Long term (current) use of systemic steroids: Secondary | ICD-10-CM | POA: Insufficient documentation

## 2021-01-19 DIAGNOSIS — C7951 Secondary malignant neoplasm of bone: Secondary | ICD-10-CM | POA: Insufficient documentation

## 2021-01-19 DIAGNOSIS — M8580 Other specified disorders of bone density and structure, unspecified site: Secondary | ICD-10-CM | POA: Insufficient documentation

## 2021-01-19 DIAGNOSIS — C61 Malignant neoplasm of prostate: Secondary | ICD-10-CM | POA: Diagnosis not present

## 2021-01-19 DIAGNOSIS — E210 Primary hyperparathyroidism: Secondary | ICD-10-CM | POA: Diagnosis not present

## 2021-01-19 LAB — CBC WITH DIFFERENTIAL/PLATELET
Abs Immature Granulocytes: 0.03 10*3/uL (ref 0.00–0.07)
Basophils Absolute: 0 10*3/uL (ref 0.0–0.1)
Basophils Relative: 0 %
Eosinophils Absolute: 0.1 10*3/uL (ref 0.0–0.5)
Eosinophils Relative: 2 %
HCT: 35.5 % — ABNORMAL LOW (ref 39.0–52.0)
Hemoglobin: 11.4 g/dL — ABNORMAL LOW (ref 13.0–17.0)
Immature Granulocytes: 0 %
Lymphocytes Relative: 20 %
Lymphs Abs: 1.5 10*3/uL (ref 0.7–4.0)
MCH: 28.9 pg (ref 26.0–34.0)
MCHC: 32.1 g/dL (ref 30.0–36.0)
MCV: 89.9 fL (ref 80.0–100.0)
Monocytes Absolute: 0.4 10*3/uL (ref 0.1–1.0)
Monocytes Relative: 5 %
Neutro Abs: 5.2 10*3/uL (ref 1.7–7.7)
Neutrophils Relative %: 73 %
Platelets: 231 10*3/uL (ref 150–400)
RBC: 3.95 MIL/uL — ABNORMAL LOW (ref 4.22–5.81)
RDW: 15.6 % — ABNORMAL HIGH (ref 11.5–15.5)
WBC: 7.2 10*3/uL (ref 4.0–10.5)
nRBC: 0 % (ref 0.0–0.2)

## 2021-01-19 LAB — COMPREHENSIVE METABOLIC PANEL
ALT: 12 U/L (ref 0–44)
AST: 17 U/L (ref 15–41)
Albumin: 3.9 g/dL (ref 3.5–5.0)
Alkaline Phosphatase: 65 U/L (ref 38–126)
Anion gap: 7 (ref 5–15)
BUN: 31 mg/dL — ABNORMAL HIGH (ref 8–23)
CO2: 28 mmol/L (ref 22–32)
Calcium: 11.2 mg/dL — ABNORMAL HIGH (ref 8.9–10.3)
Chloride: 102 mmol/L (ref 98–111)
Creatinine, Ser: 1.24 mg/dL (ref 0.61–1.24)
GFR, Estimated: 57 mL/min — ABNORMAL LOW (ref >60)
Glucose, Bld: 122 mg/dL — ABNORMAL HIGH (ref 70–99)
Potassium: 4.2 mmol/L (ref 3.5–5.1)
Sodium: 137 mmol/L (ref 135–145)
Total Bilirubin: 0.5 mg/dL (ref 0.3–1.2)
Total Protein: 7 g/dL (ref 6.5–8.1)

## 2021-01-19 LAB — PSA: Prostatic Specific Antigen: 0.02 ng/mL (ref 0.00–4.00)

## 2021-01-19 NOTE — Telephone Encounter (Signed)
Oral Oncology Patient Advocate Encounter  Was successful in securing patient a $8,000 grant from [employer] to provide copayment coverage for [employer].  This will keep the out of pocket expense at $0.     [employer] ID: [id number]   The billing information is as follows and has been shared with with [place].    RxBin: [id number] PCN: [id number] Member ID: [id number] Group ID: [id number] Dates of Eligibility: 12/18/20 through 12/17/21  Fund:  [place] [facility] [place] Phone [phone] Fax [phone] 01/19/2021 11:31 AM

## 2021-01-22 ENCOUNTER — Other Ambulatory Visit (HOSPITAL_COMMUNITY): Payer: Self-pay

## 2021-01-26 ENCOUNTER — Other Ambulatory Visit: Payer: Self-pay | Admitting: Oncology

## 2021-01-26 ENCOUNTER — Other Ambulatory Visit (HOSPITAL_COMMUNITY): Payer: Self-pay

## 2021-01-26 DIAGNOSIS — C7951 Secondary malignant neoplasm of bone: Secondary | ICD-10-CM

## 2021-01-26 DIAGNOSIS — C61 Malignant neoplasm of prostate: Secondary | ICD-10-CM

## 2021-01-26 MED ORDER — ABIRATERONE ACETATE 250 MG PO TABS
ORAL_TABLET | Freq: Every day | ORAL | 0 refills | Status: DC
Start: 2021-01-26 — End: 2021-02-15
  Filled 2021-01-26: qty 120, 30d supply, fill #0

## 2021-01-28 ENCOUNTER — Encounter: Payer: Self-pay | Admitting: Oncology

## 2021-01-28 NOTE — Progress Notes (Signed)
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 He also has a past history of prostate cancer about 15 years ago (Gleasons score 8) and was treated with prostatectomy at that time. He did not receive any radiation treatment and does not remember if his PSA was subsequently monitored. PSA in March 2015 was 7.9 and in 2014 was 6.7 after prostatectomy. He was last seen by medical oncology Dr. Everlene Other in 2015 and at that time his PSA was 9.2 previously was supposed to follow-up after 2 months and ADT was supposed to be started at that time the patient was lost to follow-up.   He was seen by Dr. Golden Pop and his primary care doctor he saw until about 2 years ago. He has had ongoing chronic back pain and has had back surgery in the past but was having worsening back pain over the last few months and was seen by Dr. Raul Del.   MRI of the lumbar spine on 11/22/2016 revealed extensive osseous metastatic disease with pathologic compression fracture of the L3 vertebral body. Posteriorly displace fragments and metastatic soft tissue result in severe canal stenosis and compression of the thecal sac at this level.   CT chest abdomen and pelvis on 11/25/2016 did not reveal any evidence of metastatic disease other than his  bones   Patient had decompression surgery of his L3 vertebral body which showed:Metastatic adenocarcinoma with features consistent with primary prostatic origin. See note.  Note: The patient history of previously diagnosed prostate carcinoma is noted. Immunoperoxidase stains were performed on formalin-fixed (non-decalcified) paraffin-embedded tissue in block A3 with appropriate positive controls. The tumor cells stain with CK8/18, PROSTATIC SPECIFIC ANTIGEN, PROSTATIC ACID PHOSPHATASE and P501S. The tumor cells do not stain with CYTOKERATIN 7, CYTOKERATIN 20, CK5/6, P40, GATA-3, CDX-2 or THYROID TRANSCRIPTION FACTOR (TTF-1). These findings are in support of the interpretation of metastatic adenocarcinoma with features consistent with primary prostatic origin.   Patient also has primary hyperparathyroidism and hypercalcemia secondary to it for which he sees Dr.Akhter from [facility] and has been referred to surgery for the same   Abiraterone was started for high risk castrate sensitive prostate cancer on 02/14/2017.  Baseline bone density showed osteopenia with a T score of -1.9 at the right femur neck.      Interval history-tolerating Zytiga well so far.  Has baseline fatigue which is unchanged.  Denies any new aches and pains.  Leg swelling also fluctuates for which she is on Aldactone.  ECOG PS- 1 Pain scale- 0   Review of systems- Review of Systems  Constitutional:  Negative for chills, fever, malaise/fatigue and weight loss.  HENT:  Negative for congestion, ear discharge and nosebleeds.   Eyes:  Negative for blurred vision.  Respiratory:  Negative for cough, hemoptysis, sputum production, shortness of breath and wheezing.   Cardiovascular:  Negative for chest pain, palpitations, orthopnea and claudication.  Gastrointestinal:  Negative  for abdominal pain, blood in stool, constipation, diarrhea, heartburn, melena, nausea and vomiting.  Genitourinary:  Negative for dysuria, flank pain,  frequency, hematuria and urgency.  Musculoskeletal:  Negative for back pain, joint pain and myalgias.  Skin:  Negative for rash.  Neurological:  Negative for dizziness, tingling, focal weakness, seizures, weakness and headaches.  Endo/Heme/Allergies:  Does not bruise/bleed easily.  Psychiatric/Behavioral:  Negative for depression and suicidal ideas. The patient does not have insomnia.      No Known Allergies   Past Medical History:  Diagnosis Date   Arthritis    Hyperlipidemia    Hypertension    Myocardial infarction [facility])    Prostate cancer ([place])    Prostate CA approximately 11 years ago     Past Surgical History:  Procedure Laterality Date   APPENDECTOMY     BACK SURGERY     CARDIAC CATHETERIZATION     stent x 1   CHOLECYSTECTOMY     LUMBAR LAMINECTOMY/DECOMPRESSION MICRODISCECTOMY Left 08/20/2012   Procedure: LUMBAR LAMINECTOMY/DECOMPRESSION MICRODISCECTOMY 1 LEVEL;  Surgeon: Faythe Ghee, MD;  Location: [facility];  Service: Neurosurgery;  Laterality: Left;  lumbar four-five   PROSTATE SURGERY     TONSILLECTOMY      Social History   Socioeconomic History   Marital status: Married    Spouse name: Not on file   Number of children: Not on file   Years of education: Not on file   Highest education level: Not on file  Occupational History   Not on file  Tobacco Use   Smoking status: Never   Smokeless tobacco: Never  Vaping Use   Vaping Use: Never used  Substance and Sexual Activity   Alcohol use: No   Drug use: No   Sexual activity: Not Currently  Other Topics Concern   Not on file  Social History Narrative   Not on file   Social Determinants of Health   Financial Resource Strain: Not on file  Food Insecurity: Not on file  Transportation Needs: Not on file  Physical Activity: Not on file  Stress: Not on file  Social Connections: Not on file  Intimate Partner Violence: Not on file    Family History  Problem Relation Age of Onset   Stroke  Mother    Heart attack Father      Current Outpatient Medications:    apixaban (ELIQUIS) 5 MG TABS tablet, Take by mouth., Disp: , Rfl:    atorvastatin (LIPITOR) 10 MG tablet, Take 10 mg by mouth daily., Disp: , Rfl:    carvedilol (COREG) 25 MG tablet, Take 25 mg by mouth daily., Disp: , Rfl:    cholecalciferol (VITAMIN D) 1000 units tablet, Take 2,000 Units by mouth daily., Disp: , Rfl:    cinacalcet (SENSIPAR) 30 MG tablet, Take 30 mg by mouth 3 (three) times a week., Disp: , Rfl:    furosemide (LASIX) 20 MG tablet, Take 20 mg by mouth daily., Disp: , Rfl: 11   lisinopril (PRINIVIL,ZESTRIL) 40 MG tablet, Take 40 mg by mouth daily., Disp: , Rfl: 3   nitroGLYCERIN (NITROSTAT) 0.4 MG SL tablet, Place 0.4 mg under the tongue every 5 (five) minutes as needed., Disp: , Rfl:    predniSONE (DELTASONE) 5 MG tablet, Take 1 tablet by mouth once daily with breakfast, Disp: 90 tablet, Rfl: 0   spironolactone (ALDACTONE) 25 MG tablet, Take 25 mg by mouth daily., Disp: , Rfl:    abiraterone acetate (ZYTIGA) 250 MG tablet, TAKE 4  TABLETS (1,000 MG TOTAL) BY MOUTH DAILY. TAKE ON AN EMPTY STOMACH 1 HOUR BEFORE OR 2 HOURS AFTER A MEAL, Disp: 120 tablet, Rfl: 0   hydrALAZINE (APRESOLINE) 25 MG tablet, Take 25 mg by mouth 3 (three) times daily. (Patient not taking: Reported on 01/19/2021), Disp: , Rfl:   Physical exam:  Vitals:   01/19/21 1009  BP: 112/74  Pulse: 91  Resp: 17  Temp: 98.2 F (36.8 C)  SpO2: 100%  Weight: 196 lb (88.9 kg)   Physical Exam Constitutional:      General: He is not in acute distress. Cardiovascular:     Rate and Rhythm: Normal rate and regular rhythm.     Heart sounds: Normal heart sounds.  Pulmonary:     Effort: Pulmonary effort is normal.     Breath sounds: Normal breath sounds.  Abdominal:     General: Bowel sounds are normal.     Palpations: Abdomen is soft.  Skin:    General: Skin is warm and dry.  Neurological:     Mental Status: He is alert and oriented to  person, place, and time.     CMP Latest Ref Rng & Units 01/19/2021  Glucose 70 - 99 mg/dL 122(H)  BUN 8 - 23 mg/dL 31(H)  Creatinine 0.61 - 1.24 mg/dL 1.24  Sodium 135 - 145 mmol/L 137  Potassium 3.5 - 5.1 mmol/L 4.2  Chloride 98 - 111 mmol/L 102  CO2 22 - 32 mmol/L 28  Calcium 8.9 - 10.3 mg/dL 11.2(H)  Total Protein 6.5 - 8.1 g/dL 7.0  Total Bilirubin 0.3 - 1.2 mg/dL 0.5  Alkaline Phos 38 - 126 U/L 65  AST 15 - 41 U/L 17  ALT 0 - 44 U/L 12   CBC Latest Ref Rng & Units 01/19/2021  WBC 4.0 - 10.5 K/uL 7.2  Hemoglobin 13.0 - 17.0 g/dL 11.4(L)  Hematocrit 39.0 - 52.0 % 35.5(L)  Platelets 150 - 400 K/uL 231     Assessment and plan- Patient is a 86 y.o. male with castrate sensitive metastatic prostate cancer with bone metastases on Zytiga here for routine follow-up  PSA on most occasions has been undetectable but sometimes drifts up to 2.02.  Overall no clear rising trend in his PSA.  He received Zometa as well as Lupron 3 months ago and will be due for his next dose after 3 months.Tolerating Zytiga well without any significant side effects.  I will see him back in 3 months with labs CBC with differential CMP PSA with CT chest abdomen and pelvis with contrast and bone scan prior.  He will also receive Zometa and Lupron on that day. I will order bone density at my next visit   Visit Diagnosis 1. Prostate cancer metastatic to bone ([place])   2. High risk medication use      Dr. Randa Evens, MD, MPH [facility] at [facility] [phone] 01/28/2021 8:49 AM

## 2021-01-29 ENCOUNTER — Other Ambulatory Visit (HOSPITAL_COMMUNITY): Payer: Self-pay

## 2021-02-14 ENCOUNTER — Emergency Department
Admission: EM | Admit: 2021-02-14 | Discharge: 2021-02-14 | Disposition: A | Discharge status: Home / Self Care | Payer: Medicare Other | Attending: Emergency Medicine | Admitting: Emergency Medicine

## 2021-02-14 ENCOUNTER — Emergency Department: Payer: Medicare Other

## 2021-02-14 ENCOUNTER — Other Ambulatory Visit (HOSPITAL_COMMUNITY): Payer: Self-pay

## 2021-02-14 DIAGNOSIS — Z79899 Other long term (current) drug therapy: Secondary | ICD-10-CM | POA: Insufficient documentation

## 2021-02-14 DIAGNOSIS — E860 Dehydration: Secondary | ICD-10-CM | POA: Diagnosis not present

## 2021-02-14 DIAGNOSIS — R197 Diarrhea, unspecified: Secondary | ICD-10-CM | POA: Insufficient documentation

## 2021-02-14 DIAGNOSIS — Z8546 Personal history of malignant neoplasm of prostate: Secondary | ICD-10-CM | POA: Insufficient documentation

## 2021-02-14 DIAGNOSIS — I10 Essential (primary) hypertension: Secondary | ICD-10-CM | POA: Insufficient documentation

## 2021-02-14 DIAGNOSIS — I480 Paroxysmal atrial fibrillation: Secondary | ICD-10-CM | POA: Diagnosis not present

## 2021-02-14 DIAGNOSIS — Z7901 Long term (current) use of anticoagulants: Secondary | ICD-10-CM | POA: Diagnosis not present

## 2021-02-14 LAB — LACTIC ACID, PLASMA
Lactic Acid, Venous: 3.2 mmol/L (ref 0.5–1.9)
Lactic Acid, Venous: 1.5 mmol/L (ref 0.5–1.9)

## 2021-02-14 LAB — CBC WITH DIFFERENTIAL/PLATELET
Abs Immature Granulocytes: 0.01 10*3/uL (ref 0.00–0.07)
Basophils Absolute: 0 10*3/uL (ref 0.0–0.1)
Basophils Relative: 0 %
Eosinophils Absolute: 0.2 10*3/uL (ref 0.0–0.5)
Eosinophils Relative: 3 %
HCT: 37.6 % — ABNORMAL LOW (ref 39.0–52.0)
Hemoglobin: 12.8 g/dL — ABNORMAL LOW (ref 13.0–17.0)
Immature Granulocytes: 0 %
Lymphocytes Relative: 26 %
Lymphs Abs: 2.1 10*3/uL (ref 0.7–4.0)
MCH: 29 pg (ref 26.0–34.0)
MCHC: 34 g/dL (ref 30.0–36.0)
MCV: 85.3 fL (ref 80.0–100.0)
Monocytes Absolute: 0.4 10*3/uL (ref 0.1–1.0)
Monocytes Relative: 6 %
Neutro Abs: 5.1 10*3/uL (ref 1.7–7.7)
Neutrophils Relative %: 65 %
Platelets: 310 10*3/uL (ref 150–400)
RBC: 4.41 MIL/uL (ref 4.22–5.81)
RDW: 14.2 % (ref 11.5–15.5)
WBC: 7.8 10*3/uL (ref 4.0–10.5)
nRBC: 0 % (ref 0.0–0.2)

## 2021-02-14 LAB — BASIC METABOLIC PANEL
Anion gap: 8 (ref 5–15)
BUN: 36 mg/dL — ABNORMAL HIGH (ref 8–23)
CO2: 20 mmol/L — ABNORMAL LOW (ref 22–32)
Calcium: 9.8 mg/dL (ref 8.9–10.3)
Chloride: 104 mmol/L (ref 98–111)
Creatinine, Ser: 2.01 mg/dL — ABNORMAL HIGH (ref 0.61–1.24)
GFR, Estimated: 32 mL/min — ABNORMAL LOW (ref >60)
Glucose, Bld: 100 mg/dL — ABNORMAL HIGH (ref 70–99)
Potassium: 3.4 mmol/L — ABNORMAL LOW (ref 3.5–5.1)
Sodium: 132 mmol/L — ABNORMAL LOW (ref 135–145)

## 2021-02-14 LAB — COMPREHENSIVE METABOLIC PANEL
ALT: 20 U/L (ref 0–44)
AST: 32 U/L (ref 15–41)
Albumin: 4.2 g/dL (ref 3.5–5.0)
Alkaline Phosphatase: 80 U/L (ref 38–126)
Anion gap: 11 (ref 5–15)
BUN: 39 mg/dL — ABNORMAL HIGH (ref 8–23)
CO2: 19 mmol/L — ABNORMAL LOW (ref 22–32)
Calcium: 10.9 mg/dL — ABNORMAL HIGH (ref 8.9–10.3)
Chloride: 99 mmol/L (ref 98–111)
Creatinine, Ser: 2.27 mg/dL — ABNORMAL HIGH (ref 0.61–1.24)
GFR, Estimated: 28 mL/min — ABNORMAL LOW (ref >60)
Glucose, Bld: 137 mg/dL — ABNORMAL HIGH (ref 70–99)
Potassium: 3.3 mmol/L — ABNORMAL LOW (ref 3.5–5.1)
Sodium: 129 mmol/L — ABNORMAL LOW (ref 135–145)
Total Bilirubin: 1.2 mg/dL (ref 0.3–1.2)
Total Protein: 7.5 g/dL (ref 6.5–8.1)

## 2021-02-14 LAB — MAGNESIUM: Magnesium: 2.1 mg/dL (ref 1.7–2.4)

## 2021-02-14 MED ORDER — LOPERAMIDE HCL 2 MG PO CAPS
4.0000 mg | ORAL_CAPSULE | Freq: Once | ORAL | Status: AC
  Administered 2021-02-14: 4 mg via ORAL
  Filled 2021-02-14: qty 2

## 2021-02-14 MED ORDER — SODIUM CHLORIDE 0.9 % IV BOLUS
500.0000 mL | Freq: Once | INTRAVENOUS | Status: AC
  Administered 2021-02-14: 500 mL via INTRAVENOUS

## 2021-02-14 MED ORDER — SODIUM CHLORIDE 0.9 % IV BOLUS
1000.0000 mL | Freq: Once | INTRAVENOUS | Status: AC
  Administered 2021-02-14: 1000 mL via INTRAVENOUS

## 2021-02-14 NOTE — ED Provider Notes (Signed)
[facility] Emergency Department Provider Note  Time seen: 10:27 AM  I have reviewed the triage vital signs and the nursing notes.   HISTORY  Chief Complaint Diarrhea   HPI Carlos Perkins is a 86 y.o. male with a past medical history of arthritis, hypertension, hyperlipidemia, presents to the emergency department for diarrhea.  According to the patient for the past 4 days he has been experiencing diarrhea.  States it is mostly clear now.  Largely happens at night and not so much during the day.  Today he was feeling somewhat weak so he came to the emergency department for evaluation concerned that he could be dehydrated.  Patient denies any nausea or vomiting.  Denies any abdominal pain.  No fever.  Largely negative review of systems.   Past Medical History:  Diagnosis Date   Arthritis    Hyperlipidemia    Hypertension    Myocardial infarction [facility])    Prostate cancer ([place])    Prostate CA approximately 11 years ago    Patient Active Problem List   Diagnosis Date Noted   Pathologic compression fracture of spine ([place]) 07/20/2020   Heart failure with reduced ejection fraction ([place]) 11/08/2019   History of back surgery 11/06/2019   History of MI (myocardial infarction) 11/06/2019   Goals of care, counseling/discussion 01/15/2017   Prostate cancer metastatic to bone ([place]) 01/14/2017   Hypercalcemia 12/11/2016   PAF (paroxysmal atrial fibrillation) ([place]) 12/11/2016   Fusion of spine of lumbosacral region 12/10/2016   Lumbar stenosis 03/17/2013   Incomplete emptying of bladder 03/11/2013   Lumbar radiculopathy, acute 03/02/2013   Hypertension 07/04/2011   History of prostate cancer 07/02/2011   Hyperlipidemia, unspecified 07/02/2011   ST elevation MI (STEMI) ([place]) 04/01/2006    Past Surgical History:  Procedure Laterality Date   APPENDECTOMY     BACK SURGERY     CARDIAC CATHETERIZATION     stent x 1   CHOLECYSTECTOMY     LUMBAR  LAMINECTOMY/DECOMPRESSION MICRODISCECTOMY Left 08/20/2012   Procedure: LUMBAR LAMINECTOMY/DECOMPRESSION MICRODISCECTOMY 1 LEVEL;  Surgeon: Faythe Ghee, MD;  Location: [facility];  Service: Neurosurgery;  Laterality: Left;  lumbar four-five   PROSTATE SURGERY     TONSILLECTOMY      Prior to Admission medications   Medication Sig Start Date End Date Taking? Authorizing Provider  abiraterone acetate (ZYTIGA) 250 MG tablet TAKE 4 TABLETS (1,000 MG TOTAL) BY MOUTH DAILY. TAKE ON AN EMPTY STOMACH 1 HOUR BEFORE OR 2 HOURS AFTER A MEAL 01/26/21 01/26/22  Sindy Guadeloupe, MD  apixaban [name]) 5 MG TABS tablet Take by mouth. 11/25/19   [provider]  atorvastatin (LIPITOR) 10 MG tablet Take 10 mg by mouth daily.    [provider]  carvedilol (COREG) 25 MG tablet Take 25 mg by mouth daily. 10/31/17   [provider]  cholecalciferol (VITAMIN D) 1000 units tablet Take 2,000 Units by mouth daily.    [provider]  cinacalcet (SENSIPAR) 30 MG tablet Take 30 mg by mouth 3 (three) times a week.    [provider]  furosemide (LASIX) 20 MG tablet Take 20 mg by mouth daily. 10/20/17   [provider]  hydrALAZINE (APRESOLINE) 25 MG tablet Take 25 mg by mouth 3 (three) times daily. Patient not taking: Reported on 01/19/2021    [provider]  lisinopril (PRINIVIL,ZESTRIL) 40 MG tablet Take 40 mg by mouth daily. 12/02/17   [provider]  nitroGLYCERIN (NITROSTAT) 0.4  MG SL tablet Place 0.4 mg under the tongue every 5 (five) minutes as needed. 01/25/16   [provider]  predniSONE (DELTASONE) 5 MG tablet Take 1 tablet by mouth once daily with breakfast 04/18/20   Sindy Guadeloupe, MD  spironolactone (ALDACTONE) 25 MG tablet Take 25 mg by mouth daily.    [provider]    No Known Allergies  Family History  Problem Relation Age of Onset   Stroke Mother    Heart attack Father     Social History Social History    Tobacco Use   Smoking status: Never   Smokeless tobacco: Never  Vaping Use   Vaping Use: Never used  Substance Use Topics   Alcohol use: No   Drug use: No    Review of Systems Constitutional: Negative for fever. Cardiovascular: Negative for chest pain. Respiratory: Negative for shortness of breath. Gastrointestinal: Negative for abdominal pain.  Positive for diarrhea.  Negative for nausea or vomiting. Genitourinary: Negative for urinary compaints Musculoskeletal: Negative for musculoskeletal complaints Neurological: Negative for headache All other ROS negative  ____________________________________________   PHYSICAL EXAM:  VITAL SIGNS: ED Triage Vitals  Enc Vitals Group     BP 02/14/21 0925 92/64     Pulse Rate 02/14/21 0925 (!) 105     Resp 02/14/21 0925 18     Temp 02/14/21 0925 97.8 F (36.6 C)     Temp Source 02/14/21 0925 Oral     SpO2 02/14/21 0929 97 %     Weight 02/14/21 0923 186 lb (84.4 kg)     Height 02/14/21 0923 5\' 10"  (1.778 m)     Head Circumference --      Peak Flow --      Pain Score 02/14/21 0922 0     Pain Loc --      Pain Edu? --      Excl. in [place]? --    Constitutional: Alert and oriented. Well appearing and in no distress. Eyes: Normal exam ENT      Head: Normocephalic and atraumatic.      Mouth/Throat: Mucous membranes are moist. Cardiovascular: Normal rate, regular rhythm.  Respiratory: Normal respiratory effort without tachypnea nor retractions. Breath sounds are clear Gastrointestinal: Soft and nontender. No distention.   Musculoskeletal: Nontender with normal range of motion in all extremities.  Neurologic:  Normal speech and language. No gross focal neurologic deficits  Skin:  Skin is warm, dry and intact.  Psychiatric: Mood and affect are normal.   ____________________________________________    EKG  EKG viewed and interpreted by myself shows atrial fibrillation at 78 bpm with a slightly widened  QRS  ____________________________________________    RADIOLOGY  No acute abnormality on CT imaging.  ____________________________________________   INITIAL IMPRESSION / ASSESSMENT AND PLAN / ED COURSE  Pertinent labs & imaging results that were available during my care of the patient were reviewed by me and considered in my medical decision making (see chart for details).   Patient presents emergency department for concerns of dehydration and weakness after diarrhea for the past 4 days.  Patient's lab work does show signs of dehydration with elevated lactate as well as creatinine.  We will dose 1.5 L of IV fluids recheck labs.  Recheck on labs shows creatinine improved, lactate significantly improved.  Patient states he is feeling much better.  Denies any abdominal pain or fever, CT scan is reassuring.  Given the patient's reassuring work-up we will dose loperamide in the emergency department, we  will discharge with instructions to increase oral hydration over the next 2 days and follow-up with his doctor Friday for recheck/reevaluation.  Patient agreeable to plan of care.  Discussed return precautions.  Patient has not had any diarrhea in the emergency department unable to provide a stool sample at this time.  Carlos Perkins was evaluated in Emergency Department on 02/14/2021 for the symptoms described in the history of present illness. He was evaluated in the context of the global COVID-19 pandemic, which necessitated consideration that the patient might be at risk for infection with the SARS-CoV-2 virus that causes COVID-19. Institutional protocols and algorithms that pertain to the evaluation of patients at risk for COVID-19 are in a state of rapid change based on information released by regulatory bodies including the CDC and federal and state organizations. These policies and algorithms were followed during the patient's care in the  ED.  ____________________________________________   FINAL CLINICAL IMPRESSION(S) / ED DIAGNOSES  Diarrhea Dehydration   Harvest Dark, MD 02/14/21 1449

## 2021-02-14 NOTE — ED Triage Notes (Signed)
Pt comes into the ED via wheelchair from [facility] with c/o watery diarrhea for the past 4 days, denies abd pain or vomiting, states he has had SOB with exertion, pt arrived on 2L [place],

## 2021-02-15 ENCOUNTER — Other Ambulatory Visit: Payer: Self-pay | Admitting: Oncology

## 2021-02-15 ENCOUNTER — Other Ambulatory Visit (HOSPITAL_COMMUNITY): Payer: Self-pay

## 2021-02-15 DIAGNOSIS — C7951 Secondary malignant neoplasm of bone: Secondary | ICD-10-CM

## 2021-02-15 MED ORDER — ABIRATERONE ACETATE 250 MG PO TABS
ORAL_TABLET | Freq: Every day | ORAL | 3 refills | Status: DC
  Filled 2021-02-23: qty 120, 30d supply, fill #0
  Filled 2021-03-22: qty 120, 30d supply, fill #1
  Filled 2021-04-23: qty 120, 30d supply, fill #2
  Filled 2021-05-31: qty 120, 30d supply, fill #3

## 2021-02-23 ENCOUNTER — Other Ambulatory Visit (HOSPITAL_COMMUNITY): Payer: Self-pay

## 2021-02-27 ENCOUNTER — Other Ambulatory Visit (HOSPITAL_COMMUNITY): Payer: Self-pay

## 2021-03-08 ENCOUNTER — Other Ambulatory Visit (HOSPITAL_COMMUNITY): Payer: Self-pay

## 2021-03-15 ENCOUNTER — Other Ambulatory Visit (HOSPITAL_COMMUNITY): Payer: Self-pay

## 2021-03-20 ENCOUNTER — Other Ambulatory Visit (HOSPITAL_COMMUNITY): Payer: Self-pay

## 2021-03-22 ENCOUNTER — Other Ambulatory Visit (HOSPITAL_COMMUNITY): Payer: Self-pay

## 2021-03-28 ENCOUNTER — Other Ambulatory Visit (HOSPITAL_COMMUNITY): Payer: Self-pay

## 2021-04-16 ENCOUNTER — Ambulatory Visit
Admission: RE | Admit: 2021-04-16 | Discharge: 2021-04-16 | Disposition: A | Discharge status: Home / Self Care | Payer: Medicare Other | Source: Ambulatory Visit | Attending: Oncology | Admitting: Oncology

## 2021-04-16 ENCOUNTER — Encounter
Admission: RE | Admit: 2021-04-16 | Discharge: 2021-04-16 | Disposition: A | Discharge status: Home / Self Care | Payer: Medicare Other | Source: Ambulatory Visit | Attending: Oncology | Admitting: Oncology

## 2021-04-16 ENCOUNTER — Other Ambulatory Visit: Payer: Self-pay

## 2021-04-16 DIAGNOSIS — C61 Malignant neoplasm of prostate: Secondary | ICD-10-CM | POA: Diagnosis not present

## 2021-04-16 DIAGNOSIS — C7951 Secondary malignant neoplasm of bone: Secondary | ICD-10-CM | POA: Diagnosis present

## 2021-04-16 LAB — POCT I-STAT CREATININE: Creatinine, Ser: 1.4 mg/dL — ABNORMAL HIGH (ref 0.61–1.24)

## 2021-04-16 MED ORDER — IOHEXOL 300 MG/ML  SOLN
100.0000 mL | Freq: Once | INTRAMUSCULAR | Status: AC | PRN
  Administered 2021-04-16: 100 mL via INTRAVENOUS

## 2021-04-16 MED ORDER — TECHNETIUM TC 99M MEDRONATE IV KIT
20.0000 | PACK | Freq: Once | INTRAVENOUS | Status: AC | PRN
  Administered 2021-04-16: 20.6 via INTRAVENOUS

## 2021-04-23 ENCOUNTER — Inpatient Hospital Stay: Payer: Medicare Other

## 2021-04-23 ENCOUNTER — Inpatient Hospital Stay: Payer: Medicare Other | Attending: Oncology

## 2021-04-23 ENCOUNTER — Encounter: Payer: Self-pay | Admitting: Oncology

## 2021-04-23 ENCOUNTER — Other Ambulatory Visit (HOSPITAL_COMMUNITY): Payer: Self-pay

## 2021-04-23 ENCOUNTER — Inpatient Hospital Stay (HOSPITAL_BASED_OUTPATIENT_CLINIC_OR_DEPARTMENT_OTHER): Payer: Medicare Other | Admitting: Oncology

## 2021-04-23 ENCOUNTER — Other Ambulatory Visit: Payer: Self-pay

## 2021-04-23 VITALS — BP 133/86 | HR 52 | Temp 99.3°F | Resp 16 | Ht 70.0 in | Wt 192.0 lb

## 2021-04-23 DIAGNOSIS — Z7952 Long term (current) use of systemic steroids: Secondary | ICD-10-CM | POA: Insufficient documentation

## 2021-04-23 DIAGNOSIS — E210 Primary hyperparathyroidism: Secondary | ICD-10-CM | POA: Insufficient documentation

## 2021-04-23 DIAGNOSIS — Z79899 Other long term (current) drug therapy: Secondary | ICD-10-CM

## 2021-04-23 DIAGNOSIS — Z9079 Acquired absence of other genital organ(s): Secondary | ICD-10-CM | POA: Insufficient documentation

## 2021-04-23 DIAGNOSIS — Z7901 Long term (current) use of anticoagulants: Secondary | ICD-10-CM | POA: Insufficient documentation

## 2021-04-23 DIAGNOSIS — M545 Low back pain, unspecified: Secondary | ICD-10-CM | POA: Diagnosis not present

## 2021-04-23 DIAGNOSIS — Z79818 Long term (current) use of other agents affecting estrogen receptors and estrogen levels: Secondary | ICD-10-CM

## 2021-04-23 DIAGNOSIS — Z5181 Encounter for therapeutic drug level monitoring: Secondary | ICD-10-CM | POA: Diagnosis not present

## 2021-04-23 DIAGNOSIS — I252 Old myocardial infarction: Secondary | ICD-10-CM | POA: Diagnosis not present

## 2021-04-23 DIAGNOSIS — C61 Malignant neoplasm of prostate: Secondary | ICD-10-CM | POA: Diagnosis present

## 2021-04-23 DIAGNOSIS — G8929 Other chronic pain: Secondary | ICD-10-CM | POA: Insufficient documentation

## 2021-04-23 DIAGNOSIS — C7951 Secondary malignant neoplasm of bone: Secondary | ICD-10-CM | POA: Diagnosis present

## 2021-04-23 DIAGNOSIS — Z7983 Long term (current) use of bisphosphonates: Secondary | ICD-10-CM

## 2021-04-23 DIAGNOSIS — M8580 Other specified disorders of bone density and structure, unspecified site: Secondary | ICD-10-CM | POA: Insufficient documentation

## 2021-04-23 DIAGNOSIS — Z7189 Other specified counseling: Secondary | ICD-10-CM

## 2021-04-23 LAB — CBC WITH DIFFERENTIAL/PLATELET
Abs Immature Granulocytes: 0.04 10*3/uL (ref 0.00–0.07)
Basophils Absolute: 0.1 10*3/uL (ref 0.0–0.1)
Basophils Relative: 1 %
Eosinophils Absolute: 0.1 10*3/uL (ref 0.0–0.5)
Eosinophils Relative: 1 %
HCT: 37.6 % — ABNORMAL LOW (ref 39.0–52.0)
Hemoglobin: 12.4 g/dL — ABNORMAL LOW (ref 13.0–17.0)
Immature Granulocytes: 1 %
Lymphocytes Relative: 17 %
Lymphs Abs: 1.5 10*3/uL (ref 0.7–4.0)
MCH: 29.4 pg (ref 26.0–34.0)
MCHC: 33 g/dL (ref 30.0–36.0)
MCV: 89.1 fL (ref 80.0–100.0)
Monocytes Absolute: 0.5 10*3/uL (ref 0.1–1.0)
Monocytes Relative: 5 %
Neutro Abs: 6.7 10*3/uL (ref 1.7–7.7)
Neutrophils Relative %: 75 %
Platelets: 212 10*3/uL (ref 150–400)
RBC: 4.22 MIL/uL (ref 4.22–5.81)
RDW: 14.5 % (ref 11.5–15.5)
WBC: 8.8 10*3/uL (ref 4.0–10.5)
nRBC: 0 % (ref 0.0–0.2)

## 2021-04-23 LAB — COMPREHENSIVE METABOLIC PANEL
ALT: 11 U/L (ref 0–44)
AST: 18 U/L (ref 15–41)
Albumin: 4 g/dL (ref 3.5–5.0)
Alkaline Phosphatase: 65 U/L (ref 38–126)
Anion gap: 8 (ref 5–15)
BUN: 19 mg/dL (ref 8–23)
CO2: 27 mmol/L (ref 22–32)
Calcium: 10.7 mg/dL — ABNORMAL HIGH (ref 8.9–10.3)
Chloride: 103 mmol/L (ref 98–111)
Creatinine, Ser: 1.16 mg/dL (ref 0.61–1.24)
GFR, Estimated: >60 mL/min (ref >60)
Glucose, Bld: 133 mg/dL — ABNORMAL HIGH (ref 70–99)
Potassium: 3.6 mmol/L (ref 3.5–5.1)
Sodium: 138 mmol/L (ref 135–145)
Total Bilirubin: 0.9 mg/dL (ref 0.3–1.2)
Total Protein: 6.9 g/dL (ref 6.5–8.1)

## 2021-04-23 LAB — PSA: Prostatic Specific Antigen: 0.02 ng/mL (ref 0.00–4.00)

## 2021-04-23 MED ORDER — LEUPROLIDE ACETATE (6 MONTH) 45 MG ~~LOC~~ KIT
45.0000 mg | PACK | Freq: Once | SUBCUTANEOUS | Status: AC
  Administered 2021-04-23: 45 mg via SUBCUTANEOUS
  Filled 2021-04-23: qty 45

## 2021-04-23 MED ORDER — ZOLEDRONIC ACID 4 MG/100ML IV SOLN
4.0000 mg | INTRAVENOUS | Status: DC
  Administered 2021-04-23: 4 mg via INTRAVENOUS
  Filled 2021-04-23: qty 100

## 2021-04-23 NOTE — Progress Notes (Signed)
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 He also has a past history of prostate cancer about 15 years ago (Gleasons score 8) and was treated with prostatectomy at that time. He did not receive any radiation treatment and does not remember if his PSA was subsequently monitored. PSA in March 2015 was 7.9 and in 2014 was 6.7 after prostatectomy. He was last seen by medical oncology Dr. Everlene Other in 2015 and at that time his PSA was 9.2 previously was supposed to follow-up after 2 months and ADT was supposed to be started at that time the patient was lost to follow-up.   He was seen by Dr. Golden Pop and his primary care doctor he saw until about 2 years ago. He has had ongoing chronic back pain and has had back surgery in the past but was having worsening back pain over the last few months and was seen by Dr. Raul Del.   MRI of the lumbar spine on 11/22/2016 revealed extensive osseous metastatic disease with pathologic compression fracture of the L3 vertebral body. Posteriorly displace fragments and metastatic soft tissue result in severe canal stenosis and compression of the thecal sac at this level.   CT chest abdomen and pelvis on 11/25/2016 did not reveal any evidence of metastatic disease other than his bones    Patient had decompression surgery of his L3 vertebral body which showed:Metastatic adenocarcinoma with features consistent with primary prostatic origin. See note.  Note: The patient history of previously diagnosed prostate carcinoma is noted. Immunoperoxidase stains were performed on formalin-fixed (non-decalcified) paraffin-embedded tissue in block A3 with appropriate positive controls. The tumor cells stain with CK8/18, PROSTATIC SPECIFIC ANTIGEN, PROSTATIC ACID PHOSPHATASE and P501S. The tumor cells do not stain with CYTOKERATIN 7, CYTOKERATIN 20, CK5/6, P40, GATA-3, CDX-2 or THYROID TRANSCRIPTION FACTOR (TTF-1). These findings are in support of the interpretation of metastatic adenocarcinoma with features consistent with primary prostatic origin.   Patient also has primary hyperparathyroidism and hypercalcemia secondary to it for which he sees Dr.Akhter from [facility] and has been referred to surgery for the same   Abiraterone was started for high risk castrate sensitive prostate cancer on 02/14/2017.  Baseline bone density showed osteopenia with a T score of -1.9 at the right femur neck.    Interval history-patient is doing well for his age overall.  He remains independent of his ADLs at home.  Tolerating Zytiga well without any significant side effects.  Denies any leg swelling presently.  ECOG PS- 1 Pain scale- 0   Review of systems- Review of Systems  Constitutional:  Positive for malaise/fatigue. Negative for chills, fever and weight loss.  HENT:  Negative for congestion, ear discharge and nosebleeds.   Eyes:  Negative for blurred vision.  Respiratory:  Negative for cough, hemoptysis, sputum production, shortness of breath and wheezing.   Cardiovascular:  Negative for chest pain, palpitations, orthopnea and claudication.  Gastrointestinal:  Negative for  abdominal pain, blood in stool, constipation, diarrhea, heartburn, melena, nausea and vomiting.  Genitourinary:  Negative for dysuria,  flank pain, frequency, hematuria and urgency.  Musculoskeletal:  Negative for back pain, joint pain and myalgias.  Skin:  Negative for rash.  Neurological:  Negative for dizziness, tingling, focal weakness, seizures, weakness and headaches.  Endo/Heme/Allergies:  Does not bruise/bleed easily.  Psychiatric/Behavioral:  Negative for depression and suicidal ideas. The patient does not have insomnia.      No Known Allergies   Past Medical History:  Diagnosis Date   Arthritis    Hyperlipidemia    Hypertension    Myocardial infarction [facility])    Prostate cancer ([place])    Prostate CA approximately 11 years ago     Past Surgical History:  Procedure Laterality Date   APPENDECTOMY     BACK SURGERY     CARDIAC CATHETERIZATION     stent x 1   CHOLECYSTECTOMY     LUMBAR LAMINECTOMY/DECOMPRESSION MICRODISCECTOMY Left 08/20/2012   Procedure: LUMBAR LAMINECTOMY/DECOMPRESSION MICRODISCECTOMY 1 LEVEL;  Surgeon: Faythe Ghee, MD;  Location: [facility];  Service: Neurosurgery;  Laterality: Left;  lumbar four-five   PROSTATE SURGERY     TONSILLECTOMY      Social History   Socioeconomic History   Marital status: Married    Spouse name: Not on file   Number of children: Not on file   Years of education: Not on file   Highest education level: Not on file  Occupational History   Not on file  Tobacco Use   Smoking status: Never   Smokeless tobacco: Never  Vaping Use   Vaping Use: Never used  Substance and Sexual Activity   Alcohol use: No   Drug use: No   Sexual activity: Not Currently  Other Topics Concern   Not on file  Social History Narrative   Not on file   Social Determinants of Health   Financial Resource Strain: Not on file  Food Insecurity: Not on file  Transportation Needs: Not on file  Physical Activity: Not on file  Stress: Not on file  Social Connections: Not on file  Intimate Partner Violence: Not on file    Family History  Problem Relation Age of Onset    Stroke Mother    Heart attack Father      Current Outpatient Medications:    abiraterone acetate (ZYTIGA) 250 MG tablet, TAKE 4 TABLETS (1,000 MG TOTAL) BY MOUTH DAILY. TAKE ON AN EMPTY STOMACH 1 HOUR BEFORE OR 2 HOURS AFTER A MEAL, Disp: 120 tablet, Rfl: 3   apixaban (ELIQUIS) 5 MG TABS tablet, Take by mouth., Disp: , Rfl:    atorvastatin (LIPITOR) 10 MG tablet, Take 10 mg by mouth daily., Disp: , Rfl:    carvedilol (COREG) 25 MG tablet, Take 25 mg by mouth daily., Disp: , Rfl:    cholecalciferol (VITAMIN D) 1000 units tablet, Take 2,000 Units by mouth daily., Disp: , Rfl:    cinacalcet (SENSIPAR) 30 MG tablet, Take 30 mg by mouth 3 (three) times a week., Disp: , Rfl:    furosemide (LASIX) 20 MG tablet, Take 20 mg by mouth daily., Disp: , Rfl: 11   lisinopril (PRINIVIL,ZESTRIL) 40 MG tablet, Take 40 mg by mouth daily., Disp: , Rfl: 3   nitroGLYCERIN (NITROSTAT) 0.4 MG SL tablet, Place 0.4 mg under the tongue every 5 (five) minutes as needed., Disp: , Rfl:    predniSONE (DELTASONE) 5 MG tablet, Take 1 tablet by mouth once  daily with breakfast, Disp: 90 tablet, Rfl: 0   spironolactone (ALDACTONE) 25 MG tablet, Take 25 mg by mouth daily., Disp: , Rfl:  No current facility-administered medications for this visit.  Facility-Administered Medications Ordered in Other Visits:    Zoledronic Acid (ZOMETA) IVPB 4 mg, 4 mg, Intravenous, Q6 months, Sindy Guadeloupe, MD, Stopped at 04/23/21 1416  Physical exam:  Vitals:   04/23/21 1302  BP: 133/86  Pulse: (!) 52  Resp: 16  Temp: 99.3 F (37.4 C)  TempSrc: Tympanic  SpO2: 100%  Weight: 192 lb (87.1 kg)  Height: 5\' 10"  (1.778 m)   Physical Exam Constitutional:      General: He is not in acute distress. Cardiovascular:     Rate and Rhythm: Normal rate and regular rhythm.     Heart sounds: Normal heart sounds.  Pulmonary:     Effort: Pulmonary effort is normal.     Breath sounds: Normal breath sounds.  Musculoskeletal:     Right lower leg:  No edema.     Left lower leg: No edema.  Skin:    General: Skin is warm and dry.  Neurological:     Mental Status: He is alert and oriented to person, place, and time.     CMP Latest Ref Rng & Units 04/23/2021  Glucose 70 - 99 mg/dL 133(H)  BUN 8 - 23 mg/dL 19  Creatinine 0.61 - 1.24 mg/dL 1.16  Sodium 135 - 145 mmol/L 138  Potassium 3.5 - 5.1 mmol/L 3.6  Chloride 98 - 111 mmol/L 103  CO2 22 - 32 mmol/L 27  Calcium 8.9 - 10.3 mg/dL 10.7(H)  Total Protein 6.5 - 8.1 g/dL 6.9  Total Bilirubin 0.3 - 1.2 mg/dL 0.9  Alkaline Phos 38 - 126 U/L 65  AST 15 - 41 U/L 18  ALT 0 - 44 U/L 11   CBC Latest Ref Rng & Units 04/23/2021  WBC 4.0 - 10.5 K/uL 8.8  Hemoglobin 13.0 - 17.0 g/dL 12.4(L)  Hematocrit 39.0 - 52.0 % 37.6(L)  Platelets 150 - 400 K/uL 212    No images are attached to the encounter.  NM Bone Scan Whole Body  Result Date: 04/16/2021 CLINICAL DATA:  Metastatic prostate cancer, history of prior back surgery EXAM: NUCLEAR MEDICINE WHOLE BODY BONE SCAN TECHNIQUE: Whole body anterior and posterior images were obtained approximately 3 hours after intravenous injection of radiopharmaceutical. RADIOPHARMACEUTICALS:  20.6 mCi Technetium-37m MDP IV COMPARISON:  04/16/2021 CT, 07/11/2020 bone scan, 07/11/2020 CT FINDINGS: Physiologic excretion of radiotracer within the kidneys and bladder. Radiotracer uptake within the bilateral shoulders and medial compartments of the bilateral knees consistent with degenerative change. Mildly heterogeneous radiotracer activity within the thoracolumbar spine is unchanged since prior study. Within the thoracic spine, this is consistent with known sclerotic metastases. Within the lumbar spine, this is compatible with a combination of metastatic disease and postsurgical changes from prior lumbar fusion. Asymmetric activity involving the anterior margin of the left iliac bone consistent with known sclerotic metastasis, stable. Asymmetric activity involving the left  sacral ala consistent with known metastatic lesion, stable. There are no new areas of radiotracer uptake to suggest progressive bony metastases. IMPRESSION: 1. Stable bony metastatic disease within the thoracolumbar spine, left iliac bone, and left sacral ala. No new bony metastases. Electronically Signed   By: Randa Ngo M.D.   On: 04/16/2021 16:38   CT CHEST ABDOMEN PELVIS W CONTRAST  Result Date: 04/16/2021 CLINICAL DATA:  Prostate cancer. Evaluate treatment response. Known bone metastasis. EXAM:  CT CHEST, ABDOMEN, AND PELVIS WITH CONTRAST TECHNIQUE: Multidetector CT imaging of the chest, abdomen and pelvis was performed following the standard protocol during bolus administration of intravenous contrast. RADIATION DOSE REDUCTION: This exam was performed according to the departmental dose-optimization program which includes automated exposure control, adjustment of the mA and/or kV according to patient size and/or use of iterative reconstruction technique. CONTRAST:  173mL OMNIPAQUE IOHEXOL 300 MG/ML  SOLN COMPARISON:  02/14/2021 abdominopelvic CT.  08/29/2020 CTA chest. FINDINGS: CT CHEST FINDINGS Cardiovascular: Aortic atherosclerosis. Mild cardiomegaly with left atrial enlargement. Lad and right coronary artery calcification. Pulmonary artery enlargement, outflow tract 3.1 cm No central pulmonary embolism, on this non-dedicated study. Mediastinum/Nodes: No supraclavicular adenopathy. No mediastinal or hilar adenopathy. 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 Lungs/Pleura: No pleural fluid. A 2 mm left apical pulmonary nodule on 31/4 is unchanged compared to 08/29/2020. Left lower lobe pulmonary nodule measures 1.4 x 1.4 cm on 125/4. Similar to 1.5 x 1.3 cm on 02/14/2021 (when remeasured). Significantly smaller than back on 05/31/2019, favoring infectious or inflammatory etiology. Adjacent left lower  lobe calcifications. Musculoskeletal: Similar diffuse sclerotic osseous metastasis. CT ABDOMEN PELVIS FINDINGS Hepatobiliary: Scattered well-circumscribed low-density liver lesions are similar, less than a cm, and likely cysts. Altered perfusion adjacent the falciform ligament. Cholecystectomy, without biliary ductal dilatation. Pancreas: Normal, without mass or ductal dilatation. Spleen: Normal in size, without focal abnormality. Adrenals/Urinary Tract: Normal adrenal glands. Inter/upper polar right renal 5.7 cm cyst. Bilateral too small to characterize renal lesions and smaller cysts. Mild renal cortical thinning bilaterally. Lower pole 4 mm right renal collecting system calculus. No hydronephrosis.  Normal urinary bladder. Stomach/Bowel: Normal stomach, without wall thickening. Periampullary duodenal diverticulum. Otherwise normal small bowel. Moderate stool in the rectum. Extensive colonic diverticulosis. Transverse colonic underdistention. Normal terminal ileum. Vascular/Lymphatic: Aortic atherosclerosis. Retroaortic left renal vein. No abdominal adenopathy. Pelvic sidewall node dissection, without sidewall adenopathy. Reproductive: Prostatectomy, without local recurrence. Other: No significant free fluid. Musculoskeletal: Intramuscular lipoma within the right thigh musculature at 2.6 cm. Extensive sclerotic osseous metastasis. Example 1.6 cm in the sacrum, similar to on the prior exam. Lumbosacral spine fixation. IMPRESSION: 1. Similar sclerotic osseous metastasis. 2. No evidence of soft tissue metastasis. Status post prostatectomy. 3. Left lower lobe nodular density is decreased compared to 05/31/2019, favoring an infectious or inflammatory etiology. 4. Right nephrolithiasis 5. Pulmonary artery enlargement suggests pulmonary arterial hypertension. 6. Coronary artery atherosclerosis. Aortic Atherosclerosis (ICD10-I70.0). Electronically Signed   By: Abigail Miyamoto M.D.   On: 04/16/2021 11:34     Assessment and  plan- Patient is a 87 y.o. 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  He is here to discuss CT scan and bone scan results and further management.    I have reviewedCT chest abdomen pelvis as well as bone scan images independently and discussed findings with the patient which shows overall evidence of stable bony metastatic disease and no evidence of recurrent or progressive disease.  PSA from today is pending.  His last PSA from October 2022 did show a mild increasing trend from less than 0.01-2.02.  Continue to monitor PSA but will continue Zytiga at this time.  He will receive Lupron today as well.  Patient is receiving Zometa every 6 months for his osteopenia which she will receive today.  He has chronic hypercalcemia unrelated to malignancy which has been followed by endocrinology as  well.  We will repeat bone density scan prior to my next visit with me  I will see him back in 3 months with CBC with differential CMP and PSA   Visit Diagnosis 1. Prostate cancer metastatic to bone ([place])   2. High risk medication use   3. Goals of care, counseling/discussion   4. Encounter for monitoring zoledronic acid therapy   5. Encounter for monitoring Lupron therapy      Dr. Randa Evens, MD, MPH [facility] [phone] 04/23/2021 3:07 PM

## 2021-04-23 NOTE — Patient Instructions (Signed)
[facility] AT [place]  Discharge Instructions: Thank you for choosing [facility] to provide your oncology and hematology care.  If you have a lab appointment with the [facility], please go directly to the [facility] and check in at the registration area.  Wear comfortable clothing and clothing appropriate for easy access to any Portacath or PICC line.   We strive to give you quality time with your provider. You may need to reschedule your appointment if you arrive late (15 or more minutes).  Arriving late affects you and other patients whose appointments are after yours.  Also, if you miss three or more appointments without notifying the office, you may be dismissed from the clinic at the providers discretion.      For prescription refill requests, have your pharmacy contact our office and allow 72 hours for refills to be completed.    Today you received the following chemotherapy and/or immunotherapy agents ZOMETA and LUPRON      To help prevent nausea and vomiting after your treatment, we encourage you to take your nausea medication as directed.  BELOW ARE SYMPTOMS THAT SHOULD BE REPORTED IMMEDIATELY: *FEVER GREATER THAN 100.4 F (38 C) OR HIGHER *CHILLS OR SWEATING *NAUSEA AND VOMITING THAT IS NOT CONTROLLED WITH YOUR NAUSEA MEDICATION *UNUSUAL SHORTNESS OF BREATH *UNUSUAL BRUISING OR BLEEDING *URINARY PROBLEMS (pain or burning when urinating, or frequent urination) *BOWEL PROBLEMS (unusual diarrhea, constipation, pain near the anus) TENDERNESS IN MOUTH AND THROAT WITH OR WITHOUT PRESENCE OF ULCERS (sore throat, sores in mouth, or a toothache) UNUSUAL RASH, SWELLING OR PAIN  UNUSUAL VAGINAL DISCHARGE OR ITCHING   Items with * indicate a potential emergency and should be followed up as soon as possible or go to the Emergency Department if any problems should occur.  Please show the CHEMOTHERAPY ALERT CARD or IMMUNOTHERAPY ALERT CARD at  check-in to the Emergency Department and triage nurse.  Should you have questions after your visit or need to cancel or reschedule your appointment, please contact [facility] CANCER [place] AT [place]  [phone] and follow the prompts.  Office hours are 8:00 a.m. to 4:30 p.m. Monday - Friday. Please note that voicemails left after 4:00 p.m. may not be returned until the following business day.  We are closed weekends and major holidays. You have access to a nurse at all times for urgent questions. Please call the main number to the clinic [phone] and follow the prompts.  For any non-urgent questions, you may also contact your provider using MyChart. We now offer e-Visits for anyone 13 and older to request care online for non-urgent symptoms. For details visit [email or web address].   Also download the MyChart app! Go to the app store, search "MyChart", open the app, select [facility] [place], and log in with your MyChart username and password.  Due to Covid, a mask is required upon entering the hospital/clinic. If you do not have a mask, one will be given to you upon arrival. For doctor visits, patients may have 1 support person aged 59 or older with them. For treatment visits, patients cannot have anyone with them due to current Covid guidelines and our immunocompromised population.   Zoledronic Acid Injection (Hypercalcemia, Oncology) What is this medication? ZOLEDRONIC ACID (ZOE le dron ik AS id) slows calcium loss from bones. It high calcium levels in the blood from some kinds of cancer. It may be used in other people at risk for bone loss. This medicine may be used for other  purposes; ask your health care provider or pharmacist if you have questions. COMMON BRAND NAME(S): Zometa What should I tell my care team before I take this medication? They need to know if you have any of these conditions: cancer dehydration dental disease kidney disease liver disease low levels of calcium  in the blood lung or breathing disease (asthma) receiving steroids like dexamethasone or prednisone an unusual or allergic reaction to zoledronic acid, other medicines, foods, dyes, or preservatives pregnant or trying to get pregnant breast-feeding How should I use this medication? This drug is injected into a vein. It is given by a health care provider in a hospital or clinic setting. Talk to your health care provider about the use of this drug in children. Special care may be needed. Overdosage: If you think you have taken too much of this medicine contact a poison control center or emergency room at once. NOTE: This medicine is only for you. Do not share this medicine with others. What if I miss a dose? Keep appointments for follow-up doses. It is important not to miss your dose. Call your health care provider if you are unable to keep an appointment. What may interact with this medication? certain antibiotics given by injection NSAIDs, medicines for pain and inflammation, like ibuprofen or naproxen some diuretics like bumetanide, furosemide teriparatide thalidomide This list may not describe all possible interactions. Give your health care provider a list of all the medicines, herbs, non-prescription drugs, or dietary supplements you use. Also tell them if you smoke, drink alcohol, or use illegal drugs. Some items may interact with your medicine. What should I watch for while using this medication? Visit your health care provider for regular checks on your progress. It may be some time before you see the benefit from this drug. Some people who take this drug have severe bone, joint, or muscle pain. This drug may also increase your risk for jaw problems or a broken thigh bone. Tell your health care provider right away if you have severe pain in your jaw, bones, joints, or muscles. Tell you health care provider if you have any pain that does not go away or that gets worse. Tell your dentist  and dental surgeon that you are taking this drug. You should not have major dental surgery while on this drug. See your dentist to have a dental exam and fix any dental problems before starting this drug. Take good care of your teeth while on this drug. Make sure you see your dentist for regular follow-up appointments. You should make sure you get enough calcium and vitamin D while you are taking this drug. Discuss the foods you eat and the vitamins you take with your health care provider. Check with your health care provider if you have severe diarrhea, nausea, and vomiting, or if you sweat a lot. The loss of too much body fluid may make it dangerous for you to take this drug. You may need blood work done while you are taking this drug. Do not become pregnant while taking this drug. Women should inform their health care provider if they wish to become pregnant or think they might be pregnant. There is potential for serious harm to an unborn child. Talk to your health care provider for more information. What side effects may I notice from receiving this medication? Side effects that you should report to your doctor or health care provider as soon as possible: allergic reactions (skin rash, itching or hives; swelling of the face,  lips, or tongue) bone pain infection (fever, chills, cough, sore throat, pain or trouble passing urine) jaw pain, especially after dental work joint pain kidney injury (trouble passing urine or change in the amount of urine) low blood pressure (dizziness; feeling faint or lightheaded, falls; unusually weak or tired) low calcium levels (fast heartbeat; muscle cramps or pain; pain, tingling, or numbness in the hands or feet; seizures) low magnesium levels (fast, irregular heartbeat; muscle cramp or pain; muscle weakness; tremors; seizures) low red blood cell counts (trouble breathing; feeling faint; lightheaded, falls; unusually weak or tired) muscle pain redness, blistering,  peeling, or loosening of the skin, including inside the mouth severe diarrhea swelling of the ankles, feet, hands trouble breathing Side effects that usually do not require medical attention (report to your doctor or health care provider if they continue or are bothersome): anxious constipation coughing depressed mood eye irritation, itching, or pain fever general ill feeling or flu-like symptoms nausea pain, redness, or irritation at site where injected trouble sleeping This list may not describe all possible side effects. Call your doctor for medical advice about side effects. You may report side effects to FDA at 1-800-FDA-1088. Where should I keep my medication? This drug is given in a hospital or clinic. It will not be stored at home. NOTE: This sheet is a summary. It may not cover all possible information. If you have questions about this medicine, talk to your doctor, pharmacist, or health care provider.  2022 Elsevier/Gold Standard (2020-12-05 00:00:00)   Leuprolide injection What is this medication? LEUPROLIDE (loo PROE lide) is a man-made hormone. It is used to treat the symptoms of prostate cancer. This medicine may also be used to treat children with early onset of puberty. It may be used for other hormonal conditions. This medicine may be used for other purposes; ask your health care provider or pharmacist if you have questions. COMMON BRAND NAME(S): Lupron What should I tell my care team before I take this medication? They need to know if you have any of these conditions: diabetes heart disease or previous heart attack high blood pressure high cholesterol pain or difficulty passing urine spinal cord metastasis stroke tobacco smoker an unusual or allergic reaction to leuprolide, benzyl alcohol, other medicines, foods, dyes, or preservatives pregnant or trying to get pregnant breast-feeding How should I use this medication? This medicine is for injection under  the skin or into a muscle. You will be taught how to prepare and give this medicine. Use exactly as directed. Take your medicine at regular intervals. Do not take your medicine more often than directed. It is important that you put your used needles and syringes in a special sharps container. Do not put them in a trash can. If you do not have a sharps container, call your pharmacist or healthcare provider to get one. A special MedGuide will be given to you by the pharmacist with each prescription and refill. Be sure to read this information carefully each time. Talk to your pediatrician regarding the use of this medicine in children. While this medicine may be prescribed for children as young as 8 years for selected conditions, precautions do apply. Overdosage: If you think you have taken too much of this medicine contact a poison control center or emergency room at once. NOTE: This medicine is only for you. Do not share this medicine with others. What if I miss a dose? If you miss a dose, take it as soon as you can. If it is almost  time for your next dose, take only that dose. Do not take double or extra doses. What may interact with this medication? Do not take this medicine with any of the following medications: chasteberry cisapride dronedarone pimozide thioridazine This medicine may also interact with the following medications: herbal or dietary supplements, like black cohosh or DHEA male hormones, like estrogens or progestins and birth control pills, patches, rings, or injections male hormones, like testosterone other medicines that prolong the QT interval (abnormal heart rhythm) This list may not describe all possible interactions. Give your health care provider a list of all the medicines, herbs, non-prescription drugs, or dietary supplements you use. Also tell them if you smoke, drink alcohol, or use illegal drugs. Some items may interact with your medicine. What should I watch for  while using this medication? Visit your doctor or health care professional for regular checks on your progress. During the first week, your symptoms may get worse, but then will improve as you continue your treatment. You may get hot flashes, increased bone pain, increased difficulty passing urine, or an aggravation of nerve symptoms. Discuss these effects with your doctor or health care professional, some of them may improve with continued use of this medicine. Male patients may experience a menstrual cycle or spotting during the first 2 months of therapy with this medicine. If this continues, contact your doctor or health care professional. This medicine may increase blood sugar. Ask your healthcare provider if changes in diet or medicines are needed if you have diabetes. What side effects may I notice from receiving this medication? Side effects that you should report to your doctor or health care professional as soon as possible: allergic reactions like skin rash, itching or hives, swelling of the face, lips, or tongue breathing problems chest pain depression or memory disorders pain in your legs or groin pain at site where injected severe headache signs and symptoms of high blood sugar such as being more thirsty or hungry or having to urinate more than normal. You may also feel very tired or have blurry vision swelling of the feet and legs visual changes vomiting Side effects that usually do not require medical attention (report to your doctor or health care professional if they continue or are bothersome): breast swelling or tenderness decrease in sex drive or performance diarrhea hot flashes loss of appetite muscle, joint, or bone pains nausea redness or irritation at site where injected skin problems or acne This list may not describe all possible side effects. Call your doctor for medical advice about side effects. You may report side effects to FDA at 1-800-FDA-1088. Where  should I keep my medication? Keep out of the reach of children. Store below 25 degrees C (77 degrees F). Do not freeze. Protect from light. Do not use if it is not clear or if there are particles present. Throw away any unused medicine after the expiration date. NOTE: This sheet is a summary. It may not cover all possible information. If you have questions about this medicine, talk to your doctor, pharmacist, or health care provider.  2022 Elsevier/Gold Standard (2020-12-05 00:00:00)

## 2021-04-30 ENCOUNTER — Other Ambulatory Visit (HOSPITAL_COMMUNITY): Payer: Self-pay

## 2021-05-22 ENCOUNTER — Other Ambulatory Visit (HOSPITAL_COMMUNITY): Payer: Self-pay

## 2021-05-31 ENCOUNTER — Other Ambulatory Visit (HOSPITAL_COMMUNITY): Payer: Self-pay

## 2021-06-21 ENCOUNTER — Other Ambulatory Visit (HOSPITAL_COMMUNITY): Payer: Self-pay

## 2021-06-25 ENCOUNTER — Other Ambulatory Visit (HOSPITAL_COMMUNITY): Payer: Self-pay

## 2021-06-25 ENCOUNTER — Other Ambulatory Visit: Payer: Self-pay | Admitting: Oncology

## 2021-06-25 DIAGNOSIS — C61 Malignant neoplasm of prostate: Secondary | ICD-10-CM

## 2021-06-25 MED ORDER — ABIRATERONE ACETATE 250 MG PO TABS
ORAL_TABLET | Freq: Every day | ORAL | 3 refills | Status: AC
  Filled 2021-06-25: qty 120, 30d supply, fill #0

## 2021-06-25 NOTE — Telephone Encounter (Signed)
CBC with Differential/Platelet ?Order: [id number] ?Status: Final result    ?Visible to patient: Yes (not seen)    ?Next appt: 07/23/2021 at 10:15 AM in Oncology ([facility])    ?Dx: Prostate cancer metastatic to bone [facility])    ?0 Result Notes ?          ?Component Ref Range & Units 2 mo ago 4 mo ago 5 mo ago 8 mo ago 10 mo ago 11 mo ago 1 yr ago  ?WBC 4.0 - 10.5 K/uL 8.8  7.8  7.2  8.8  11.2 High   6.9  7.7   ?RBC 4.22 - 5.81 MIL/uL 4.22  4.41  3.95 Low   4.41  3.67 Low   4.27  4.23   ?Hemoglobin 13.0 - 17.0 g/dL 12.4 Low   12.8 Low   11.4 Low   12.2 Low   10.6 Low   12.4 Low   12.3 Low    ?HCT 39.0 - 52.0 % 37.6 Low   37.6 Low   35.5 Low   38.4 Low   32.8 Low   38.3 Low   37.8 Low    ?MCV 80.0 - 100.0 fL 89.1  85.3  89.9  87.1  89.4  89.7  89.4   ?MCH 26.0 - 34.0 pg 29.4  29.0  28.9  27.7  28.9  29.0  29.1   ?MCHC 30.0 - 36.0 g/dL 33.0  34.0  32.1  31.8  32.3  32.4  32.5   ?RDW 11.5 - 15.5 % 14.5  14.2  15.6 High   15.3  14.6  15.4  14.1   ?Platelets 150 - 400 K/uL 212  310  231  211  221  233  217   ?nRBC 0.0 - 0.2 % 0.0  0.0  0.0  0.0  0.0 CM  0.0  0.0   ?Neutrophils Relative % % 75  65  73  73   67  64   ?Neutro Abs 1.7 - 7.7 K/uL 6.7  5.1  5.2  6.5   4.6  4.9   ?Lymphocytes Relative % '17  26  20  17   23  24   '$ ?Lymphs Abs 0.7 - 4.0 K/uL 1.5  2.1  1.5  1.5   1.6  1.8   ?Monocytes Relative % '5  6  5  7   7  8   '$ ?Monocytes Absolute 0.1 - 1.0 K/uL 0.5  0.4  0.4  0.6   0.5  0.6   ?Eosinophils Relative % '1  3  2  1   2  3   '$ ?Eosinophils Absolute 0.0 - 0.5 K/uL 0.1  0.2  0.1  0.1   0.2  0.2   ?Basophils Relative % 1  0  0  '1   1  1   '$ ?Basophils Absolute 0.0 - 0.1 K/uL 0.1  0.0  0.0  0.1   0.0  0.1   ?Immature Granulocytes % 1  0  0  1   0  0   ?Abs Immature Granulocytes 0.00 - 0.07 K/uL 0.04  0.01 CM  0.03 CM  0.04 CM   0.03 CM  0.03 CM   ?Comment: Performed at [facility], [address], [place], [place] [address]  ?Resulting Agency  [facility] [place] [facility] [place] [facility] [place] [facility] [place] [facility] [place] [facility] [place]  [facility]  ?  ? ?  ?  ?Specimen Collected: 04/23/21 12:35  Last Resulted: 04/23/21 12:46  ?  ?  Lab Flowsheet   ? Order Details   ? View Encounter   ? Lab and Collection Details   ? Routing   ? Result History    ?View Encounter Conversation    ?  ?CM=Additional comments    ?  ?Result Care Coordination ? ? ?Patient Communication ? ? Add Comments   Add Notifications  Back to Top  ?  ?  ? ?Other Results from 04/23/2021 ? ?PSA ?Order: [id number] ?Status: Final result    ?Visible to patient: Yes (seen)    ?Next appt: 07/23/2021 at 10:15 AM in Oncology ([facility])    ?Dx: Prostate cancer metastatic to bone [facility])    ?0 Result Notes ?          ?Component Ref Range & Units 2 mo ago ?(04/23/21) 5 mo ago ?(01/19/21) 8 mo ago ?(10/19/20) 11 mo ago ?(07/20/20) 1 yr ago ?(04/21/20) 1 yr ago ?(01/06/20) 1 yr ago ?(10/15/19)  ?Prostatic Specific Antigen 0.00 - 4.00 ng/mL 0.02  0.02 CM  <0.01 CM  <0.01 CM  0.02 CM  <0.01 CM  0.02 CM   ?Comment: (NOTE)  ?While PSA levels of <=4.0 ng/ml are reported as reference range, some  ?men with levels below 4.0 ng/ml can have prostate cancer and many men  ?with PSA above 4.0 ng/ml do not have prostate cancer.  Other tests  ?such as free PSA, age specific reference ranges, PSA velocity and PSA  ?doubling time may be helpful especially in men less than 98 years  ?old.  ?Performed at [facility], [place] [address], [place], [place]  ?[address]   ?Resulting Agency  [place] [facility] [place] [facility] [place] [facility] [place] [facility] [place] [facility] [place] [facility] [place] [facility]  ?  ? ?  ?  ?Specimen Collected: 04/23/21 12:35 Last Resulted: 04/23/21 20:11  ?  ?  Lab Flowsheet   ? Order Details   ? View Encounter   ? Lab and Collection Details   ? Routing   ? Result History    ?View Encounter Conversation    ?  ?CM=Additional comments    ?  ?Result Care Coordination ? ? ?Patient Communication ? ? Add Comments   Seen Back to Top  ?  ?  ? ?  ? Contains abnormal data Comprehensive metabolic panel ?Order: [id number] ?Status: Final result     ?Visible to patient: Yes (not seen)    ?Next appt: 07/23/2021 at 10:15 AM in Oncology ([facility])    ?Dx: Prostate cancer metastatic to bone [facility])    ?0 Result Notes ?          ?Component Ref Range & Units 2 mo ago ?(04/23/21) 2 mo ago ?(04/16/21) 4 mo ago ?(02/14/21) 4 mo ago ?(02/14/21) 5 mo ago ?(01/19/21) 8 mo ago ?(10/19/20) 10 mo ago ?(08/29/20)  ?Sodium 135 - 145 mmol/L 138   132 Low   129 Low   137  136    ?Potassium 3.5 - 5.1 mmol/L 3.6   3.4 Low   3.3 Low   4.2  3.9    ?Chloride 98 - 111 mmol/L 103   104  99  102  102    ?CO2 22 - 32 mmol/L 27   20 Low   19 Low   28  26    ?Glucose, Bld 70 - 99 mg/dL 133 High    100 High  CM  137  High  CM  122 High  CM  113 High  CM    ?Comment: Glucose reference range applies only to samples taken after fasting for at least 8 hours.  ?BUN 8 - 23 mg/dL 19   36 High   39 High   31 High   27 High     ?Creatinine, Ser 0.61 - 1.24 mg/dL 1.16  1.40 High   2.01 High   2.27 High   1.24  1.26 High     ?Calcium 8.9 - 10.3 mg/dL 10.7 High    9.8  10.9 High   11.2 High   10.9 High     ?Total Protein 6.5 - 8.1 g/dL 6.9    7.5  7.0  7.1  5.6 Low    ?Albumin 3.5 - 5.0 g/dL 4.0    4.2  3.9  4.0  3.1 Low    ?AST 15 - 41 U/L 18    32  '17  16  18 '$ CM   ?ALT 0 - 44 U/L '11    20  12  12  8   '$ ?Alkaline Phosphatase 38 - 126 U/L 65    80  65  67  45   ?Total Bilirubin 0.3 - 1.2 mg/dL 0.9    1.2  0.5  0.8  1.4 High  CM   ?GFR, Estimated >60 mL/min >60   32 Low  CM  28 Low  CM  57 Low  CM  57 Low  CM    ?Comment: (NOTE)  ?Calculated using the CKD-EPI Creatinine Equation (2021)   ?Anion gap 5 - '15 8   8 '$ CM  11 CM  7 CM  8 CM    ?Comment: Performed at [facility], [address], [place], [place] [address]  ?Resulting Agency  [facility] [place] [facility] [place] [facility] [place] [facility] [place] [facility] [place] [facility] [place] [facility]  ?  ? ?  ?  ?Specimen Collected: 04/23/21 12:35 Last Resulted: 04/23/21 12:59  ?  ?  ? ?

## 2021-06-26 ENCOUNTER — Other Ambulatory Visit (HOSPITAL_COMMUNITY): Payer: Self-pay

## 2021-06-29 ENCOUNTER — Other Ambulatory Visit (HOSPITAL_COMMUNITY): Payer: Self-pay

## 2021-07-03 ENCOUNTER — Other Ambulatory Visit (HOSPITAL_COMMUNITY): Payer: Self-pay

## 2021-07-05 ENCOUNTER — Encounter: Payer: Self-pay | Admitting: Oncology

## 2021-07-05 ENCOUNTER — Other Ambulatory Visit (HOSPITAL_COMMUNITY): Payer: Self-pay

## 2021-07-13 ENCOUNTER — Emergency Department (HOSPITAL_COMMUNITY): Payer: Medicare Other

## 2021-07-13 ENCOUNTER — Encounter (HOSPITAL_COMMUNITY): Payer: Self-pay

## 2021-07-13 ENCOUNTER — Inpatient Hospital Stay (HOSPITAL_COMMUNITY)
Admission: EM | Admit: 2021-07-13 | Discharge: 2021-07-24 | DRG: 377 | Disposition: A | Discharge status: Home Health | Payer: Medicare Other | Attending: Internal Medicine | Admitting: Internal Medicine

## 2021-07-13 ENCOUNTER — Other Ambulatory Visit: Payer: Self-pay

## 2021-07-13 DIAGNOSIS — Z635 Disruption of family by separation and divorce: Secondary | ICD-10-CM

## 2021-07-13 DIAGNOSIS — C61 Malignant neoplasm of prostate: Secondary | ICD-10-CM | POA: Diagnosis present

## 2021-07-13 DIAGNOSIS — Z515 Encounter for palliative care: Secondary | ICD-10-CM

## 2021-07-13 DIAGNOSIS — C7951 Secondary malignant neoplasm of bone: Secondary | ICD-10-CM | POA: Diagnosis present

## 2021-07-13 DIAGNOSIS — Z7952 Long term (current) use of systemic steroids: Secondary | ICD-10-CM

## 2021-07-13 DIAGNOSIS — R6510 Systemic inflammatory response syndrome (SIRS) of non-infectious origin without acute organ dysfunction: Secondary | ICD-10-CM

## 2021-07-13 DIAGNOSIS — I5022 Chronic systolic (congestive) heart failure: Secondary | ICD-10-CM | POA: Diagnosis present

## 2021-07-13 DIAGNOSIS — B957 Other staphylococcus as the cause of diseases classified elsewhere: Secondary | ICD-10-CM

## 2021-07-13 DIAGNOSIS — J9811 Atelectasis: Secondary | ICD-10-CM | POA: Diagnosis not present

## 2021-07-13 DIAGNOSIS — R7881 Bacteremia: Secondary | ICD-10-CM

## 2021-07-13 DIAGNOSIS — K5521 Angiodysplasia of colon with hemorrhage: Principal | ICD-10-CM | POA: Diagnosis present

## 2021-07-13 DIAGNOSIS — D509 Iron deficiency anemia, unspecified: Secondary | ICD-10-CM | POA: Diagnosis present

## 2021-07-13 DIAGNOSIS — R627 Adult failure to thrive: Secondary | ICD-10-CM | POA: Diagnosis present

## 2021-07-13 DIAGNOSIS — I252 Old myocardial infarction: Secondary | ICD-10-CM

## 2021-07-13 DIAGNOSIS — K648 Other hemorrhoids: Secondary | ICD-10-CM | POA: Diagnosis present

## 2021-07-13 DIAGNOSIS — D125 Benign neoplasm of sigmoid colon: Secondary | ICD-10-CM | POA: Diagnosis present

## 2021-07-13 DIAGNOSIS — Z9049 Acquired absence of other specified parts of digestive tract: Secondary | ICD-10-CM

## 2021-07-13 DIAGNOSIS — G9341 Metabolic encephalopathy: Secondary | ICD-10-CM | POA: Diagnosis not present

## 2021-07-13 DIAGNOSIS — Z8719 Personal history of other diseases of the digestive system: Secondary | ICD-10-CM

## 2021-07-13 DIAGNOSIS — Z6827 Body mass index (BMI) 27.0-27.9, adult: Secondary | ICD-10-CM

## 2021-07-13 DIAGNOSIS — Z7901 Long term (current) use of anticoagulants: Secondary | ICD-10-CM

## 2021-07-13 DIAGNOSIS — I10 Essential (primary) hypertension: Secondary | ICD-10-CM | POA: Diagnosis present

## 2021-07-13 DIAGNOSIS — Z8249 Family history of ischemic heart disease and other diseases of the circulatory system: Secondary | ICD-10-CM

## 2021-07-13 DIAGNOSIS — A419 Sepsis, unspecified organism: Secondary | ICD-10-CM

## 2021-07-13 DIAGNOSIS — G8929 Other chronic pain: Secondary | ICD-10-CM | POA: Diagnosis present

## 2021-07-13 DIAGNOSIS — I5020 Unspecified systolic (congestive) heart failure: Secondary | ICD-10-CM | POA: Diagnosis present

## 2021-07-13 DIAGNOSIS — I110 Hypertensive heart disease with heart failure: Secondary | ICD-10-CM | POA: Diagnosis present

## 2021-07-13 DIAGNOSIS — Z9181 History of falling: Secondary | ICD-10-CM

## 2021-07-13 DIAGNOSIS — Z823 Family history of stroke: Secondary | ICD-10-CM

## 2021-07-13 DIAGNOSIS — A411 Sepsis due to other specified staphylococcus: Secondary | ICD-10-CM | POA: Diagnosis not present

## 2021-07-13 DIAGNOSIS — K449 Diaphragmatic hernia without obstruction or gangrene: Secondary | ICD-10-CM | POA: Diagnosis present

## 2021-07-13 DIAGNOSIS — Z66 Do not resuscitate: Secondary | ICD-10-CM | POA: Diagnosis present

## 2021-07-13 DIAGNOSIS — M549 Dorsalgia, unspecified: Secondary | ICD-10-CM | POA: Diagnosis present

## 2021-07-13 DIAGNOSIS — I480 Paroxysmal atrial fibrillation: Secondary | ICD-10-CM | POA: Diagnosis present

## 2021-07-13 DIAGNOSIS — E538 Deficiency of other specified B group vitamins: Secondary | ICD-10-CM | POA: Diagnosis present

## 2021-07-13 DIAGNOSIS — R531 Weakness: Secondary | ICD-10-CM

## 2021-07-13 DIAGNOSIS — Z79899 Other long term (current) drug therapy: Secondary | ICD-10-CM

## 2021-07-13 DIAGNOSIS — M1990 Unspecified osteoarthritis, unspecified site: Secondary | ICD-10-CM | POA: Diagnosis present

## 2021-07-13 DIAGNOSIS — K224 Dyskinesia of esophagus: Secondary | ICD-10-CM | POA: Diagnosis present

## 2021-07-13 DIAGNOSIS — D649 Anemia, unspecified: Secondary | ICD-10-CM

## 2021-07-13 DIAGNOSIS — K922 Gastrointestinal hemorrhage, unspecified: Secondary | ICD-10-CM

## 2021-07-13 DIAGNOSIS — Z7189 Other specified counseling: Secondary | ICD-10-CM

## 2021-07-13 DIAGNOSIS — K5750 Diverticulosis of both small and large intestine without perforation or abscess without bleeding: Secondary | ICD-10-CM | POA: Diagnosis present

## 2021-07-13 DIAGNOSIS — E785 Hyperlipidemia, unspecified: Secondary | ICD-10-CM | POA: Diagnosis present

## 2021-07-13 DIAGNOSIS — E876 Hypokalemia: Secondary | ICD-10-CM | POA: Diagnosis present

## 2021-07-13 LAB — CBC
HCT: 29.7 % — ABNORMAL LOW (ref 39.0–52.0)
Hemoglobin: 9.8 g/dL — ABNORMAL LOW (ref 13.0–17.0)
MCH: 28.5 pg (ref 26.0–34.0)
MCHC: 33 g/dL (ref 30.0–36.0)
MCV: 86.3 fL (ref 80.0–100.0)
Platelets: 355 10*3/uL (ref 150–400)
RBC: 3.44 MIL/uL — ABNORMAL LOW (ref 4.22–5.81)
RDW: 13.9 % (ref 11.5–15.5)
WBC: 7.6 10*3/uL (ref 4.0–10.5)
nRBC: 0 % (ref 0.0–0.2)

## 2021-07-13 LAB — URINALYSIS, ROUTINE W REFLEX MICROSCOPIC
Bacteria, UA: NONE SEEN
Bilirubin Urine: NEGATIVE
Glucose, UA: 50 mg/dL — AB
Ketones, ur: NEGATIVE mg/dL
Leukocytes,Ua: NEGATIVE
Nitrite: NEGATIVE
Protein, ur: 30 mg/dL — AB
Specific Gravity, Urine: 1.01 (ref 1.005–1.030)
pH: 6 (ref 5.0–8.0)

## 2021-07-13 LAB — BASIC METABOLIC PANEL
Anion gap: 7 (ref 5–15)
BUN: 15 mg/dL (ref 8–23)
CO2: 23 mmol/L (ref 22–32)
Calcium: 10 mg/dL (ref 8.9–10.3)
Chloride: 102 mmol/L (ref 98–111)
Creatinine, Ser: 1.05 mg/dL (ref 0.61–1.24)
GFR, Estimated: >60 mL/min (ref >60)
Glucose, Bld: 123 mg/dL — ABNORMAL HIGH (ref 70–99)
Potassium: 3.4 mmol/L — ABNORMAL LOW (ref 3.5–5.1)
Sodium: 132 mmol/L — ABNORMAL LOW (ref 135–145)

## 2021-07-13 NOTE — ED Triage Notes (Signed)
Pt brought in from home by [facility] for c/o generalized weakness since discharged from [facility] one [facility], pt also c/o bilateral wrist swelling and soreness. Pt has hx of A-fib, per EMS rate 90-120 on monitor, pt on blood thinner, pt received 500 ml LR bolus and reports feeling some better.  ?

## 2021-07-14 DIAGNOSIS — K922 Gastrointestinal hemorrhage, unspecified: Secondary | ICD-10-CM | POA: Diagnosis not present

## 2021-07-14 DIAGNOSIS — R531 Weakness: Secondary | ICD-10-CM

## 2021-07-14 DIAGNOSIS — D649 Anemia, unspecified: Secondary | ICD-10-CM | POA: Diagnosis not present

## 2021-07-14 DIAGNOSIS — E876 Hypokalemia: Secondary | ICD-10-CM

## 2021-07-14 LAB — POC OCCULT BLOOD, ED: Fecal Occult Bld: POSITIVE — AB

## 2021-07-14 LAB — CBC
HCT: 29.3 % — ABNORMAL LOW (ref 39.0–52.0)
Hemoglobin: 9.6 g/dL — ABNORMAL LOW (ref 13.0–17.0)
MCH: 28.7 pg (ref 26.0–34.0)
MCHC: 32.8 g/dL (ref 30.0–36.0)
MCV: 87.5 fL (ref 80.0–100.0)
Platelets: 326 10*3/uL (ref 150–400)
RBC: 3.35 MIL/uL — ABNORMAL LOW (ref 4.22–5.81)
RDW: 14.2 % (ref 11.5–15.5)
WBC: 6.7 10*3/uL (ref 4.0–10.5)
nRBC: 0 % (ref 0.0–0.2)

## 2021-07-14 LAB — BLOOD GAS, VENOUS
Acid-Base Excess: 2.4 mmol/L — ABNORMAL HIGH (ref 0.0–2.0)
Bicarbonate: 27.2 mmol/L (ref 20.0–28.0)
Drawn by: 5212
FIO2: 21 %
O2 Saturation: 67.5 %
Patient temperature: 37.2
pCO2, Ven: 42 mmHg — ABNORMAL LOW (ref 44–60)
pH, Ven: 7.42 (ref 7.25–7.43)
pO2, Ven: 40 mmHg (ref 32–45)

## 2021-07-14 LAB — T4, FREE: Free T4: 1.25 ng/dL — ABNORMAL HIGH (ref 0.61–1.12)

## 2021-07-14 LAB — AMMONIA: Ammonia: <10 umol/L (ref 9–35)

## 2021-07-14 LAB — TSH: TSH: 0.812 u[IU]/mL (ref 0.350–4.500)

## 2021-07-14 LAB — VITAMIN B12: Vitamin B-12: 130 pg/mL — ABNORMAL LOW (ref 180–914)

## 2021-07-14 LAB — BRAIN NATRIURETIC PEPTIDE: B Natriuretic Peptide: 389 pg/mL — ABNORMAL HIGH (ref 0.0–100.0)

## 2021-07-14 LAB — MAGNESIUM: Magnesium: 2 mg/dL (ref 1.7–2.4)

## 2021-07-14 LAB — TROPONIN I (HIGH SENSITIVITY)
Troponin I (High Sensitivity): 54 ng/L — ABNORMAL HIGH (ref <18)
Troponin I (High Sensitivity): 51 ng/L — ABNORMAL HIGH (ref <18)

## 2021-07-14 MED ORDER — SODIUM CHLORIDE 0.9 % IV SOLN: INTRAVENOUS | Status: DC

## 2021-07-14 MED ORDER — PREDNISONE 10 MG PO TABS
5.0000 mg | ORAL_TABLET | Freq: Every day | ORAL | Status: DC
  Administered 2021-07-15 – 2021-07-24 (×8): 5 mg via ORAL
  Filled 2021-07-14 (×9): qty 1

## 2021-07-14 MED ORDER — OXYCODONE HCL 5 MG PO TABS
5.0000 mg | ORAL_TABLET | ORAL | Status: DC | PRN
  Administered 2021-07-14: 5 mg via ORAL
  Filled 2021-07-14: qty 1

## 2021-07-14 MED ORDER — ATORVASTATIN CALCIUM 10 MG PO TABS
10.0000 mg | ORAL_TABLET | Freq: Every day | ORAL | Status: DC
  Administered 2021-07-14 – 2021-07-17 (×4): 10 mg via ORAL
  Filled 2021-07-14 (×5): qty 1

## 2021-07-14 MED ORDER — PEG 3350-KCL-NA BICARB-NACL 420 G PO SOLR: 4000.0000 mL | Freq: Once | ORAL | Status: DC

## 2021-07-14 MED ORDER — ONDANSETRON HCL 4 MG PO TABS: 4.0000 mg | ORAL_TABLET | Freq: Four times a day (QID) | ORAL | Status: DC | PRN

## 2021-07-14 MED ORDER — POTASSIUM CHLORIDE 10 MEQ/100ML IV SOLN
10.0000 meq | INTRAVENOUS | Status: AC
  Administered 2021-07-14 (×3): 10 meq via INTRAVENOUS
  Filled 2021-07-14 (×3): qty 100

## 2021-07-14 MED ORDER — LISINOPRIL 10 MG PO TABS
40.0000 mg | ORAL_TABLET | Freq: Every day | ORAL | Status: DC
  Administered 2021-07-14 – 2021-07-17 (×4): 40 mg via ORAL
  Filled 2021-07-14 (×5): qty 4

## 2021-07-14 MED ORDER — CINACALCET HCL 30 MG PO TABS
30.0000 mg | ORAL_TABLET | ORAL | Status: DC
  Administered 2021-07-16 – 2021-07-23 (×3): 30 mg via ORAL
  Filled 2021-07-14 (×4): qty 1

## 2021-07-14 MED ORDER — ONDANSETRON HCL 4 MG/2ML IJ SOLN
4.0000 mg | Freq: Four times a day (QID) | INTRAMUSCULAR | Status: DC | PRN
Start: 2021-07-14 — End: 2021-07-24
  Administered 2021-07-21: 4 mg via INTRAVENOUS
  Filled 2021-07-14: qty 2

## 2021-07-14 MED ORDER — ACETAMINOPHEN 325 MG PO TABS
650.0000 mg | ORAL_TABLET | Freq: Four times a day (QID) | ORAL | Status: DC | PRN
  Administered 2021-07-24: 650 mg via ORAL
  Filled 2021-07-14: qty 2

## 2021-07-14 MED ORDER — POTASSIUM CHLORIDE CRYS ER 20 MEQ PO TBCR
40.0000 meq | EXTENDED_RELEASE_TABLET | Freq: Once | ORAL | Status: AC
  Administered 2021-07-14: 40 meq via ORAL
  Filled 2021-07-14: qty 2

## 2021-07-14 MED ORDER — MORPHINE SULFATE (PF) 2 MG/ML IV SOLN: 2.0000 mg | INTRAVENOUS | Status: DC | PRN

## 2021-07-14 MED ORDER — ACETAMINOPHEN 650 MG RE SUPP
650.0000 mg | Freq: Four times a day (QID) | RECTAL | Status: DC | PRN
  Administered 2021-07-18 – 2021-07-19 (×2): 650 mg via RECTAL
  Filled 2021-07-14 (×2): qty 1

## 2021-07-14 MED ORDER — SPIRONOLACTONE 25 MG PO TABS
25.0000 mg | ORAL_TABLET | Freq: Every day | ORAL | Status: DC
  Administered 2021-07-14: 25 mg via ORAL
  Filled 2021-07-14 (×2): qty 1

## 2021-07-14 MED ORDER — SODIUM CHLORIDE 0.9 % IV SOLN: Freq: Once | INTRAVENOUS | Status: AC

## 2021-07-14 MED ORDER — FUROSEMIDE 20 MG PO TABS
20.0000 mg | ORAL_TABLET | Freq: Every day | ORAL | Status: DC
  Administered 2021-07-14 – 2021-07-17 (×4): 20 mg via ORAL
  Filled 2021-07-14 (×5): qty 1

## 2021-07-14 MED ORDER — CARVEDILOL 12.5 MG PO TABS
25.0000 mg | ORAL_TABLET | Freq: Every day | ORAL | Status: DC
  Administered 2021-07-14 – 2021-07-17 (×4): 25 mg via ORAL
  Filled 2021-07-14 (×5): qty 2

## 2021-07-14 MED ORDER — ABIRATERONE ACETATE 250 MG PO TABS: 4000.0000 mg | ORAL_TABLET | Freq: Every day | ORAL | Status: DC

## 2021-07-14 MED ORDER — PANTOPRAZOLE SODIUM 40 MG PO TBEC
40.0000 mg | DELAYED_RELEASE_TABLET | Freq: Two times a day (BID) | ORAL | Status: DC
  Administered 2021-07-14 – 2021-07-18 (×9): 40 mg via ORAL
  Filled 2021-07-14 (×9): qty 1

## 2021-07-14 NOTE — Consult Note (Signed)
?Consulting  Provider: Dr. Clearence Ped ?Primary Care Physician:  Idelle Crouch, MD ?Primary Gastroenterologist: Previously unassigned, now Dr. Abbey Chatters ? ?Reason for Consultation: Symptomatic anemia, heme positive stool ? ?HPI:  ?Carlos Perkins is a 87 y.o. male with a past medical history of dyslipidemia, hypertension, MI, CHF, paroxysmal atrial fibrillation chronically on Eliquis, who presented to [facility] ER yesterday evening with chief complaint of generalized weakness/fatigue.  Patient's family reported generalized decline over the past few months. ? ?In the ER found to have hemoglobin 9.8 with baseline around 12.  Patient denies any melena hematochezia.  Heme positive stool on evaluation. ? ?States his last colonoscopy was over 10 years ago.  Unsure of results.  Denies any heartburn, epigastric pain, dysphagia/odynophagia. ? ?No chronic NSAID use.  No chronic alcohol use.  Last Eliquis dose 07/13/2021 in the AM. ? ?Denies family history of colon cancer.  He had a CT abdomen pelvis with contrast August 2021 which showed density in the sigmoid colon concerning for polyp.  Does not appear this was ever followed up on with colonoscopy. ? ?Past Medical History:  ?Diagnosis Date  ? Arthritis   ? Hyperlipidemia   ? Hypertension   ? Myocardial infarction [facility])   ? Prostate cancer ([place])   ? Prostate CA approximately 11 years ago  ? ? ?Past Surgical History:  ?Procedure Laterality Date  ? APPENDECTOMY    ? BACK SURGERY    ? CARDIAC CATHETERIZATION    ? stent x 1  ? CHOLECYSTECTOMY    ? LUMBAR LAMINECTOMY/DECOMPRESSION MICRODISCECTOMY Left 08/20/2012  ? Procedure: LUMBAR LAMINECTOMY/DECOMPRESSION MICRODISCECTOMY 1 LEVEL;  Surgeon: Faythe Ghee, MD;  Location: [facility];  Service: Neurosurgery;  Laterality: Left;  lumbar four-five  ? PROSTATE SURGERY    ? TONSILLECTOMY    ? ? ?Prior to Admission medications   ?Medication Sig Start Date End Date Taking? Authorizing Provider  ?abiraterone acetate (ZYTIGA)  250 MG tablet TAKE 4 TABLETS (1,000 MG TOTAL) BY MOUTH DAILY. TAKE ON AN EMPTY STOMACH 1 HOUR BEFORE OR 2 HOURS AFTER A MEAL ?Patient taking differently: Take 1,000 mg by mouth daily. 06/25/21 11-Aug-2021 Yes Sindy Guadeloupe, MD  ?apixaban (ELIQUIS) 5 MG TABS tablet Take 5 mg by mouth 2 (two) times daily. 11/25/19  Yes [provider]  ?atorvastatin (LIPITOR) 10 MG tablet Take 10 mg by mouth daily.   Yes [provider]  ?carvedilol (COREG) 25 MG tablet Take 25 mg by mouth 2 (two) times daily with a meal. 10/31/17  Yes [provider]  ?cholecalciferol (VITAMIN D) 1000 units tablet Take 2,000 Units by mouth daily.   Yes [provider]  ?cinacalcet (SENSIPAR) 30 MG tablet Take 30 mg by mouth 4 (four) times a week. Monday/Wednesday/Saturday/Sunday   Yes [provider]  ?fluticasone-salmeterol (ADVAIR) 250-50 MCG/ACT AEPB Inhale 1 puff into the lungs in the morning and at bedtime.   Yes [provider]  ?furosemide (LASIX) 20 MG tablet Take 40 mg by mouth daily. 10/20/17  Yes [provider]  ?lisinopril (PRINIVIL,ZESTRIL) 40 MG tablet Take 40 mg by mouth daily. 12/02/17  Yes [provider]  ?nitroGLYCERIN (NITROSTAT) 0.4 MG SL tablet Place 0.4 mg under the tongue every 5 (five) minutes as needed for chest pain. 01/25/16  Yes [provider]  ?predniSONE (DELTASONE) 5 MG tablet Take 1 tablet by mouth once daily with breakfast 04/18/20  Yes Sindy Guadeloupe, MD  ?spironolactone (ALDACTONE) 25 MG tablet Take 12.5 mg by mouth daily.  Yes [provider]  ? ? ?Current Facility-Administered Medications  ?Medication Dose Route Frequency Provider Last Rate Last Admin  ? 0.9 %  sodium chloride infusion   Intravenous Continuous Hurshel Keys K, DO 10 mL/hr at 07/14/21 1144 Bolus from Bag at 07/14/21 1144  ? acetaminophen (TYLENOL) tablet 650 mg  650 mg Oral Q6H PRN Zierle-Ghosh, Asia B, DO      ? Or  ? acetaminophen (TYLENOL) suppository 650 mg   650 mg Rectal Q6H PRN Zierle-Ghosh, Asia B, DO      ? atorvastatin (LIPITOR) tablet 10 mg  10 mg Oral Daily Zierle-Ghosh, Asia B, DO   10 mg at 07/14/21 2119  ? carvedilol (COREG) tablet 25 mg  25 mg Oral Daily Zierle-Ghosh, Asia B, DO   25 mg at 07/14/21 4174  ? [START ON 07/16/2021] cinacalcet (SENSIPAR) tablet 30 mg  30 mg Oral Once per day on Mon Wed Fri Zierle-Ghosh, [place] B, DO      ? furosemide (LASIX) tablet 20 mg  20 mg Oral Daily Zierle-Ghosh, Asia B, DO   20 mg at 07/14/21 0814  ? lisinopril (ZESTRIL) tablet 40 mg  40 mg Oral Daily Zierle-Ghosh, Asia B, DO   40 mg at 07/14/21 4818  ? morphine (PF) 2 MG/ML injection 2 mg  2 mg Intravenous Q2H PRN Zierle-Ghosh, Asia B, DO      ? ondansetron (ZOFRAN) tablet 4 mg  4 mg Oral Q6H PRN Zierle-Ghosh, Asia B, DO      ? Or  ? ondansetron (ZOFRAN) injection 4 mg  4 mg Intravenous Q6H PRN Zierle-Ghosh, Asia B, DO      ? oxyCODONE (Oxy IR/ROXICODONE) immediate release tablet 5 mg  5 mg Oral Q4H PRN Zierle-Ghosh, Asia B, DO      ? pantoprazole (PROTONIX) EC tablet 40 mg  40 mg Oral BID Barton Dubois, MD   40 mg at 07/14/21 5631  ? polyethylene glycol-electrolytes (NuLYTELY) solution 4,000 mL  4,000 mL Oral Once Eloise Harman, DO      ? [START ON 07/15/2021] predniSONE (DELTASONE) tablet 5 mg  5 mg Oral Q breakfast Zierle-Ghosh, Asia B, DO      ? spironolactone (ALDACTONE) tablet 25 mg  25 mg Oral Daily Zierle-Ghosh, Asia B, DO   25 mg at 07/14/21 4970  ? ? ?Allergies as of 07/13/2021  ? (No Known Allergies)  ? ? ?Family History  ?Problem Relation Age of Onset  ? Stroke Mother   ? Heart attack Father   ? ? ?Social History  ? ?Socioeconomic History  ? Marital status: Married  ?  Spouse name: Not on file  ? Number of children: Not on file  ? Years of education: Not on file  ? Highest education level: Not on file  ?Occupational History  ? Not on file  ?Tobacco Use  ? Smoking status: Never  ? Smokeless tobacco: Never  ?Vaping Use  ? Vaping Use: Never used  ?Substance and  Sexual Activity  ? Alcohol use: No  ? Drug use: No  ? Sexual activity: Not Currently  ?Other Topics Concern  ? Not on file  ?Social History Narrative  ? Not on file  ? ?Social Determinants of Health  ? ?Financial Resource Strain: Not on file  ?Food Insecurity: Not on file  ?Transportation Needs: Not on file  ?Physical Activity: Not on file  ?Stress: Not on file  ?Social Connections: Not on file  ?Intimate Partner Violence: Not on file  ? ? ?  Review of Systems: ?General: Negative for anorexia, weight loss, fever, chills, fatigue, weakness. ?Eyes: Negative for vision changes.  ?ENT: Negative for hoarseness, difficulty swallowing , nasal congestion. ?CV: Negative for chest pain, angina, palpitations, dyspnea on exertion, peripheral edema.  ?Respiratory: Negative for dyspnea at rest, dyspnea on exertion, cough, sputum, wheezing.  ?GI: See history of present illness. ?GU:  Negative for dysuria, hematuria, urinary incontinence, urinary frequency, nocturnal urination.  ?MS: Negative for joint pain, low back pain.  ?Derm: Negative for rash or itching.  ?Neuro: Negative for weakness, abnormal sensation, seizure, frequent headaches, memory loss, confusion.  ?Psych: Negative for anxiety, depression ?Endo: Negative for unusual weight change.  ?Heme: Negative for bruising or bleeding. ?Allergy: Negative for rash or hives. ? ?Physical Exam: ?Vital signs in last 24 hours: ?Temp:  [97.7 ?F (36.5 ?C)-100.3 ?F (37.9 ?C)] 99 ?F (37.2 ?C) (04/15 1334) ?Pulse Rate:  [25-95] 78 (04/15 1334) ?Resp:  [17-27] 18 (04/15 1334) ?BP: (110-151)/(69-130) 129/74 (04/15 1334) ?SpO2:  [93 %-100 %] 98 % (04/15 1334) ?Weight:  [86.2 kg] 86.2 kg (04/14 2058) ?Last BM Date : 07/13/21 ?General:   Alert,  Well-developed, well-nourished, pleasant and cooperative in NAD ?Head:  Normocephalic and atraumatic. ?Eyes:  Sclera clear, no icterus.   Conjunctiva pink. ?Ears:  Normal auditory acuity. ?Nose:  No deformity, discharge,  or lesions. ?Mouth:  No deformity  or lesions, dentition normal. ?Neck:  Supple; no masses or thyromegaly. ?Lungs:  Clear throughout to auscultation.   No wheezes, crackles, or rhonchi. No acute distress. ?Heart:  Regular rate and rhythm

## 2021-07-14 NOTE — Assessment & Plan Note (Addendum)
-  multifactorial.indluding symptomatic anemia; infection, low B12, electrolyte derangements; deconditioning ?-TSH within normal limits ?-B12 significantly low and will be repleted. ?-Gentle fluid resuscitation has been provided and electrolytes have been repleted. ?-CT head without acute intracranial normalities. ?-Ammonia is less than 10 ?-Baseline hemoglobin is 11 ?

## 2021-07-14 NOTE — ED Notes (Signed)
Pt returned from CT scan.

## 2021-07-14 NOTE — Assessment & Plan Note (Addendum)
-  Potassium 3.4 at time of admission ?-Repleted and within normal limits currently. ?

## 2021-07-14 NOTE — Plan of Care (Signed)
?  Problem: Acute Rehab PT Goals(only PT should resolve) ?Goal: Pt Will Go Supine/Side To Sit ?Outcome: Progressing ?Flowsheets (Taken 07/14/2021 1141) ?Pt will go Supine/Side to Sit: ? with minimal assist ? with min guard assist ?Goal: Pt Will Go Sit To Supine/Side ?Outcome: Progressing ?Flowsheets (Taken 07/14/2021 1141) ?Pt will go Sit to Supine/Side: ? with minimal assist ? with min guard assist ?Goal: Patient Will Transfer Sit To/From Stand ?Outcome: Progressing ?Flowsheets (Taken 07/14/2021 1141) ?Patient will transfer sit to/from stand: ? with min guard assist ? with minimal assist ?Goal: Pt Will Transfer Bed To Chair/Chair To Bed ?Outcome: Progressing ?Flowsheets (Taken 07/14/2021 1141) ?Pt will Transfer Bed to Chair/Chair to Bed: ? with min assist ? min guard assist ?Goal: Pt/caregiver will Perform Home Exercise Program ?Outcome: Progressing ?Flowsheets (Taken 07/14/2021 1141) ?Pt/caregiver will Perform Home Exercise Program: ? For increased strengthening ? For improved balance ? Independently ? 11:41 AM, 07/14/21 ?Mearl Latin PT, DPT ?Physical Therapist at [facility] ?[facility] ? ?

## 2021-07-14 NOTE — Evaluation (Signed)
Physical Therapy Evaluation ?Patient Details ?Name: Carlos Perkins ?MRN: [id number] ?DOB: [birth year] ?Today's Date: 07/14/2021 ? ?History of Present Illness ? Carlos Perkins is a 87 y.o. male with medical history significant of hyperlipidemia, hypertension, myocardial infarction, CHF, paroxysmal atrial fibrillation, and more presents the ED with a chief complaint of generalized weakness.  Patient's family had reported to the ED provider that patient has had a general decline over a couple of months.  He lost his closest living relative 2 months ago, and has been declining since then.  Patient reports that he had a fall 2 weeks ago on Monday.  Since then he thinks he has been getting worse.  He reports he cannot stand up mostly due to weakness, but also pain.  He has sharp pains in his toes when he tries to stand.  He reports the weakness and the pains are equal in the bilateral lower extremities.  He also has sharp pains in his wrist.  His grip strength is significantly decreased because of this.  He reports the symptoms have been present for 2 weeks.  He denies any fatigue.  He reports he is short of breath but its not exertional.  He denies chest pain, hematochezia, melena, fever.  He denies asymmetric weakness, numbness, or hypersensitivity.  He has no other complaints at this time. ?  ?Clinical Impression ? Patient limited for functional mobility as stated below secondary to generalized weakness, fatigue and impaired sitting balance. Patient typically a community ambulator without AD with acute decline in status over last few weeks. Patient very lethargic today but does arouse for questioning and mobility. Patient requiring frequent cueing for sequencing and encouragement along with mod assist to transition to seated EOB. He initially demonstrates poor sitting balance but improves after being assisted for a minute. Demonstrates fair sitting balance EOB but is limited by fatigue requesting to return to  bed. Patient may benefit from inpatient rehab if arousal level improves over next few days otherwise, he will likely require SNF rehab before returning home. Patient will benefit from continued physical therapy in hospital and recommended venue below to increase strength, balance, endurance for safe ADLs and gait. ?   ?   ? ?Recommendations for follow up therapy are one component of a multi-disciplinary discharge planning process, led by the attending physician.  Recommendations may be updated based on patient status, additional functional criteria and insurance authorization. ? ?Follow Up Recommendations Acute inpatient rehab (3hours/day) ? ?  ?Assistance Recommended at Discharge Intermittent Supervision/Assistance  ?Patient can return home with the following ? A lot of help with walking and/or transfers;A lot of help with bathing/dressing/bathroom;Assistance with cooking/housework;Help with stairs or ramp for entrance ? ?  ?Equipment Recommendations None recommended by PT  ?Recommendations for Other Services ?    ?  ?Functional Status Assessment Patient has had a recent decline in their functional status and demonstrates the ability to make significant improvements in function in a reasonable and predictable amount of time.  ? ?  ?Precautions / Restrictions Precautions ?Precautions: Fall ?Restrictions ?Weight Bearing Restrictions: No  ? ?  ? ?Mobility ? Bed Mobility ?Overal bed mobility: Needs Assistance ?Bed Mobility: Supine to Sit, Sit to Supine ?  ?  ?Supine to sit: Mod assist, HOB elevated ?Sit to supine: Mod assist ?  ?General bed mobility comments: slow, labored, frequent cueing, assist to upright trunk and for final LE movement to EOB ?  ? ?Transfers ?  ?  ?  ?  ?  ?  ?  ?  ?  ?  ?  ? ?  Ambulation/Gait ?  ?  ?  ?  ?  ?  ?  ?  ? ?Stairs ?  ?  ?  ?  ?  ? ?Wheelchair Mobility ?  ? ?Modified Rankin (Stroke Patients Only) ?  ? ?  ? ?Balance Overall balance assessment: Needs assistance ?Sitting-balance support:  Feet supported ?Sitting balance-Leahy Scale: Fair ?Sitting balance - Comments: fair/poor ?  ?  ?  ?  ?  ?  ?  ?  ?  ?  ?  ?  ?  ?  ?  ?   ? ? ? ?Pertinent Vitals/Pain Pain Assessment ?Pain Assessment: No/denies pain  ? ? ?Home Living Family/patient expects to be discharged to:: Private residence ?Living Arrangements: Children ?Available Help at Discharge: Family ?Type of Home: House ?Home Access: Level entry ?  ?  ?  ?Home Layout: One level ?Home Equipment: Conservation officer, nature (2 wheels);Rollator (4 wheels);Shower seat;Transport chair ?   ?  ?Prior Function Prior Level of Function : Independent/Modified Independent ?  ?  ?  ?  ?  ?  ?Mobility Comments: community ambulation without AD ?ADLs Comments: independent ?  ? ? ?Hand Dominance  ?   ? ?  ?Extremity/Trunk Assessment  ? Upper Extremity Assessment ?Upper Extremity Assessment: Defer to OT evaluation ?  ? ?Lower Extremity Assessment ?Lower Extremity Assessment: Generalized weakness ?  ? ?Cervical / Trunk Assessment ?Cervical / Trunk Assessment: Normal  ?Communication  ? Communication: HOH  ?Cognition Arousal/Alertness: Lethargic ?Behavior During Therapy: [facility] for tasks assessed/performed ?Overall Cognitive Status: Within Functional Limits for tasks assessed ?  ?  ?  ?  ?  ?  ?  ?  ?  ?  ?  ?  ?  ?  ?  ?  ?  ?  ?  ? ?  ?General Comments   ? ?  ?Exercises    ? ?Assessment/Plan  ?  ?PT Assessment Patient needs continued PT services  ?PT Problem List Decreased strength;Decreased mobility;Decreased activity tolerance;Decreased balance ? ?   ?  ?PT Treatment Interventions Therapeutic exercise;DME instruction;Gait training;Balance training;Neuromuscular re-education;Functional mobility training;Therapeutic activities;Patient/family education;Stair training   ? ?PT Goals (Current goals can be found in the Care Plan section)  ?Acute Rehab PT Goals ?Patient Stated Goal: Return home ?PT Goal Formulation: With patient/family ?Time For Goal Achievement: 07/28/21 ?Potential to Achieve  Goals: Fair ? ?  ?Frequency Min 4X/week ?  ? ? ?Co-evaluation   ?  ?  ?  ?  ? ? ?  ?AM-PAC PT "6 Clicks" Mobility  ?Outcome Measure Help needed turning from your back to your side while in a flat bed without using bedrails?: A Little ?Help needed moving from lying on your back to sitting on the side of a flat bed without using bedrails?: A Lot ?Help needed moving to and from a bed to a chair (including a wheelchair)?: A Lot ?Help needed standing up from a chair using your arms (e.g., wheelchair or bedside chair)?: A Lot ?Help needed to walk in hospital room?: Total ?Help needed climbing 3-5 steps with a railing? : Total ?6 Click Score: 11 ? ?  ?End of Session   ?Activity Tolerance: Patient tolerated treatment well;Patient limited by lethargy ?Patient left: in bed;with call bell/phone within reach;with family/visitor present ?Nurse Communication: Mobility status ?PT Visit Diagnosis: Unsteadiness on feet (R26.81);Other abnormalities of gait and mobility (R26.89);History of falling (Z91.81);Muscle weakness (generalized) (M62.81) ?  ? ?Time: 7858-8502 ?PT Time Calculation (min) (ACUTE ONLY): 23 min ? ? ?Charges:  PT Evaluation ?$PT Eval Moderate Complexity: 1 Mod ?PT Treatments ?$Therapeutic Activity: 8-22 mins ?  ?   ? ? ?11:40 AM, 07/14/21 ?Mearl Latin PT, DPT ?Physical Therapist at [facility] ?[facility] ? ? ?

## 2021-07-14 NOTE — Assessment & Plan Note (Addendum)
-  Hemoglobin baseline around 11 range. ?-FOBT positive ?-Denies hematochezia or melena ?-Appreciate assistance and recommendation by GI therapy ?-Pending (EGD and colonoscopy), hopefully tomorrow. ?-Continue holding anticoagulation. ?

## 2021-07-14 NOTE — Assessment & Plan Note (Addendum)
-  holding coreg due to soft BPs ?-Currently in A-fib, rate controlled ?-Continue telemetry monitoring ?-Holding anticoagulation in the setting of positive fecal occult blood test and need for endoscopic evaluation. ?-EGD/colon unrevealing>>restart apixaban 4/24 ?

## 2021-07-14 NOTE — Care Management Obs Status (Signed)
MEDICARE OBSERVATION STATUS NOTIFICATION ? ? ?Patient Details  ?Name: Carlos Perkins ?MRN: [id number] ?Date of Birth: [birth year] ? ? ?Medicare Observation Status Notification Given:  Yes ? ? ? ?Iona Beard, LCSWA ?07/14/2021, 9:23 AM ?

## 2021-07-14 NOTE — Progress Notes (Signed)
Patient seen and examined.  Admitted after midnight secondary to generalized weakness.  Hemodynamically stable and found with positive fecal occult blood test and a decreasing hemoglobin from baseline around 12-9.8.  Patient denies hematochezia, melena, hematemesis or abdominal pain.  He is chronically on Eliquis for secondary prevention due to history of paroxysmal atrial fibrillation.  CT head negative for acute intracranial normalities, no acute signs of infection on chest x-ray and has a normal TSH and WBCs level.  Please refer to H&P written by Dr. Clearence Ped for further info/details on admission. ? ?Plan: ?-Follow recommendations by physical therapy evaluation ?-Patient will be started on PPI ?-Continue to follow hemoglobin trend ?-Follow recommendations by gastroenterology service. ?-Will check vitamin D in order to complete work-up. ?-Continue adequate hydration and replete electrolytes. ?-Follow clinical response. ? ?Barton Dubois MD ?[phone] ? ?

## 2021-07-14 NOTE — Assessment & Plan Note (Addendum)
-  Continue Coreg and ACE initially>>hold now as pt cannot take po due to lethargy and soft BPs ?-Continue to follow vital signs. ?-BPs remain acceptable/controlled ?

## 2021-07-14 NOTE — ED Provider Notes (Signed)
?[facility] ?Provider Note ? ? ?CSN: [id number] ?Arrival date & time: 07/13/21  2049 ? ?  ? ?History ? ?Chief Complaint  ?Patient presents with  ? generalized weakness  ? ? ?Daivd Lourdes Manning is a 87 y.o. male. ? ?87 year old male who presents to the ER today for generalized weakness.  Patient is not sure when it started he states that a couple weeks ago he had a fall when he had difficulty climbing up a curb.  He states that his weakness started before that had progressively worsened.  2 to 3 days after the fall his weakness got worse and he was having pain in his knees and difficulty walking so he went to [facility].  He had x-rays done of his lower extremities and a CT done of his lower back which were unrevealing.  Patient was sent home with home health services.  PT OT and nursing have been out this week and plan to continue working with them for the time being.  He lives by himself but does get help from some of his family members.  Something is a worsening generalized weakness.  He has lower extremity edema which is slightly worse.  He also has edema in his hands is worse.  No shortness of breath, cough or fever.  States he has trouble sitting up on edge of bed or walking or doing anything to try to help take care of himself.  No recent trauma.  He has a history of prostate cancer being treated with immunotherapy and steroids.  This is palliative from what I am understanding but expected to help long-term.  Review of the records it shows that he does have diffuse osseous metastatic disease previously but has had multiple scans that have been negative for any ongoing disease. ? ? ? ?  ? ?Home Medications ?Prior to Admission medications   ?Medication Sig Start Date End Date Taking? Authorizing Provider  ?abiraterone acetate (ZYTIGA) 250 MG tablet TAKE 4 TABLETS (1,000 MG TOTAL) BY MOUTH DAILY. TAKE ON AN EMPTY STOMACH 1 HOUR BEFORE OR 2 HOURS AFTER A MEAL 06/25/21 08-14-2021  Sindy Guadeloupe, MD   ?apixaban [name]) 5 MG TABS tablet Take by mouth. 11/25/19   [provider]  ?atorvastatin (LIPITOR) 10 MG tablet Take 10 mg by mouth daily.    [provider]  ?carvedilol (COREG) 25 MG tablet Take 25 mg by mouth daily. 10/31/17   [provider]  ?cholecalciferol (VITAMIN D) 1000 units tablet Take 2,000 Units by mouth daily.    [provider]  ?cinacalcet (SENSIPAR) 30 MG tablet Take 30 mg by mouth 3 (three) times a week.    [provider]  ?furosemide (LASIX) 20 MG tablet Take 20 mg by mouth daily. 10/20/17   [provider]  ?lisinopril (PRINIVIL,ZESTRIL) 40 MG tablet Take 40 mg by mouth daily. 12/02/17   [provider]  ?nitroGLYCERIN (NITROSTAT) 0.4 MG SL tablet Place 0.4 mg under the tongue every 5 (five) minutes as needed. 01/25/16   [provider]  ?predniSONE (DELTASONE) 5 MG tablet Take 1 tablet by mouth once daily with breakfast 04/18/20   Sindy Guadeloupe, MD  ?spironolactone (ALDACTONE) 25 MG tablet Take 25 mg by mouth daily.    [provider]  ?   ? ?Allergies    ?Patient has no known allergies.   ? ?Review of Systems   ?Review of Systems ? ?Physical Exam ?Updated Vital Signs ?BP (!) 145/70   Pulse  66   Temp 100.3 ?F (37.9 ?C) (Rectal)   Resp 19   Ht '5\' 10"'$  (1.778 m)   Wt 86.2 kg   SpO2 96%   BMI 27.26 kg/m?  ?Physical Exam ?Vitals and nursing note reviewed.  ?Constitutional:   ?   Appearance: He is well-developed.  ?HENT:  ?   Head: Normocephalic and atraumatic.  ?   Nose: Nose normal. No congestion or rhinorrhea.  ?   Mouth/Throat:  ?   Mouth: Mucous membranes are moist.  ?   Pharynx: Oropharynx is clear.  ?Eyes:  ?   Pupils: Pupils are equal, round, and reactive to light.  ?Cardiovascular:  ?   Rate and Rhythm: Normal rate.  ?Pulmonary:  ?   Effort: Pulmonary effort is normal. No respiratory distress.  ?Abdominal:  ?   General: Abdomen is flat. There is no distension.  ?Musculoskeletal:     ?   General: Normal  range of motion.  ?   Cervical back: Normal range of motion.  ?Skin: ?   General: Skin is warm and dry.  ?   Findings: Erythema (dorsum of bilateral wrists, also warm to touch and tender to touch) present.  ?Neurological:  ?   Mental Status: He is alert.  ? ? ?ED Results / Procedures / Treatments   ?Labs ?(all labs ordered are listed, but only abnormal results are displayed) ?Labs Reviewed  ?BASIC METABOLIC PANEL - Abnormal; Notable for the following components:  ?    Result Value  ? Sodium 132 (*)   ? Potassium 3.4 (*)   ? Glucose, Bld 123 (*)   ? All other components within normal limits  ?CBC - Abnormal; Notable for the following components:  ? RBC 3.44 (*)   ? Hemoglobin 9.8 (*)   ? HCT 29.7 (*)   ? All other components within normal limits  ?URINALYSIS, ROUTINE W REFLEX MICROSCOPIC - Abnormal; Notable for the following components:  ? APPearance HAZY (*)   ? Glucose, UA 50 (*)   ? Hgb urine dipstick SMALL (*)   ? Protein, ur 30 (*)   ? All other components within normal limits  ?BRAIN NATRIURETIC PEPTIDE - Abnormal; Notable for the following components:  ? B Natriuretic Peptide 389.0 (*)   ? All other components within normal limits  ?BLOOD GAS, VENOUS - Abnormal; Notable for the following components:  ? pCO2, Ven 42 (*)   ? Acid-Base Excess 2.4 (*)   ? All other components within normal limits  ?POC OCCULT BLOOD, ED - Abnormal; Notable for the following components:  ? Fecal Occult Bld POSITIVE (*)   ? All other components within normal limits  ?TROPONIN I (HIGH SENSITIVITY) - Abnormal; Notable for the following components:  ? Troponin I (High Sensitivity) 54 (*)   ? All other components within normal limits  ?TROPONIN I (HIGH SENSITIVITY) - Abnormal; Notable for the following components:  ? Troponin I (High Sensitivity) 51 (*)   ? All other components within normal limits  ?CULTURE, BLOOD (ROUTINE X 2)  ?CULTURE, BLOOD (ROUTINE X 2)  ?MAGNESIUM  ?AMMONIA  ?TSH  ?T4, FREE  ?CALCIUM, IONIZED  ?VITAMIN B1   ?VITAMIN B12  ?CBC  ?CBG MONITORING, ED  ? ? ?EKG ?EKG Interpretation ? ?Date/Time:  Friday July 13 2021 21:04:49 EDT ?Ventricular Rate:  91 ?PR Interval:    ?QRS Duration: 125 ?QT Interval:  382 ?QTC Calculation: 470 ?R Axis:   -8 ?Text Interpretation: Age not entered, assumed to be  87 years old for purpose of ECG interpretation Atrial fibrillation Ventricular premature complex Right bundle branch block Confirmed by Merrily Pew [phone]) on 07/13/2021 11:08:44 PM ? ?Radiology ?DG Wrist Complete Left ? ?Result Date: 07/14/2021 ?CLINICAL DATA:  Generalized weakness since being discharged from [facility] 1 week ago. Also complains of bilateral wrist swelling and soreness. EXAM: LEFT WRIST - COMPLETE 3+ VIEW COMPARISON:  None. FINDINGS: There is no evidence of an acute fracture or dislocation. A chronic fracture deformity is seen involving the distal shaft of the left radius. An additional chronic fracture of the left ulnar styloid is noted. Marked severity degenerative changes are noted throughout the left wrist with a chronic deformity is seen involving the left scaphoid bone. There is mild diffuse soft tissue swelling. IMPRESSION: 1. Marked severity degenerative changes without an acute fracture or dislocation. 2. Chronic fractures of the distal left radius and left ulnar styloid. Electronically Signed   By: Virgina Norfolk M.D.   On: 07/14/2021 00:21  ? ?DG Wrist Complete Right ? ?Result Date: 07/14/2021 ?CLINICAL DATA:  Generalized weakness since being discharged from [place] 1 week ago with complaints of bilateral wrist swelling and soreness. EXAM: RIGHT WRIST - COMPLETE 3+ VIEW COMPARISON:  None. FINDINGS: There is no evidence of fracture or dislocation. Mild degenerative changes are seen along the radiocarpal joint and along the carpal metacarpal articulation of the right thumb. Mild diffuse soft tissue swelling is seen. IMPRESSION: No acute osseous abnormality. Electronically Signed   By: Virgina Norfolk M.D.   On: 07/14/2021 00:23  ? ?CT Head Wo Contrast ? ?Result Date: 07/14/2021 ?CLINICAL DATA:  Generalized weakness since being discharged from [facility] 1 week ago. EXAM: CT HEAD WITHOUT CONTRAST TECHNIQUE

## 2021-07-14 NOTE — Assessment & Plan Note (Addendum)
-  holding Lasix, Coreg, ACE and spironolactone as an outpatient. ?-Patient swelling has improved ?-Continue to follow daily weights and strict I's and O's. ? ?

## 2021-07-14 NOTE — Progress Notes (Signed)
Inpatient Rehab Admissions Coordinator:   Per therapy recommendations, patient was screened for CIR candidacy by Zakyah Yanes, MS, CCC-SLP. At this time, Pt. does not appear to demonstrate medical necessity to justify in hospital rehabilitation/CIR. will not pursue a rehab consult for this Pt.   Recommend other rehab venues to be pursued.  Please contact me with any questions.  Dalisha Shively, MS, CCC-SLP Rehab Admissions Coordinator  [phone] (celll) [phone] (office)   

## 2021-07-14 NOTE — H&P (Signed)
?History and Physical  ? ? ?Patient: Carlos Perkins BJS:[id number] DOB: May 02, 1934 ?DOA: 07/13/2021 ?DOS: the patient was seen and examined on 07/14/2021 ?PCP: Idelle Crouch, MD  ?Patient coming from: Home ? ?Chief Complaint:  ?Chief Complaint  ?Patient presents with  ? generalized weakness  ? ?HPI: Carlos Perkins is a 87 y.o. male with medical history significant of hyperlipidemia, hypertension, myocardial infarction, CHF, paroxysmal atrial fibrillation, and more presents the ED with a chief complaint of generalized weakness.  Patient's family had reported to the ED provider that patient has had a general decline over a couple of months.  He lost his closest living relative 2 months ago, and has been declining since then.  Patient reports that he had a fall 2 weeks ago on Monday.  Since then he thinks he has been getting worse.  He reports he cannot stand up mostly due to weakness, but also pain.  He has sharp pains in his toes when he tries to stand.  He reports the weakness and the pains are equal in the bilateral lower extremities.  He also has sharp pains in his wrist.  His grip strength is significantly decreased because of this.  He reports the symptoms have been present for 2 weeks.  He denies any fatigue.  He reports he is short of breath but its not exertional.  He denies chest pain, hematochezia, melena, fever.  He denies asymmetric weakness, numbness, or hypersensitivity.  He has no other complaints at this time. ? ?Patient does not smoke, does not drink alcohol, does not use illicit drugs.  He is vaccinated for COVID.  Patient is DNR. ?Review of Systems: As mentioned in the history of present illness. All other systems reviewed and are negative. ?Past Medical History:  ?Diagnosis Date  ? Arthritis   ? Hyperlipidemia   ? Hypertension   ? Myocardial infarction [facility])   ? Prostate cancer ([place])   ? Prostate CA approximately 11 years ago  ? ?Past Surgical History:  ?Procedure Laterality Date  ?  APPENDECTOMY    ? BACK SURGERY    ? CARDIAC CATHETERIZATION    ? stent x 1  ? CHOLECYSTECTOMY    ? LUMBAR LAMINECTOMY/DECOMPRESSION MICRODISCECTOMY Left 08/20/2012  ? Procedure: LUMBAR LAMINECTOMY/DECOMPRESSION MICRODISCECTOMY 1 LEVEL;  Surgeon: Faythe Ghee, MD;  Location: [facility];  Service: Neurosurgery;  Laterality: Left;  lumbar four-five  ? PROSTATE SURGERY    ? TONSILLECTOMY    ? ?Social History:  reports that he has never smoked. He has never used smokeless tobacco. He reports that he does not drink alcohol and does not use drugs. ? ?No Known Allergies ? ?Family History  ?Problem Relation Age of Onset  ? Stroke Mother   ? Heart attack Father   ? ? ?Prior to Admission medications   ?Medication Sig Start Date End Date Taking? Authorizing Provider  ?abiraterone acetate (ZYTIGA) 250 MG tablet TAKE 4 TABLETS (1,000 MG TOTAL) BY MOUTH DAILY. TAKE ON AN EMPTY STOMACH 1 HOUR BEFORE OR 2 HOURS AFTER A MEAL 06/25/21 07-28-21  Sindy Guadeloupe, MD  ?apixaban [name]) 5 MG TABS tablet Take by mouth. 11/25/19   [provider]  ?atorvastatin (LIPITOR) 10 MG tablet Take 10 mg by mouth daily.    [provider]  ?carvedilol (COREG) 25 MG tablet Take 25 mg by mouth daily. 10/31/17   [provider]  ?cholecalciferol (VITAMIN D) 1000 units tablet Take 2,000 Units by mouth daily.    [provider]  ?cinacalcet (SENSIPAR) 30 MG tablet Take 30 mg by mouth 3 (three) times a week.    [provider]  ?furosemide (LASIX) 20 MG tablet Take 20 mg by mouth daily. 10/20/17   [provider]  ?lisinopril (PRINIVIL,ZESTRIL) 40 MG tablet Take 40 mg by mouth daily. 12/02/17   [provider]  ?nitroGLYCERIN (NITROSTAT) 0.4 MG SL tablet Place 0.4 mg under the tongue every 5 (five) minutes as needed. 01/25/16   [provider]  ?predniSONE (DELTASONE) 5 MG tablet Take 1 tablet by mouth once daily with breakfast 04/18/20   Sindy Guadeloupe, MD  ?spironolactone (ALDACTONE) 25  MG tablet Take 25 mg by mouth daily.    [provider]  ? ? ?Physical Exam: ?Vitals:  ? 07/14/21 0500 07/14/21 0530 07/14/21 0545 07/14/21 0600  ?BP: 139/86 130/84  123/72  ?Pulse: (!) 25  (!) 54   ?Resp: 18  17   ?Temp:      ?TempSrc:      ?SpO2: 99%   100%  ?Weight:      ?Height:      ? ?1.  General: ?Patient lying supine in bed, chronically ill-appearing, no acute distress ?  ?2. Psychiatric: ?Somnolent-repeatedly falling asleep during questions, and oriented x 3, mood and behavior normal for situation, pleasant and cooperative with exam ?  ?3. Neurologic: ?Speech and language are normal, face is symmetric, moves all 4 extremities voluntarily, decreased grip strength bilaterally, but more on right than left, decreased strength in the lower extremities, but not as affected as the upper extremities,  ?  ?4. HEENMT:  ?Head is atraumatic, normocephalic, pupils reactive to light, neck is supple, trachea is midline, mucous membranes are moist ?  ?5. Respiratory : ?Lungs are clear to auscultation bilaterally without wheezing, rhonchi, rales, no cyanosis, no increase in work of breathing or accessory muscle use ?  ?6. Cardiovascular : ?Heart rate normal, rhythm is irregularly irregular, no murmurs, rubs or gallops, peripheral edema present, peripheral pulses palpated ?  ?7. Gastrointestinal:  ?Abdomen is soft, nondistended, nontender to palpation bowel sounds active, no masses or organomegaly palpated ?  ?8. Skin:  ?Skin is warm, dry and intact without rashes, acute lesions, or ulcers on limited exam ?  ?9.Musculoskeletal:  ?No acute deformities or trauma, no asymmetry in tone, peripheral edema present, peripheral pulses palpated, no tenderness to palpation in the extremities ? ?Data Reviewed: ?In the ED ?Temp 99, heart rate 25-95, respiratory rate 18-27, blood pressure 110/69-151/130 satting at 99% ?Troponin at baseline, but also downtrending-54, 51 ?FOBT positive ?CT head shows no acute intracranial  process ?Left wrist and right wrist x-rayed no acute osseous abnormality ?Chest x-ray shows no acute findings ?Blood cultures pending ?No leukocytosis with a white blood cell count of 7.6, hemoglobin is lower than baseline at 9.8, platelets 355 ?Chemistry shows a hypokalemia 3.4 ?BNP 389 ?UA negative for UTI ?TSH 0.812, ammonia less than 10 ?VBG shows a pH of 7.4, PCO2 42 ?Patient does not have a safe discharge plan as he is no longer able to be cared for at home.  Admission for anemia, hypokalemia, PT evaluation and possible placement ? ?Assessment and Plan: ?* Generalized weakness ?- Most likely secondary to deconditioning ?-TSH normal ?-Check thiamine and B12 ?-CT head shows no acute intracranial processes ?-Chest x-ray and UA are negative for infection ?-Ammonia is less than 10 ?-Troponin is at baseline, and downtrending 54, 51 ?-PT eval, will likely need placement with inpatient rehab ?-Patient is slightly  anemic with a hemoglobin of 9.8 ?-Baseline hemoglobin is 12 ?-Possibly symptomatic anemia? ?-Trend another CBC now, if continuing to decrease consider transfusion ?-Continue to monitor ? ?GI bleed ?- Hemoglobin baseline 12-12 0.4 ?-Hemoglobin today 9.8 ?-FOBT positive ?-Denies hematochezia or melena ?-Consult gastro ? ?Hypokalemia ?Potassium 3.4 ?Likely secondary to poor p.o. intake ?30 mEq IV potassium ordered ?Monitor on telemetry ? ? ?Anemia ?- Hemoglobin baseline 12.4 ?-Today 9.8 ?-FOBT positive ?-Trend with another hemoglobin now ?-Consult gastro ? ?PAF (paroxysmal atrial fibrillation) ([place]) ?Continue beta-blocker and Eliquis ?Currently in A-fib, rate controlled ?Continue to monitor ? ?Heart failure with reduced ejection fraction ([place]) ?- Continue statin, Coreg, ACE, Aldactone ?-Peripheral edema present, but not in acute exacerbation ?-Continue 20 mg p.o. Lasix from home meds ? ?Hypertension ?Continue Coreg and ACE ?Continue to monitor ? ?Hyperlipidemia, unspecified ?Continue statin ? ? ? ? ? Advance  Care Planning:   Code Status: Prior DNR ? ?Consults: PT, TOC, gastro ? ?Family Communication: No family at bedside ? ?Severity of Illness: ?The appropriate patient status for this patient is OBSERVATION. Observat

## 2021-07-14 NOTE — Assessment & Plan Note (Addendum)
Continue statin. 

## 2021-07-14 NOTE — TOC Initial Note (Addendum)
Transition of Care (TOC) - Initial/Assessment Note  ? ? ?Patient Details  ?Name: Carlos Perkins ?MRN: [id number] ?Date of Birth: 27-Nov-1934 ? ?Transition of Care (TOC) CM/SW Contact:    ?Iona Beard, LCSWA ?Phone Number: ?07/14/2021, 2:29 PM ? ?Clinical Narrative:                 ?TOC consulted for possible SNF placement. CSW spoke with family in room to explain that CIR was denied as pt is not a candidate. CSW explained that SNF would be next option. Family states that pt has [facility] and would like to take pt home with resumption of those services. CSW spoke to [place] with [facility] who states that pt has [facility] PT/OT/RN/SW/Aide services. Family also requests that pt get BSC. CSW reached out to [place] with [employer] who will work on getting the [facility] ordered for pt. CSW asked MD for [facility] and DME orders. TOC to follow.  ? ?Expected Discharge Plan: [place] ?Barriers to Discharge: Continued Medical Work up ? ? ?Patient Goals and CMS Choice ?Patient states their goals for this hospitalization and ongoing recovery are:: Home with HH ?CMS Medicare.gov Compare Post Acute Care list provided to:: Patient ?Choice offered to / list presented to : Patient, [facility] POA / Guardian, Adult Children ? ?Expected Discharge Plan and Services ?Expected Discharge Plan: [place] ?In-house Referral: Clinical Social Work ?  ?Post Acute Care Choice: Home Health, Durable Medical Equipment ?Living arrangements for the past 2 months: [place] ?                ?  ?  ?  ?  ?  ?  ?  ?  ?  ?  ? ?Prior Living Arrangements/Services ?Living arrangements for the past 2 months: [place] ?Lives with:: Relatives ?  ?       ?  ?  ?Current home services: DME ?  ? ?Activities of Daily Living ?Home Assistive Devices/Equipment: Gilford Rile (specify type), Cane (specify quad or straight), Shower chair without back ?ADL Screening (condition at time of admission) ?Patient's cognitive ability adequate to safely complete  daily activities?: Yes ?Is the patient deaf or have difficulty hearing?: Yes ?Does the patient have difficulty seeing, even when wearing glasses/contacts?: No ?Does the patient have difficulty concentrating, remembering, or making decisions?: No ?Patient able to express need for assistance with ADLs?: Yes ?Does the patient have difficulty dressing or bathing?: Yes ?Independently performs ADLs?: No ?Communication: Independent ?Dressing (OT): Needs assistance ?Is this a change from baseline?: Pre-admission baseline ?Grooming: Independent ?Feeding: Independent ?Bathing: Needs assistance ?Is this a change from baseline?: Pre-admission baseline ?Toileting: Needs assistance ?Is this a change from baseline?: Pre-admission baseline ?In/Out Bed: Needs assistance ?Is this a change from baseline?: Pre-admission baseline ?Walks in Home: Needs assistance ?Is this a change from baseline?: Pre-admission baseline ?Does the patient have difficulty walking or climbing stairs?: Yes ?Weakness of Legs: Both ?Weakness of Arms/Hands: Both ? ?Permission Sought/Granted ?  ?  ?   ?   ?   ?   ? ?Emotional Assessment ?  ?  ?  ?  ?  ?  ? ?Admission diagnosis:  Weakness [R53.1] ?Generalized weakness [R53.1] ?Gastrointestinal hemorrhage, unspecified gastrointestinal hemorrhage type [K92.2] ?Patient Active Problem List  ? Diagnosis Date Noted  ? Generalized weakness 07/14/2021  ? Anemia 07/14/2021  ? Hypokalemia 07/14/2021  ? GI bleed 07/14/2021  ? 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 Heart  failure with reduced ejection fraction ([place]) 11/08/2019  ? History of back surgery 11/06/2019  ? History of MI (myocardial infarction) 11/06/2019  ? Goals of care, counseling/discussion 01/15/2017  ? Prostate cancer metastatic to bone ([place]) 01/14/2017  ? Hypercalcemia 12/11/2016  ? PAF (paroxysmal atrial fibrillation) ([place]) 12/11/2016  ? Fusion of spine of lumbosacral region 12/10/2016  ? Lumbar stenosis 03/17/2013  ? Incomplete emptying  of bladder 03/11/2013  ? Lumbar radiculopathy, acute 03/02/2013  ? Hypertension 07/04/2011  ? History of prostate cancer 07/02/2011  ? Hyperlipidemia, unspecified 07/02/2011  ? ST elevation MI (STEMI) ([place]) 04/01/2006  ? ?PCP:  Idelle Crouch, MD ?Pharmacy:   ?[facility] ([facility] [place] ?[address] ([place]) [place] [address] ?Phone: [phone] Fax: [phone] ? ?[facility] ?[address] ?[place] [address] ?Phone: [phone] Fax: [phone] ? ? ? ? ?Social Determinants of Health (SDOH) Interventions ?  ? ?Readmission Risk Interventions ?   ? View : No data to display.  ?  ?  ?  ? ? ? ?

## 2021-07-14 NOTE — [place] FL2 (Signed)
?[place] MEDICAID FL2 LEVEL OF CARE SCREENING TOOL  ?  ? ?IDENTIFICATION  ?Patient Name: ?Carlos Perkins Birthdate: March 07, 1935 Sex: male Admission Date (Current Location): ?07/13/2021  ?[place] and [place] Number: ? [place] and Address:  ?[place] [address], [place] ?     Provider Number: ?[id number]  ?Attending Physician Name and Address:  ?Barton Dubois, MD ? Relative Name and Phone Number:  ?  ?   ?Current Level of Care: ?Hospital Recommended Level of Care: ?[place] Prior Approval Number: ?  ? ?Date Approved/Denied: ?  PASRR Number: ?  ? ?Discharge Plan: ?SNF ?  ? ?Current Diagnoses: ?Patient Active Problem List  ? Diagnosis Date Noted  ? Generalized weakness 07/14/2021  ? Anemia 07/14/2021  ? Hypokalemia 07/14/2021  ? GI bleed 07/14/2021  ? Pathologic compression fracture of spine ([place]) 07/20/2020  ? Heart failure with reduced ejection fraction ([place]) 11/08/2019  ? History of back surgery 11/06/2019  ? History of MI (myocardial infarction) 11/06/2019  ? Goals of care, counseling/discussion 01/15/2017  ? Prostate cancer metastatic to bone ([place]) 01/14/2017  ? Hypercalcemia 12/11/2016  ? PAF (paroxysmal atrial fibrillation) ([place]) 12/11/2016  ? Fusion of spine of lumbosacral region 12/10/2016  ? Lumbar stenosis 03/17/2013  ? Incomplete emptying of bladder 03/11/2013  ? Lumbar radiculopathy, acute 03/02/2013  ? Hypertension 07/04/2011  ? History of prostate cancer 07/02/2011  ? Hyperlipidemia, unspecified 07/02/2011  ? ST elevation MI (STEMI) ([place]) 04/01/2006  ? ? ?Orientation RESPIRATION BLADDER Height & Weight   ?  ?Self, Time, Situation, Place ? Normal Continent Weight: 190 lb (86.2 kg) ?Height:  '5\' 10"'$  (177.8 cm)  ?BEHAVIORAL SYMPTOMS/MOOD NEUROLOGICAL BOWEL NUTRITION STATUS  ?    Continent Diet (See DC summary)  ?AMBULATORY STATUS COMMUNICATION OF NEEDS Skin   ?Extensive Assist Verbally Normal ?  ?  ?  ?    ?     ?     ? ? ?Personal Care  Assistance Level of Assistance  ?Bathing, Feeding, Dressing Bathing Assistance: Limited assistance ?Feeding assistance: Independent ?Dressing Assistance: Limited assistance ?   ? ?Functional Limitations Info  ?Sight, Hearing, Speech Sight Info: Adequate ?Hearing Info: Impaired ?Speech Info: Adequate  ? ? ?SPECIAL CARE FACTORS FREQUENCY  ?PT (By licensed PT), OT (By licensed OT)   ?  ?PT Frequency: 5 times weekly ?OT Frequency: 5 times weekly ?  ?  ?  ?   ? ? ?Contractures Contractures Info: Not present  ? ? ?Additional Factors Info  ?Code Status, Allergies Code Status Info: DNR ?Allergies Info: NKA ?  ?  ?  ?   ? ?Current Medications (07/14/2021):  This is the current hospital active medication list ?Current Facility-Administered Medications  ?Medication Dose Route Frequency Provider Last Rate Last Admin  ? 0.9 %  sodium chloride infusion   Intravenous Continuous Hurshel Keys K, DO 10 mL/hr at 07/14/21 1144 [place] from Bag at 07/14/21 1144  ? acetaminophen (TYLENOL) tablet 650 mg  650 mg Oral Q6H PRN Zierle-Ghosh, Asia B, DO      ? Or  ? acetaminophen (TYLENOL) suppository 650 mg  650 mg Rectal Q6H PRN Zierle-Ghosh, Asia B, DO      ? atorvastatin (LIPITOR) tablet 10 mg  10 mg Oral Daily Zierle-Ghosh, Asia B, DO   10 mg at 07/14/21 8182  ? carvedilol (COREG) tablet 25 mg  25 mg Oral Daily Zierle-Ghosh, Asia B, DO   25 mg at 07/14/21 9937  ? [  START ON 07/16/2021] cinacalcet (SENSIPAR) tablet 30 mg  30 mg Oral Once per day on Mon Wed Fri Zierle-Ghosh, [place] B, DO      ? furosemide (LASIX) tablet 20 mg  20 mg Oral Daily Zierle-Ghosh, Asia B, DO   20 mg at 07/14/21 0254  ? lisinopril (ZESTRIL) tablet 40 mg  40 mg Oral Daily Zierle-Ghosh, Asia B, DO   40 mg at 07/14/21 2706  ? morphine (PF) 2 MG/ML injection 2 mg  2 mg Intravenous Q2H PRN Zierle-Ghosh, Asia B, DO      ? ondansetron (ZOFRAN) tablet 4 mg  4 mg Oral Q6H PRN Zierle-Ghosh, Asia B, DO      ? Or  ? ondansetron (ZOFRAN) injection 4 mg  4 mg Intravenous Q6H PRN  Zierle-Ghosh, Asia B, DO      ? oxyCODONE (Oxy IR/ROXICODONE) immediate release tablet 5 mg  5 mg Oral Q4H PRN Zierle-Ghosh, Asia B, DO      ? pantoprazole (PROTONIX) EC tablet 40 mg  40 mg Oral BID Barton Dubois, MD   40 mg at 07/14/21 2376  ? polyethylene glycol-electrolytes (NuLYTELY) solution 4,000 mL  4,000 mL Oral Once Eloise Harman, DO      ? [START ON 07/15/2021] predniSONE (DELTASONE) tablet 5 mg  5 mg Oral Q breakfast Zierle-Ghosh, Asia B, DO      ? spironolactone (ALDACTONE) tablet 25 mg  25 mg Oral Daily Zierle-Ghosh, Asia B, DO   25 mg at 07/14/21 2831  ? ? ? ?Discharge Medications: ?Please see discharge summary for a list of discharge medications. ? ?Relevant Imaging Results: ? ?Relevant Lab Results: ? ? ?Additional Information ?SSN: [id number] ? ?Iona Beard, LCSWA ? ? ? ? ?

## 2021-07-14 NOTE — Assessment & Plan Note (Addendum)
-  Hemoglobin baseline around 11  ?-FOBT positive ?-Appreciate assistance and recommendation by GI  ?-4/18 colonoscopy-- 6 polyps in transv & asc colon, 3 polyps in sigmoid and desc colon; nonbleeding internal hemorrhoids ?-4/18 EGD--nonbleeding duodenal diverticulum; +hiatus hernia ?-restart apixaban when ok with GI ?-Hgb has remained stable during hospitalization ?4/22--capsule study--Single small normal bleeding AV malformation in jejunum. ?Study is incomplete as capsule did not reach cecum ?Discussed with Dr. Trula Ore apixaban 4/24 ?

## 2021-07-15 DIAGNOSIS — K922 Gastrointestinal hemorrhage, unspecified: Secondary | ICD-10-CM | POA: Diagnosis not present

## 2021-07-15 DIAGNOSIS — D125 Benign neoplasm of sigmoid colon: Secondary | ICD-10-CM | POA: Diagnosis present

## 2021-07-15 DIAGNOSIS — Z789 Other specified health status: Secondary | ICD-10-CM | POA: Diagnosis not present

## 2021-07-15 DIAGNOSIS — I5020 Unspecified systolic (congestive) heart failure: Secondary | ICD-10-CM

## 2021-07-15 DIAGNOSIS — R627 Adult failure to thrive: Secondary | ICD-10-CM | POA: Diagnosis present

## 2021-07-15 DIAGNOSIS — I480 Paroxysmal atrial fibrillation: Secondary | ICD-10-CM

## 2021-07-15 DIAGNOSIS — E785 Hyperlipidemia, unspecified: Secondary | ICD-10-CM | POA: Diagnosis present

## 2021-07-15 DIAGNOSIS — Z9181 History of falling: Secondary | ICD-10-CM | POA: Diagnosis not present

## 2021-07-15 DIAGNOSIS — R531 Weakness: Secondary | ICD-10-CM

## 2021-07-15 DIAGNOSIS — K5710 Diverticulosis of small intestine without perforation or abscess without bleeding: Secondary | ICD-10-CM | POA: Diagnosis not present

## 2021-07-15 DIAGNOSIS — D509 Iron deficiency anemia, unspecified: Secondary | ICD-10-CM | POA: Diagnosis present

## 2021-07-15 DIAGNOSIS — K5521 Angiodysplasia of colon with hemorrhage: Secondary | ICD-10-CM | POA: Diagnosis present

## 2021-07-15 DIAGNOSIS — C61 Malignant neoplasm of prostate: Secondary | ICD-10-CM | POA: Diagnosis present

## 2021-07-15 DIAGNOSIS — I10 Essential (primary) hypertension: Secondary | ICD-10-CM

## 2021-07-15 DIAGNOSIS — K224 Dyskinesia of esophagus: Secondary | ICD-10-CM | POA: Diagnosis present

## 2021-07-15 DIAGNOSIS — G9341 Metabolic encephalopathy: Secondary | ICD-10-CM | POA: Diagnosis not present

## 2021-07-15 DIAGNOSIS — Z66 Do not resuscitate: Secondary | ICD-10-CM | POA: Diagnosis present

## 2021-07-15 DIAGNOSIS — C7951 Secondary malignant neoplasm of bone: Secondary | ICD-10-CM

## 2021-07-15 DIAGNOSIS — Z7189 Other specified counseling: Secondary | ICD-10-CM | POA: Diagnosis not present

## 2021-07-15 DIAGNOSIS — E782 Mixed hyperlipidemia: Secondary | ICD-10-CM | POA: Diagnosis not present

## 2021-07-15 DIAGNOSIS — D519 Vitamin B12 deficiency anemia, unspecified: Secondary | ICD-10-CM | POA: Diagnosis not present

## 2021-07-15 DIAGNOSIS — K635 Polyp of colon: Secondary | ICD-10-CM | POA: Diagnosis not present

## 2021-07-15 DIAGNOSIS — D649 Anemia, unspecified: Secondary | ICD-10-CM | POA: Diagnosis not present

## 2021-07-15 DIAGNOSIS — I5022 Chronic systolic (congestive) heart failure: Secondary | ICD-10-CM | POA: Diagnosis present

## 2021-07-15 DIAGNOSIS — K5750 Diverticulosis of both small and large intestine without perforation or abscess without bleeding: Secondary | ICD-10-CM | POA: Diagnosis present

## 2021-07-15 DIAGNOSIS — K648 Other hemorrhoids: Secondary | ICD-10-CM | POA: Diagnosis present

## 2021-07-15 DIAGNOSIS — I110 Hypertensive heart disease with heart failure: Secondary | ICD-10-CM | POA: Diagnosis present

## 2021-07-15 DIAGNOSIS — E538 Deficiency of other specified B group vitamins: Secondary | ICD-10-CM | POA: Diagnosis present

## 2021-07-15 DIAGNOSIS — R7881 Bacteremia: Secondary | ICD-10-CM | POA: Diagnosis not present

## 2021-07-15 DIAGNOSIS — E876 Hypokalemia: Secondary | ICD-10-CM | POA: Diagnosis present

## 2021-07-15 DIAGNOSIS — Z515 Encounter for palliative care: Secondary | ICD-10-CM | POA: Diagnosis not present

## 2021-07-15 DIAGNOSIS — K449 Diaphragmatic hernia without obstruction or gangrene: Secondary | ICD-10-CM | POA: Diagnosis present

## 2021-07-15 DIAGNOSIS — J9811 Atelectasis: Secondary | ICD-10-CM | POA: Diagnosis not present

## 2021-07-15 DIAGNOSIS — I252 Old myocardial infarction: Secondary | ICD-10-CM | POA: Diagnosis not present

## 2021-07-15 DIAGNOSIS — A411 Sepsis due to other specified staphylococcus: Secondary | ICD-10-CM | POA: Diagnosis not present

## 2021-07-15 LAB — COMPREHENSIVE METABOLIC PANEL
ALT: 14 U/L (ref 0–44)
AST: 18 U/L (ref 15–41)
Albumin: 2.4 g/dL — ABNORMAL LOW (ref 3.5–5.0)
Alkaline Phosphatase: 50 U/L (ref 38–126)
Anion gap: 6 (ref 5–15)
BUN: 15 mg/dL (ref 8–23)
CO2: 23 mmol/L (ref 22–32)
Calcium: 9.9 mg/dL (ref 8.9–10.3)
Chloride: 105 mmol/L (ref 98–111)
Creatinine, Ser: 1 mg/dL (ref 0.61–1.24)
GFR, Estimated: >60 mL/min (ref >60)
Glucose, Bld: 99 mg/dL (ref 70–99)
Potassium: 4.3 mmol/L (ref 3.5–5.1)
Sodium: 134 mmol/L — ABNORMAL LOW (ref 135–145)
Total Bilirubin: 0.9 mg/dL (ref 0.3–1.2)
Total Protein: 5.7 g/dL — ABNORMAL LOW (ref 6.5–8.1)

## 2021-07-15 LAB — BLOOD GAS, ARTERIAL
Acid-base deficit: 0.2 mmol/L (ref 0.0–2.0)
Bicarbonate: 21.8 mmol/L (ref 20.0–28.0)
Drawn by: 27016
FIO2: 21 %
O2 Saturation: 98.1 %
Patient temperature: 37.6
pCO2 arterial: 29 mmHg — ABNORMAL LOW (ref 32–48)
pH, Arterial: 7.49 — ABNORMAL HIGH (ref 7.35–7.45)
pO2, Arterial: 81 mmHg — ABNORMAL LOW (ref 83–108)

## 2021-07-15 LAB — CBC WITH DIFFERENTIAL/PLATELET
Abs Immature Granulocytes: 0.03 10*3/uL (ref 0.00–0.07)
Basophils Absolute: 0 10*3/uL (ref 0.0–0.1)
Basophils Relative: 0 %
Eosinophils Absolute: 0.2 10*3/uL (ref 0.0–0.5)
Eosinophils Relative: 3 %
HCT: 28.1 % — ABNORMAL LOW (ref 39.0–52.0)
Hemoglobin: 9 g/dL — ABNORMAL LOW (ref 13.0–17.0)
Immature Granulocytes: 0 %
Lymphocytes Relative: 17 %
Lymphs Abs: 1.2 10*3/uL (ref 0.7–4.0)
MCH: 28.1 pg (ref 26.0–34.0)
MCHC: 32 g/dL (ref 30.0–36.0)
MCV: 87.8 fL (ref 80.0–100.0)
Monocytes Absolute: 0.7 10*3/uL (ref 0.1–1.0)
Monocytes Relative: 10 %
Neutro Abs: 4.8 10*3/uL (ref 1.7–7.7)
Neutrophils Relative %: 70 %
Platelets: 358 10*3/uL (ref 150–400)
RBC: 3.2 MIL/uL — ABNORMAL LOW (ref 4.22–5.81)
RDW: 14.2 % (ref 11.5–15.5)
WBC: 6.9 10*3/uL (ref 4.0–10.5)
nRBC: 0 % (ref 0.0–0.2)

## 2021-07-15 LAB — MAGNESIUM: Magnesium: 1.8 mg/dL (ref 1.7–2.4)

## 2021-07-15 LAB — VITAMIN D 25 HYDROXY (VIT D DEFICIENCY, FRACTURES): Vit D, 25-Hydroxy: 70.89 ng/mL (ref 30–100)

## 2021-07-15 MED ORDER — PEG 3350-KCL-NA BICARB-NACL 420 G PO SOLR
4000.0000 mL | Freq: Once | ORAL | Status: AC
  Administered 2021-07-15: 4000 mL via ORAL

## 2021-07-15 MED ORDER — ABIRATERONE ACETATE 250 MG PO TABS
1000.0000 mg | ORAL_TABLET | Freq: Every day | ORAL | Status: DC
  Administered 2021-07-16 – 2021-07-17 (×2): 1000 mg via ORAL
  Filled 2021-07-15: qty 4

## 2021-07-15 MED ORDER — SODIUM CHLORIDE 0.9 % IV SOLN: INTRAVENOUS | Status: DC

## 2021-07-15 MED ORDER — CYANOCOBALAMIN 1000 MCG/ML IJ SOLN
1000.0000 ug | Freq: Every day | INTRAMUSCULAR | Status: AC
  Administered 2021-07-15 – 2021-07-21 (×7): 1000 ug via INTRAMUSCULAR
  Filled 2021-07-15 (×7): qty 1

## 2021-07-15 NOTE — Assessment & Plan Note (Addendum)
-  Continue outpatient follow-up with urology/oncology ?-Currently taking Zytiga, which is on hold at this time>>restart after d/c ? ?

## 2021-07-15 NOTE — Progress Notes (Signed)
?Progress Note ? ? ?Patient: Carlos Perkins EHU:[id number] DOB: [birth year] DOA: 07/13/2021     0 ?DOS: the patient was seen and examined on 07/15/2021 ?  ?Brief hospital course: ?As per H&P written by Dr. Clearence Ped on 07/14/21 ?Duey Teo Perkins is a 87 y.o. male with medical history significant of hyperlipidemia, hypertension, myocardial infarction, CHF, paroxysmal atrial fibrillation, and more presents the ED with a chief complaint of generalized weakness.  Patient's family had reported to the ED provider that patient has had a general decline over a couple of months.  He lost his closest living relative 2 months ago, and has been declining since then.  Patient reports that he had a fall 2 weeks ago on Monday.  Since then he thinks he has been getting worse.  He reports he cannot stand up mostly due to weakness, but also pain.  He has sharp pains in his toes when he tries to stand.  He reports the weakness and the pains are equal in the bilateral lower extremities.  He also has sharp pains in his wrist.  His grip strength is significantly decreased because of this.  He reports the symptoms have been present for 2 weeks.  He denies any fatigue.  He reports he is short of breath but its not exertional.  He denies chest pain, hematochezia, melena, fever.  He denies asymmetric weakness, numbness, or hypersensitivity.  He has no other complaints at this time. ?  ?Patient does not smoke, does not drink alcohol, does not use illicit drugs.  He is vaccinated for COVID.  Patient is DNR. ? ?Assessment and Plan: ?* Generalized weakness ?- Most likely secondary to deconditioning; but multifactorial. ?-TSH within normal limits ?-B12 significantly low and will be repleted. ?-Patient also with decreased oral intake for 3 days or so prior to admission given also concern for dehydration and electrolyte abnormality. ?-Gentle fluid resuscitation has been provided and electrolytes have been repleted. ?-Will continue  encouraging him to have oral intake. ?-No signs of acute infection appreciated ?-CT head without acute intracranial normalities. ?-Ammonia is less than 10 ?-Patient is slightly anemic with a hemoglobin of 9.8>>9.0 and positive fecal occult blood test. ?-Case discussed with gastroenterology service who has recommended endoscopic evaluation.  Family in agreement. ?-Baseline hemoglobin is 11 ? ?GI bleed ?-Hemoglobin baseline around 11 range. ?-FOBT positive ?-Denies hematochezia or melena ?-Appreciate assistance and recommendation by GI therapy ?-Pending proper bowel prep plan is for endoscopic evaluation (EGD and colonoscopy). ? ?Hypokalemia ?-Potassium 3.4 at time of admission ?-Repleted and is stable currently ?-Continue to follow electrolytes trend. ?-Presentation of low potassium in the setting of decreased oral intake and continued use of diuretics. ? ? ?Anemia ?-Hemoglobin baseline around 11  ?-Today 9.0 ?-FOBT positive ?-Continue to follow hemoglobin trend; follow GI service recommendations. ? ?PAF (paroxysmal atrial fibrillation) ([place]) ?-Continue beta-blocker  ?-Currently in A-fib, rate controlled ?-Continue telemetry monitoring ?-Holding anticoagulation in the setting of positive fecal occult blood test and need for endoscopic evaluation. ? ?Heart failure with reduced ejection fraction ([place]) ?-Continue Lasix, Coreg, ACE and spironolactone as an outpatient. ?-Peripheral edema present, but not in acute exacerbation ?-Patient swelling has improved ?-Will hold spironolactone in anticipation for bowel prep for endoscopic evaluation. ?-Continue the rest of his medication ?-Continue to follow daily weights and strict I's and O's. ? ? ?Prostate cancer metastatic to bone [facility]) ?-Continue outpatient follow-up with urology/oncology ?-Currently taking Zytiga, which is on hold at this time. ? ? ?Hypertension ?-Continue Coreg and ACE ?-Continue to  follow vital signs. ? ?Hyperlipidemia, unspecified ?-Continue  statin ? ? ?Subjective:  ?Somnolent, with increased lethargy and in intermittent disorientation.  Per nursing report yesterday prior to night shift change patient complaining of back pain and received oxycodone.  Since then mentation has been more abnormal.  No overt bleeding reported. ? ?Physical Exam: ?Vitals:  ? 07/14/21 2040 07/15/21 0557 07/15/21 0849 07/15/21 1431  ?BP: 131/68 140/61 114/71 (!) 108/56  ?Pulse: 95 76 93 (!) 102  ?Resp: '20 19 18 20  '$ ?Temp: 98.6 ?F (37 ?C) 98 ?F (36.7 ?C) 99.7 ?F (37.6 ?C) 98.1 ?F (36.7 ?C)  ?TempSrc: Oral  Oral Oral  ?SpO2: 93% 96% 93% 95%  ?Weight:      ?Height:      ? ?General exam: Alert, awake, oriented x 2, intermittently disoriented; more lethargic and somnolent today.  No chest pain, no nausea, no vomiting.  Complaining intermittently of back pain. ?Respiratory system: Clear to auscultation. Respiratory effort normal.  Good saturation on room air; no using accessory muscles. ?Cardiovascular system:Rate controlled; no rubs, no gallops, no JVD. ?Gastrointestinal system: Abdomen is nondistended, soft and nontender. No organomegaly or masses felt. Normal bowel sounds heard. ?Central nervous system: Alert and oriented. No focal neurological deficits. ?Extremities: No cyanosis or clubbing; trace edema appreciated bilaterally. ?Skin: No petechiae. ?Psychiatry: Judgement and insight appear impaired in the setting of acute encephalopathy currently. ? ?Data Reviewed: ?B12 130 ?Magnesium 1.8 ?Comprehensive metabolic panel demonstrating a sodium of 134, potassium 4.3, creatinine 1.0 and BUN 15 ?Stable ABG with an arterial pH of 7.49, bicarb 29 and PO2 81 ? ?Family Communication: Daughters at bedside. ? ?Disposition: ?Status is: Inpatient ?Remains inpatient appropriate because: Still in need of evaluation for GI bleed and currently experiencing acute encephalopathy. ? ? Planned Discharge Destination: Home with home health services. ? ?Author: ?Barton Dubois, MD ?07/15/2021 6:51  PM ? ?For on call review [email or web address].  ?

## 2021-07-15 NOTE — Progress Notes (Signed)
Multiple family members at bedside with concerns for patients health and mental status. Patient lethargic and has some confusion this morning, MD [facility] aware. [name] due to procedures being rescheduled per MD Abbey Chatters. [name] per MD Dyann Kief.  ?

## 2021-07-15 NOTE — Progress Notes (Signed)
Subjective: ?Patient more somnolent on exam today.  Does wake up and answer questions.  Multiple family members at bedside.  Hemoglobin stable.  No overt GI bleeding.  Patient's family does state that he has had rectal bleeding for months now though this has never been addressed. ? ?Objective: ?Vital signs in last 24 hours: ?Temp:  [98 ?F (36.7 ?C)-99.7 ?F (37.6 ?C)] 98.1 ?F (36.7 ?C) (04/16 1431) ?Pulse Rate:  [76-102] 102 (04/16 1431) ?Resp:  [18-20] 20 (04/16 1431) ?BP: (108-140)/(56-71) 108/56 (04/16 1431) ?SpO2:  [93 %-96 %] 95 % (04/16 1431) ?Last BM Date : 07/13/21 ?General:   Somnolent but does wake up to answer questions. ?Head:  Normocephalic and atraumatic. ?Eyes:  No icterus, sclera clear. Conjuctiva pink.  ?Abdomen:  Bowel sounds present, soft, non-tender, non-distended. No HSM or hernias noted. No rebound or guarding. No masses appreciated  ?Msk:  Symmetrical without gross deformities. Normal posture. ?Extremities:  Without clubbing or edema. ?Skin:  Warm and dry, intact without significant lesions.  ?Cervical Nodes:  No significant cervical adenopathy. ? ?Intake/Output from previous day: ?04/15 0701 - 04/16 0700 ?In: 674.3 [P.O.:370; I.V.:304.3] ?Out: 700 [Urine:700] ?Intake/Output this shift: ?No intake/output data recorded. ? ?Lab Results: ?Recent Labs  ?  07/13/21 ?2143 07/14/21 ?0708 07/15/21 ?0500  ?WBC 7.6 6.7 6.9  ?HGB 9.8* 9.6* 9.0*  ?HCT 29.7* 29.3* 28.1*  ?PLT 355 326 358  ? ?BMET ?Recent Labs  ?  07/13/21 ?2143 07/15/21 ?0500  ?NA 132* 134*  ?K 3.4* 4.3  ?CL 102 105  ?CO2 23 23  ?GLUCOSE 123* 99  ?BUN 15 15  ?CREATININE 1.05 1.00  ?CALCIUM 10.0 9.9  ? ?LFT ?Recent Labs  ?  07/15/21 ?0500  ?PROT 5.7*  ?ALBUMIN 2.4*  ?AST 18  ?ALT 14  ?ALKPHOS 50  ?BILITOT 0.9  ? ?PT/INR ?No results for input(s): LABPROT, INR in the last 72 hours. ?Hepatitis Panel ?No results for input(s): HEPBSAG, HCVAB, HEPAIGM, HEPBIGM in the last 72 hours. ? ? ?Studies/Results: ?DG Wrist Complete Left ? ?Result Date:  07/14/2021 ?CLINICAL DATA:  Generalized weakness since being discharged from [facility] 1 week ago. Also complains of bilateral wrist swelling and soreness. EXAM: LEFT WRIST - COMPLETE 3+ VIEW COMPARISON:  None. FINDINGS: There is no evidence of an acute fracture or dislocation. A chronic fracture deformity is seen involving the distal shaft of the left radius. An additional chronic fracture of the left ulnar styloid is noted. Marked severity degenerative changes are noted throughout the left wrist with a chronic deformity is seen involving the left scaphoid bone. There is mild diffuse soft tissue swelling. IMPRESSION: 1. Marked severity degenerative changes without an acute fracture or dislocation. 2. Chronic fractures of the distal left radius and left ulnar styloid. Electronically Signed   By: Virgina Norfolk M.D.   On: 07/14/2021 00:21  ? ?DG Wrist Complete Right ? ?Result Date: 07/14/2021 ?CLINICAL DATA:  Generalized weakness since being discharged from [facility] 1 week ago with complaints of bilateral wrist swelling and soreness. EXAM: RIGHT WRIST - COMPLETE 3+ VIEW COMPARISON:  None. FINDINGS: There is no evidence of fracture or dislocation. Mild degenerative changes are seen along the radiocarpal joint and along the carpal metacarpal articulation of the right thumb. Mild diffuse soft tissue swelling is seen. IMPRESSION: No acute osseous abnormality. Electronically Signed   By: Virgina Norfolk M.D.   On: 07/14/2021 00:23  ? ?CT Head Wo Contrast ? ?Result Date: 07/14/2021 ?CLINICAL DATA:  Generalized weakness since being discharged from  [facility] 1 week ago. EXAM: CT HEAD WITHOUT CONTRAST TECHNIQUE: Contiguous axial images were obtained from the base of the skull through the vertex without intravenous contrast. RADIATION DOSE REDUCTION: This exam was performed according to the departmental dose-optimization program which includes automated exposure control, adjustment of the mA and/or kV according  to patient size and/or use of iterative reconstruction technique. COMPARISON:  January 07, 2020 FINDINGS: Brain: No evidence of acute infarction, hemorrhage, hydrocephalus, extra-axial collection or mass lesion/mass effect. Vascular: No hyperdense vessel or unexpected calcification. Skull: Normal. Negative for fracture or focal lesion. Sinuses/Orbits: No acute finding. Other: None. IMPRESSION: 1. No acute intracranial process. 2. Generalized cerebral atrophy with chronic white matter small vessel ischemic changes. Electronically Signed   By: Virgina Norfolk M.D.   On: 07/14/2021 01:51  ? ?DG Chest Portable 1 View ? ?Result Date: 07/14/2021 ?CLINICAL DATA:  Generalized weakness since being discharged from [facility] 1 week ago. EXAM: PORTABLE CHEST 1 VIEW COMPARISON:  February 14, 2021 FINDINGS: The heart size and mediastinal contours are within normal limits. There is marked severity calcification of the aortic arch. Mild atelectasis is seen within the bilateral lung bases. Both lungs are otherwise clear. The visualized skeletal structures are unremarkable. IMPRESSION: No acute cardiopulmonary disease. Electronically Signed   By: Virgina Norfolk M.D.   On: 07/14/2021 00:19   ? ?Impression: ?*Symptomatic anemia ?*Heme positive stool-suspected GI bleed ?*Generalized weakness/fatigue due to above ?*Sigmoid soft tissue density/abnormal CT scan ?  ?Plan: ?Etiology of patient's symptomatic anemia unclear.  GI bleeding certainly on differential. ?  ?We will proceed with EGD to further evaluate for upper source of bleeding including gastritis, duodenitis, peptic ulcer disease, AVMs, Dieulafoy lesions, polyps, malignancy, or other. ?  ?At the same time we will perform colonoscopy to evaluate for potential lower sources of GI bleeding as well. ?  ?Plan for procedures tomorrow if able to tolerate prep tonight ? ?Continue on IV Protonix twice daily. ? ?Continue monitor H&H and transfuse for less than 7. ? ?Agree with  holding Eliquis.   ?  ? ? ?Elon Alas. Abbey Chatters, D.O. ?Gastroenterology and Hepatology ?[facility] ? ? LOS: 0 days  ? ? 07/15/2021, 2:50 PM ? ? ?

## 2021-07-15 NOTE — Progress Notes (Signed)
Family member requesting chicken broth.  No broth on unit.  Went to another unit to provide chicken broth.   ? ?When I entered the room with chicken broth, patient was sleeping.  Easily aroused to voice, but without stimulation would fall back asleep. Family member stated that he was requesting chicken broth, but that the patient was refusing to drink his bowel prep. I explained to family members that it was imperative that patient has liquid stool that is as clear as possible to allow success evaluation tomorrow.  If he was not clean/clear enough that procedure would not be completed and then he might have to redo the bowel prep tomorrow night.     ? ? ?Additionally, while I was outside of the room and an NT was in the room, the family members stated that they were unconcerned with him completing the bowel prep and that they were not there to help him complete this successfully, but instead were there just to make sure he does not get out of bed.  ? ? ?There were 2 male family members in room after visiting hours, which was not adherent to [employer]. I reminded them that per the policy he was only allowed 1 visitor over night because he was not on comfort care/hospice and he was not unstable with imminent death. Family member requesting to speak to my Contractor.  Agricultural consultant, Radio broadcast assistant, notified.  Steffanie Dunn spoke with family members and stated that they wanted have special permission to stay over night. AC, Heather, notified.  ? ?AC spoke with family. Family states that they were told earlier by someone (unsure of name) that they could both stay overnight.  One states she is from out of town and does not have a vehicle here.  The other states she also does not have a vehicle here.  They do not believe they can find a ride tonight.  Since they would be unable to safely get home tonight, 2 family members can stay for tonight only. ?  ? ?

## 2021-07-16 ENCOUNTER — Encounter (HOSPITAL_COMMUNITY)
Admission: EM | Disposition: A | Discharge status: Home Health | Payer: Self-pay | Source: Home / Self Care | Attending: Internal Medicine

## 2021-07-16 DIAGNOSIS — D649 Anemia, unspecified: Secondary | ICD-10-CM | POA: Diagnosis not present

## 2021-07-16 DIAGNOSIS — R531 Weakness: Secondary | ICD-10-CM | POA: Diagnosis not present

## 2021-07-16 DIAGNOSIS — K922 Gastrointestinal hemorrhage, unspecified: Secondary | ICD-10-CM | POA: Diagnosis not present

## 2021-07-16 LAB — BASIC METABOLIC PANEL
Anion gap: 8 (ref 5–15)
BUN: 19 mg/dL (ref 8–23)
CO2: 21 mmol/L — ABNORMAL LOW (ref 22–32)
Calcium: 9.9 mg/dL (ref 8.9–10.3)
Chloride: 104 mmol/L (ref 98–111)
Creatinine, Ser: 1.07 mg/dL (ref 0.61–1.24)
GFR, Estimated: >60 mL/min (ref >60)
Glucose, Bld: 117 mg/dL — ABNORMAL HIGH (ref 70–99)
Potassium: 4.2 mmol/L (ref 3.5–5.1)
Sodium: 133 mmol/L — ABNORMAL LOW (ref 135–145)

## 2021-07-16 LAB — CBC
HCT: 27.7 % — ABNORMAL LOW (ref 39.0–52.0)
Hemoglobin: 8.7 g/dL — ABNORMAL LOW (ref 13.0–17.0)
MCH: 28 pg (ref 26.0–34.0)
MCHC: 31.4 g/dL (ref 30.0–36.0)
MCV: 89.1 fL (ref 80.0–100.0)
Platelets: 333 10*3/uL (ref 150–400)
RBC: 3.11 MIL/uL — ABNORMAL LOW (ref 4.22–5.81)
RDW: 14.5 % (ref 11.5–15.5)
WBC: 8 10*3/uL (ref 4.0–10.5)
nRBC: 0 % (ref 0.0–0.2)

## 2021-07-16 LAB — GLUCOSE, CAPILLARY: Glucose-Capillary: 122 mg/dL — ABNORMAL HIGH (ref 70–99)

## 2021-07-16 LAB — CALCIUM, IONIZED: Calcium, Ionized, Serum: 6 mg/dL — ABNORMAL HIGH (ref 4.5–5.6)

## 2021-07-16 SURGERY — ESOPHAGOGASTRODUODENOSCOPY (EGD) WITH PROPOFOL
Anesthesia: Monitor Anesthesia Care

## 2021-07-16 MED ORDER — POLYETHYLENE GLYCOL 3350 17 G PO PACK
51.0000 g | PACK | Freq: Once | ORAL | Status: AC
  Administered 2021-07-16: 51 g via ORAL
  Filled 2021-07-16: qty 3

## 2021-07-16 NOTE — Progress Notes (Addendum)
Patient seen in preparation for colonoscopy/EGD. Daughters at bedside. Patient desiring to go home. Alert. Unable to tell me the year or city.  ? ?Bowel prep completed except for approximately 1/4. Per NT, tap water enema completed this morning with clear output. ? ?I discussed risks and benefits with patient and family members. Daughters agreeable to pursue but concerned about anesthesia. They report that he went into episode of afib following back surgery 6-7 years ago, so procedures made them concerned. However, they do want to pursue this as they feel he will not be agreeable to this at a later date. ? ?Colonoscopy/EGD planned today by Dr. Abbey Chatters.  ?

## 2021-07-16 NOTE — Progress Notes (Signed)
?Progress Note ? ? ?Patient: Carlos Perkins IWL:[id number] DOB: [birth year] DOA: 07/13/2021     1 ?DOS: the patient was seen and examined on 07/16/2021 ?  ?Brief hospital course: ?As per H&P written by Dr. Clearence Ped on 07/14/21 ?Carlos Perkins is a 87 y.o. male with medical history significant of hyperlipidemia, hypertension, myocardial infarction, CHF, paroxysmal atrial fibrillation, and more presents the ED with a chief complaint of generalized weakness.  Patient's family had reported to the ED provider that patient has had a general decline over a couple of months.  He lost his closest living relative 2 months ago, and has been declining since then.  Patient reports that he had a fall 2 weeks ago on Monday.  Since then he thinks he has been getting worse.  He reports he cannot stand up mostly due to weakness, but also pain.  He has sharp pains in his toes when he tries to stand.  He reports the weakness and the pains are equal in the bilateral lower extremities.  He also has sharp pains in his wrist.  His grip strength is significantly decreased because of this.  He reports the symptoms have been present for 2 weeks.  He denies any fatigue.  He reports he is short of breath but its not exertional.  He denies chest pain, hematochezia, melena, fever.  He denies asymmetric weakness, numbness, or hypersensitivity.  He has no other complaints at this time. ?  ?Patient does not smoke, does not drink alcohol, does not use illicit drugs.  He is vaccinated for COVID.  Patient is DNR. ? ?Assessment and Plan: ?* Generalized weakness ?- Most likely secondary to deconditioning; but multifactorial. ?-TSH within normal limits ?-B12 significantly low and will be repleted. ?-Patient also with decreased oral intake for 3 days or so prior to admission given also concern for dehydration and electrolyte abnormality. ?-Gentle fluid resuscitation has been provided and electrolytes have been repleted. ?-Will continue  encouraging him to have oral intake. ?-No signs of acute infection appreciated ?-CT head without acute intracranial normalities. ?-Ammonia is less than 10 ?-Patient is slightly anemic with a hemoglobin of 9.8>>9.0 and positive fecal occult blood test. ?-Case discussed with gastroenterology service who has recommended endoscopic evaluation.  Family in agreement. ?-Baseline hemoglobin is 11 ? ?GI bleed ?-Hemoglobin baseline around 11 range. ?-FOBT positive ?-Denies hematochezia or melena ?-Appreciate assistance and recommendation by GI therapy ?-Pending (EGD and colonoscopy), hopefully tomorrow. ?-Continue holding anticoagulation. ? ?Hypokalemia ?-Potassium 3.4 at time of admission ?-Repleted and within normal limits currently. ?-Continue to follow electrolytes trend. ?-Presentation of low potassium in the setting of decreased oral intake and continued use of diuretics. ? ? ?Anemia ?-Hemoglobin baseline around 11  ?-Today 8.7 ?-FOBT positive ?-Continue to follow hemoglobin trend; follow GI service recommendations. ? ?PAF (paroxysmal atrial fibrillation) ([place]) ?-Continue beta-blocker  ?-Currently in A-fib, rate controlled ?-Continue telemetry monitoring ?-Holding anticoagulation in the setting of positive fecal occult blood test and need for endoscopic evaluation. ? ?Heart failure with reduced ejection fraction ([place]) ?-Continue Lasix, Coreg, ACE and spironolactone as an outpatient. ?-Peripheral edema present, but not in acute exacerbation ?-Patient swelling has improved ?-Will hold spironolactone in anticipation for bowel prep for endoscopic evaluation. ?-Continue the rest of his medication ?-Continue to follow daily weights and strict I's and O's. ? ? ?Prostate cancer metastatic to bone [facility]) ?-Continue outpatient follow-up with urology/oncology ?-Currently taking Zytiga, which is on hold at this time. ? ? ?Hypertension ?-Continue Coreg and ACE ?-Continue to follow vital  signs. ? ?Hyperlipidemia,  unspecified ?-Continue statin ? ? ?Subjective:  ?No overt bleeding appreciated; still somnolent but more oriented.  Denies chest pain, no nausea, no vomiting. ? ?Physical Exam: ?Vitals:  ? 07/15/21 1431 07/15/21 2238 07/16/21 0633 07/16/21 1328  ?BP: (!) 108/56 (!) 100/57 116/77 118/66  ?Pulse: (!) 102 81 93 80  ?Resp: '20 18 17 18  '$ ?Temp: 98.1 ?F (36.7 ?C) 98.3 ?F (36.8 ?C) 99.2 ?F (37.3 ?C) 98.5 ?F (36.9 ?C)  ?TempSrc: Oral  Oral Oral  ?SpO2: 95% 97% 94% 95%  ?Weight:   92.2 kg   ?Height:      ? ?General exam: Alert, awake, oriented x 2; still disoriented but with overall improved mentation.  No nausea, no vomiting, no chest pain, no shortness of breath.  Weak, tired and deconditioned. ?Respiratory system: Clear to auscultation. Respiratory effort normal.  Good saturation on room air. ?Cardiovascular system: Rate controlled, irregular rhythm; no rubs or gallops.  No JVD. ?Gastrointestinal system: Abdomen is nondistended, soft and nontender. No organomegaly or masses felt. Normal bowel sounds heard. ?Central nervous system: Alert and oriented. No focal neurological deficits. ?Extremities: No cyanosis or clubbing.  Trace edema appreciated bilaterally. ?Skin: No rashes, lesions or ulcers ?Psychiatry: Judgement and insight appear impaired in the setting of encephalopathy. ? ?Data Reviewed: ?B12 130 ?CBC demonstrating WBCs 8.0, hemoglobin 8.7, platelet count 333,000 ?Basic metabolic panel with a sodium of 133, potassium 4.2, bicarb 21, glucose 117, BUN 19 and creatinine 1.07. ? ?Family Communication: Daughters at bedside. ? ?Disposition: ?Status is: Inpatient ?Remains inpatient appropriate because: Still in need of evaluation for GI bleed and currently experiencing acute encephalopathy. ? ? Planned Discharge Destination: Home with home health services. ? ?Author: ?Barton Dubois, MD ?07/16/2021 6:31 PM ? ?For on call review [email or web address].  ?

## 2021-07-16 NOTE — Progress Notes (Signed)
Despite family, nurse, and NT encouragement, patient did not complete bowel prep.  He did complete most of it.  His last BM was liquid and brown in color.   ?

## 2021-07-16 NOTE — Progress Notes (Signed)
? ?Gastroenterology Progress Note  ? ?Primary Care Physician:  Idelle Crouch, MD ?Primary Gastroenterologist:  Dr. Abbey Chatters ? ?Patient ID: Carlos Perkins; [id number]; Aug 11, 1934  ? ? ?Subjective  ? ?Alert but confused. Wants to go home. Wants to eat. 2 daughters at bedside. Complete almost all prep except 1/4 of jug. Output clear. No overt GI bleeding.  ? ? ?Objective  ? ?Vital signs in last 24 hours ?Temp:  [98.1 ?F (36.7 ?C)-99.2 ?F (37.3 ?C)] 99.2 ?F (37.3 ?C) (04/17 4098) ?Pulse Rate:  [81-102] 93 (04/17 1191) ?Resp:  [17-20] 17 (04/17 4782) ?BP: (100-116)/(56-77) 116/77 (04/17 9562) ?SpO2:  [94 %-97 %] 94 % (04/17 1308) ?Weight:  [92.2 kg] 92.2 kg (04/17 6578) ?Last BM Date : 07/15/21 ? ?Physical Exam ?General:   Alert and oriented to person only. Anxious ?Head:  Normocephalic and atraumatic. ?Abdomen:  Bowel sounds present, soft, non-tender, non-distended.  ?Extremities:  With pedal edema  ? ? ?Intake/Output from previous day: ?04/16 0701 - 04/17 0700 ?In: -  ?Out: 500 [Urine:500] ?Intake/Output this shift: ?No intake/output data recorded. ? ?Lab Results ? ?Recent Labs  ?  07/14/21 ?0708 07/15/21 ?0500 07/16/21 ?4696  ?WBC 6.7 6.9 8.0  ?HGB 9.6* 9.0* 8.7*  ?HCT 29.3* 28.1* 27.7*  ?PLT 326 358 333  ? ?BMET ?Recent Labs  ?  07/13/21 ?2143 07/15/21 ?0500 07/16/21 ?2952  ?NA 132* 134* 133*  ?K 3.4* 4.3 4.2  ?CL 102 105 104  ?CO2 23 23 21*  ?GLUCOSE 123* 99 117*  ?BUN '15 15 19  '$ ?CREATININE 1.05 1.00 1.07  ?CALCIUM 10.0 9.9 9.9  ? ?LFT ?Recent Labs  ?  07/15/21 ?0500  ?PROT 5.7*  ?ALBUMIN 2.4*  ?AST 18  ?ALT 14  ?ALKPHOS 50  ?BILITOT 0.9  ? ? ? ?Studies/Results ?DG Wrist Complete Left ? ?Result Date: 07/14/2021 ?CLINICAL DATA:  Generalized weakness since being discharged from [facility] 1 week ago. Also complains of bilateral wrist swelling and soreness. EXAM: LEFT WRIST - COMPLETE 3+ VIEW COMPARISON:  None. FINDINGS: There is no evidence of an acute fracture or dislocation. A chronic fracture deformity is  seen involving the distal shaft of the left radius. An additional chronic fracture of the left ulnar styloid is noted. Marked severity degenerative changes are noted throughout the left wrist with a chronic deformity is seen involving the left scaphoid bone. There is mild diffuse soft tissue swelling. IMPRESSION: 1. Marked severity degenerative changes without an acute fracture or dislocation. 2. Chronic fractures of the distal left radius and left ulnar styloid. Electronically Signed   By: Virgina Norfolk M.D.   On: 07/14/2021 00:21  ? ?DG Wrist Complete Right ? ?Result Date: 07/14/2021 ?CLINICAL DATA:  Generalized weakness since being discharged from [place] 1 week ago with complaints of bilateral wrist swelling and soreness. EXAM: RIGHT WRIST - COMPLETE 3+ VIEW COMPARISON:  None. FINDINGS: There is no evidence of fracture or dislocation. Mild degenerative changes are seen along the radiocarpal joint and along the carpal metacarpal articulation of the right thumb. Mild diffuse soft tissue swelling is seen. IMPRESSION: No acute osseous abnormality. Electronically Signed   By: Virgina Norfolk M.D.   On: 07/14/2021 00:23  ? ?CT Head Wo Contrast ? ?Result Date: 07/14/2021 ?CLINICAL DATA:  Generalized weakness since being discharged from [facility] 1 week ago. EXAM: CT HEAD WITHOUT CONTRAST TECHNIQUE: Contiguous axial images were obtained from the base of the skull through the vertex without intravenous contrast. RADIATION DOSE REDUCTION: This exam was performed  according to the departmental dose-optimization program which includes automated exposure control, adjustment of the mA and/or kV according to patient size and/or use of iterative reconstruction technique. COMPARISON:  January 07, 2020 FINDINGS: Brain: No evidence of acute infarction, hemorrhage, hydrocephalus, extra-axial collection or mass lesion/mass effect. Vascular: No hyperdense vessel or unexpected calcification. Skull: Normal. Negative for  fracture or focal lesion. Sinuses/Orbits: No acute finding. Other: None. IMPRESSION: 1. No acute intracranial process. 2. Generalized cerebral atrophy with chronic white matter small vessel ischemic changes. Electronically Signed   By: Virgina Norfolk M.D.   On: 07/14/2021 01:51  ? ?DG Chest Portable 1 View ? ?Result Date: 07/14/2021 ?CLINICAL DATA:  Generalized weakness since being discharged from [facility] 1 week ago. EXAM: PORTABLE CHEST 1 VIEW COMPARISON:  February 14, 2021 FINDINGS: The heart size and mediastinal contours are within normal limits. There is marked severity calcification of the aortic arch. Mild atelectasis is seen within the bilateral lung bases. Both lungs are otherwise clear. The visualized skeletal structures are unremarkable. IMPRESSION: No acute cardiopulmonary disease. Electronically Signed   By: Virgina Norfolk M.D.   On: 07/14/2021 00:19   ? ?Assessment  ?87 y.o. male with a history of HTN, MI, CHF, afib, presenting with generalized weakness and declining after fall approximately 2 weeks ago, poor oral intake, found to have worsening anemia from baseline and heme positive stool in setting of chronic anticoagulation (Last dose of Eliquis 4/14). GI now consulted due to symptomatic anemia.  ? ?Anemia: review of labs with Hgb 11/12 range. Admitting Hgb 9.8. Slow drift during admission without overt GI bleeding. B12 markedly low at 130. Outside CT Aug 2021 with possible sigmoid colon polyp. CT Jan 2023 without mention of this. Plans had been for colonoscopy/EGD today, and patient prepped overnight. However, family is concerned about risks of anesthesia, stating after further discussion with multiple family members and wife, that patient had mentioned in past he did not want to pursue any procedures with anesthesia.  ? ? ?Family desires to wait until mental status improves and patient can make his own decision. Today, he is improved as alert now, but he remains confused. Will pursue a  conservative approach and could pursue outpatient procedures if patient and family still desires.  ? ? ?Plan / Recommendations  ?Continue PPI BID ?Advance to full liquids. Soft diet this evening if tolerates fulls ?Eliquis on hold ?Family desiring to hold off on procedures until patient is able to decide this on own.  ?Follow H/H  ? ? ? LOS: 1 day  ? ? 07/16/2021, 10:45 AM ? ?Annitta Needs, PhD, ANP-BC ?[facility]  ? ? ? ?

## 2021-07-16 NOTE — Plan of Care (Signed)

## 2021-07-17 ENCOUNTER — Encounter (HOSPITAL_COMMUNITY)
Admission: EM | Disposition: A | Discharge status: Home Health | Payer: Self-pay | Source: Home / Self Care | Attending: Internal Medicine

## 2021-07-17 ENCOUNTER — Inpatient Hospital Stay (HOSPITAL_COMMUNITY): Payer: Medicare Other | Admitting: Anesthesiology

## 2021-07-17 ENCOUNTER — Encounter (HOSPITAL_COMMUNITY): Payer: Self-pay | Admitting: Internal Medicine

## 2021-07-17 DIAGNOSIS — K635 Polyp of colon: Secondary | ICD-10-CM

## 2021-07-17 DIAGNOSIS — K5710 Diverticulosis of small intestine without perforation or abscess without bleeding: Secondary | ICD-10-CM | POA: Diagnosis not present

## 2021-07-17 DIAGNOSIS — K449 Diaphragmatic hernia without obstruction or gangrene: Secondary | ICD-10-CM

## 2021-07-17 DIAGNOSIS — K5750 Diverticulosis of both small and large intestine without perforation or abscess without bleeding: Secondary | ICD-10-CM

## 2021-07-17 DIAGNOSIS — K648 Other hemorrhoids: Secondary | ICD-10-CM

## 2021-07-17 DIAGNOSIS — K5730 Diverticulosis of large intestine without perforation or abscess without bleeding: Secondary | ICD-10-CM

## 2021-07-17 DIAGNOSIS — D649 Anemia, unspecified: Secondary | ICD-10-CM

## 2021-07-17 DIAGNOSIS — K922 Gastrointestinal hemorrhage, unspecified: Secondary | ICD-10-CM | POA: Diagnosis not present

## 2021-07-17 DIAGNOSIS — R531 Weakness: Secondary | ICD-10-CM | POA: Diagnosis not present

## 2021-07-17 HISTORY — PX: POLYPECTOMY: SHX149

## 2021-07-17 HISTORY — PX: ESOPHAGOGASTRODUODENOSCOPY (EGD) WITH PROPOFOL: SHX5813

## 2021-07-17 HISTORY — PX: HEMOSTASIS CLIP PLACEMENT: SHX6857

## 2021-07-17 HISTORY — PX: COLONOSCOPY WITH PROPOFOL: SHX5780

## 2021-07-17 LAB — GLUCOSE, CAPILLARY
Glucose-Capillary: 107 mg/dL — ABNORMAL HIGH (ref 70–99)
Glucose-Capillary: 109 mg/dL — ABNORMAL HIGH (ref 70–99)
Glucose-Capillary: 155 mg/dL — ABNORMAL HIGH (ref 70–99)

## 2021-07-17 LAB — BASIC METABOLIC PANEL
Anion gap: 7 (ref 5–15)
BUN: 20 mg/dL (ref 8–23)
CO2: 25 mmol/L (ref 22–32)
Calcium: 10.3 mg/dL (ref 8.9–10.3)
Chloride: 104 mmol/L (ref 98–111)
Creatinine, Ser: 1.01 mg/dL (ref 0.61–1.24)
GFR, Estimated: >60 mL/min (ref >60)
Glucose, Bld: 111 mg/dL — ABNORMAL HIGH (ref 70–99)
Potassium: 3.7 mmol/L (ref 3.5–5.1)
Sodium: 136 mmol/L (ref 135–145)

## 2021-07-17 LAB — CBC
HCT: 27.1 % — ABNORMAL LOW (ref 39.0–52.0)
Hemoglobin: 9 g/dL — ABNORMAL LOW (ref 13.0–17.0)
MCH: 29.1 pg (ref 26.0–34.0)
MCHC: 33.2 g/dL (ref 30.0–36.0)
MCV: 87.7 fL (ref 80.0–100.0)
Platelets: 411 10*3/uL — ABNORMAL HIGH (ref 150–400)
RBC: 3.09 MIL/uL — ABNORMAL LOW (ref 4.22–5.81)
RDW: 14.6 % (ref 11.5–15.5)
WBC: 5.7 10*3/uL (ref 4.0–10.5)
nRBC: 0 % (ref 0.0–0.2)

## 2021-07-17 SURGERY — COLONOSCOPY WITH PROPOFOL
Anesthesia: General

## 2021-07-17 MED ORDER — SODIUM CHLORIDE 0.9 % IV SOLN: INTRAVENOUS | Status: DC

## 2021-07-17 MED ORDER — PROPOFOL 500 MG/50ML IV EMUL
INTRAVENOUS | Status: DC | PRN
Start: 2021-07-17 — End: 2021-07-17
  Administered 2021-07-17: 50 ug/kg/min via INTRAVENOUS

## 2021-07-17 MED ORDER — LACTATED RINGERS IV SOLN: INTRAVENOUS | Status: DC | PRN

## 2021-07-17 MED ORDER — PHENYLEPHRINE HCL (PRESSORS) 10 MG/ML IV SOLN
INTRAVENOUS | Status: DC | PRN
  Administered 2021-07-17: 80 ug via INTRAVENOUS

## 2021-07-17 MED ORDER — LACTATED RINGERS IV SOLN: INTRAVENOUS | Status: DC

## 2021-07-17 MED ORDER — PROPOFOL 10 MG/ML IV BOLUS
INTRAVENOUS | Status: DC | PRN
  Administered 2021-07-17: 60 mg via INTRAVENOUS

## 2021-07-17 NOTE — Brief Op Note (Signed)
07/13/2021 - 07/17/2021 ? ?4:10 PM ? ?PATIENT:  Carlos Perkins  87 y.o. male ? ?PRE-OPERATIVE DIAGNOSIS:  anemia, heme positive stool ? ?POST-OPERATIVE DIAGNOSIS:  EGD: hiatal hernia ?COLON: hemorrhoids; diverticulosis; cecal colon polyp (CS); ascending colon poylps x3 (CS); transverse colon polyps x2 (CS); descending colon polyp (CS); sigmoid colon polyps x2 (CS); sigmoid colon polyp (HS) with hemostasis clip ? ?PROCEDURE:  Procedure(s): ?COLONOSCOPY WITH PROPOFOL (N/A) ?ESOPHAGOGASTRODUODENOSCOPY (EGD) WITH PROPOFOL (N/A) ?POLYPECTOMY INTESTINAL ?HEMOSTASIS CLIP PLACEMENT ? ?SURGEON:  Surgeon(s) and Role: ?   Harvel Quale, MD - Primary ? ?Patient underwent EGD and colonoscopy under propofol sedation.  Tolerated the procedure adequately.  Esophagus showed 3 cm hiatal hernia was present. The entire examined stomach was normal.  Upon careful inspection, no presence of hematin or active bleeding was present. A large non-bleeding diverticulum was found in the second portion of the duodenum.  ? ?Colonoscopy showed normal TI. Six sessile polyps were found in the transverse colon, ascending colon and cecum.  The polyps were 3 to 6 mm in size.  These polyps were removed with a cold snare.  Resection and retrieval were complete. Three sessile polyps were found in the sigmoid colon and descending colon.  The polyps were 3 to 6 mm in size.  These polyps were removed with a cold snare.  Resection and retrieval were complete. A 12 mm polyp was found in the sigmoid colon.  The polyp was pedunculated.  The polyp was removed with a hot snare.  Resection and retrieval were complete.  To prevent bleeding after the polypectomy, one hemostatic clip was successfully placed.  There was no bleeding at the end of the procedure. Scattered small and large-mouthed diverticula were found in the entire colon. Non-bleeding internal hemorrhoids were found during retroflexion.  The hemorrhoids were medium-sized.   ? ?RECOMMENDATIONS ?- Return patient to hospital ward for ongoing care.  ?- Resume previous diet.  ?- Monitor H/H every day ?- Check iron stores. ?- Await pathology results.  ?- Repeat colonoscopy is not recommended due to current age (49 years or older) for screening purposes.  ? ?[name], MD ?Gastroenterology and Hepatology ?[facility] ? ?

## 2021-07-17 NOTE — Anesthesia Preprocedure Evaluation (Signed)
Anesthesia Evaluation  ?Patient identified by MRN, date of birth, ID band ?Patient awake and Patient confused ? ? ? ?Reviewed: ?Allergy & Precautions, H&P , NPO status , Patient's Chart, lab work & pertinent test results, reviewed documented beta blocker date and time  ? ?Airway ?Mallampati: II ? ?TM Distance: >3 FB ?Neck ROM: full ? ? ? Dental ?no notable dental hx. ? ?  ?Pulmonary ?neg pulmonary ROS,  ?  ?Pulmonary exam normal ?breath sounds clear to auscultation ? ? ? ? ? ? Cardiovascular ?Exercise Tolerance: Good ?hypertension, + Past MI  ? ?Rhythm:regular Rate:Normal ? ? ?  ?Neuro/Psych ? Neuromuscular disease negative psych ROS  ? GI/Hepatic ?negative GI ROS, Neg liver ROS,   ?Endo/Other  ?negative endocrine ROS ? Renal/GU ?negative Renal ROS  ?negative genitourinary ?  ?Musculoskeletal ? ? Abdominal ?  ?Peds ? Hematology ? ?(+) Blood dyscrasia, anemia ,   ?Anesthesia Other Findings ? ? Reproductive/Obstetrics ?negative OB ROS ? ?  ? ? ? ? ? ? ? ? ? ? ? ? ? ?  ?  ? ? ? ? ? ? ? ? ?Anesthesia Physical ?Anesthesia Plan ? ?ASA: 3 ? ?Anesthesia Plan: General  ? ?Post-op Pain Management:   ? ?Induction:  ? ?PONV Risk Score and Plan: Propofol infusion ? ?Airway Management Planned:  ? ?Additional Equipment:  ? ?Intra-op Plan:  ? ?Post-operative Plan:  ? ?Informed Consent: I have reviewed the patients History and Physical, chart, labs and discussed the procedure including the risks, benefits and alternatives for the proposed anesthesia with the patient or authorized representative who has indicated his/her understanding and acceptance.  ? ? ? ?Dental Advisory Given ? ?Plan Discussed with: CRNA ? ?Anesthesia Plan Comments:   ? ? ? ? ? ? ?Anesthesia Quick Evaluation ? ?

## 2021-07-17 NOTE — Op Note (Signed)
[facility] ?Patient Name: Carlos Perkins ?Procedure Date: 07/17/2021 3:14 PM ?MRN: [id number] ?Date of Birth: 11-May-1934 ?Attending MD: [name] ,  ?CSN: [id number] ?Age: 87 ?Admit Type: Outpatient ?Procedure:                Upper GI endoscopy ?Indications:              Anemia ?Providers:                [name], Crystal Page, Randa Spike,  ?                          Technician ?Referring MD:             Elon Alas. Abbey Chatters, DO ?Medicines:                Monitored Anesthesia Care ?Complications:            No immediate complications. ?Estimated Blood Loss:     Estimated blood loss: none. ?Procedure:                Pre-Anesthesia Assessment: ?                          - Prior to the procedure, a History and Physical  ?                          was performed, and patient medications, allergies  ?                          and sensitivities were reviewed. The patient's  ?                          tolerance of previous anesthesia was reviewed. ?                          - The risks and benefits of the procedure and the  ?                          sedation options and risks were discussed with the  ?                          patient. All questions were answered and informed  ?                          consent was obtained. ?                          - ASA Grade Assessment: III - A patient with severe  ?                          systemic disease. ?                          After obtaining informed consent, the endoscope was  ?                          passed under direct vision. Throughout the  ?  procedure, the patient's blood pressure, pulse, and  ?                          oxygen saturations were monitored continuously. The  ?                          GIF-H190 ([id number]) scope was introduced through the  ?                          mouth, and advanced to the second part of duodenum.  ?                          The upper GI endoscopy was accomplished without  ?                           difficulty. The patient tolerated the procedure  ?                          well. ?Scope In: 3:27:54 PM ?Scope Out: 3:31:41 PM ?Total Procedure Duration: 0 hours 3 minutes 47 seconds  ?Findings: ?     A 3 cm hiatal hernia was present. ?     The entire examined stomach was normal. Upon careful inspection, no  ?     presence of hematin or active bleeding was present. ?     A large non-bleeding diverticulum was found in the second portion of the  ?     duodenum. ?     The exam of the duodenum was otherwise normal. ?Impression:               - 3 cm hiatal hernia. ?                          - Normal stomach. ?                          - Non-bleeding duodenal diverticulum. ?                          - No specimens collected. ?Moderate Sedation: ?     Per Anesthesia Care ?Recommendation:           - Return patient to hospital ward for ongoing care. ?                          - Resume previous diet. ?                          - Monitor H/H every day ?                          - Check iron stores. ?Procedure Code(s):        --- Professional --- ?                          [phone], Esophagogastroduodenoscopy, flexible,  ?  transoral; diagnostic, including collection of  ?                          specimen(s) by brushing or washing, when performed  ?                          (separate procedure) ?Diagnosis Code(s):        --- Professional --- ?                          K44.9, Diaphragmatic hernia without obstruction or  ?                          gangrene ?                          D64.9, Anemia, unspecified ?                          K57.10, Diverticulosis of small intestine without  ?                          perforation or abscess without bleeding ?CPT copyright 2019 [employer]. All rights reserved. ?The codes documented in this report are preliminary and upon coder review may  ?be revised to meet current compliance requirements. ?[name], MD ?[name],   ?07/17/2021 4:11:28 PM ?This report has been signed electronically. ?Number of Addenda: 0 ?

## 2021-07-17 NOTE — Care Management Important Message (Signed)
Important Message ? ?Patient Details  ?Name: Carlos Perkins ?MRN: [id number] ?Date of Birth: 03-24-1935 ? ? ?Medicare Important Message Given:  N/A - LOS <3 / Initial given by admissions ? ? ? ? ?Tommy Medal ?07/17/2021, 10:37 AM ?

## 2021-07-17 NOTE — Progress Notes (Signed)
?Progress Note ? ? ?Patient: Carlos Perkins WSF:[id number] DOB: 09/07/1934 DOA: 07/13/2021     2 ?DOS: the patient was seen and examined on 07/17/2021 ?  ?Brief hospital course: ?As per H&P written by Dr. Clearence Ped on 07/14/21 ?Carlos Perkins is a 87 y.o. male with medical history significant of hyperlipidemia, hypertension, myocardial infarction, CHF, paroxysmal atrial fibrillation, and more presents the ED with a chief complaint of generalized weakness.  Patient's family had reported to the ED provider that patient has had a general decline over a couple of months.  He lost his closest living relative 2 months ago, and has been declining since then.  Patient reports that he had a fall 2 weeks ago on Monday.  Since then he thinks he has been getting worse.  He reports he cannot stand up mostly due to weakness, but also pain.  He has sharp pains in his toes when he tries to stand.  He reports the weakness and the pains are equal in the bilateral lower extremities.  He also has sharp pains in his wrist.  His grip strength is significantly decreased because of this.  He reports the symptoms have been present for 2 weeks.  He denies any fatigue.  He reports he is short of breath but its not exertional.  He denies chest pain, hematochezia, melena, fever.  He denies asymmetric weakness, numbness, or hypersensitivity.  He has no other complaints at this time. ?  ?Patient does not smoke, does not drink alcohol, does not use illicit drugs.  He is vaccinated for COVID.  Patient is DNR. ? ?Assessment and Plan: ?* Generalized weakness ?- Most likely secondary to deconditioning; but multifactorial. ?-TSH within normal limits ?-B12 significantly low and will be repleted. ?-Patient also with decreased oral intake for 3 days or so prior to admission given also concern for dehydration and electrolyte abnormality. ?-Gentle fluid resuscitation has been provided and electrolytes have been repleted. ?-Will continue  encouraging him to have oral intake. ?-No signs of acute infection appreciated ?-CT head without acute intracranial normalities. ?-Ammonia is less than 10 ?-Patient is slightly anemic with a hemoglobin of 9.8>>9.0 and positive fecal occult blood test. ?-Case discussed with gastroenterology service who has recommended endoscopic evaluation.  Family in agreement. ?-Baseline hemoglobin is 11 ? ?GI bleed ?-Hemoglobin baseline around 11 range. ?-FOBT positive ?-Denies hematochezia or melena ?-Appreciate assistance and recommendation by GI therapy ?-Pending (EGD and colonoscopy), hopefully tomorrow. ?-Continue holding anticoagulation. ? ?Hypokalemia ?-Potassium 3.4 at time of admission ?-Repleted and within normal limits currently. ?-Continue to follow electrolytes trend. ?-Presentation of low potassium in the setting of decreased oral intake and continued use of diuretics. ? ? ?Anemia ?-Hemoglobin baseline around 11  ?-Today 9.0 ?-FOBT positive ?-Continue to follow hemoglobin trend; follow GI service recommendations. ? ?PAF (paroxysmal atrial fibrillation) ([facility]) ?-Continue beta-blocker  ?-Currently in A-fib, rate controlled ?-Continue telemetry monitoring ?-Holding anticoagulation in the setting of positive fecal occult blood test and need for endoscopic evaluation. ? ?Heart failure with reduced ejection fraction ([place]) ?-Continue Lasix, Coreg, ACE and spironolactone as an outpatient. ?-Peripheral edema present, but not in acute exacerbation ?-Patient swelling has improved ?-Will hold spironolactone in anticipation for bowel prep for endoscopic evaluation. ?-Continue the rest of his medication ?-Continue to follow daily weights and strict I's and O's. ? ? ?Prostate cancer metastatic to bone [facility]) ?-Continue outpatient follow-up with urology/oncology ?-Currently taking Zytiga, which is on hold at this time. ? ? ?Hypertension ?-Continue Coreg and ACE ?-Continue to follow vital  signs. ? ?Hyperlipidemia,  unspecified ?-Continue statin ? ? ?Subjective:  ?No overt bleeding appreciated; patient is alert, more interactive and with improved judgment and insight.  Still severely weak and deconditioned.  Reports no nausea, no vomiting, no abdominal pain, no chest pain or shortness of breath. ? ?Physical Exam: ?Vitals:  ? 07/16/21 2029 07/17/21 0515 07/17/21 0700 07/17/21 1119  ?BP: (!) 121/53 115/77  (!) 96/55  ?Pulse: 94 89  87  ?Resp: 19 17  (!) 25  ?Temp: 98.4 ?F (36.9 ?C) 98.2 ?F (36.8 ?C)  98.4 ?F (36.9 ?C)  ?TempSrc: Oral Oral    ?SpO2: 96% 97%  92%  ?Weight:   88.9 kg   ?Height:      ? ?General exam: Alert, awake, oriented x 2; following commands appropriately and able to interact much better.  Wanting to proceed with endoscopic evaluations.  No fever, no chest pain, no nausea, no vomiting, no overt bleeding. ?Respiratory system: Clear to auscultation. Respiratory effort normal.  Good saturation on room air.  No using accessory muscle. ?Cardiovascular system: Rate controlled; irregular rate and.  No rubs or gallops appreciated.  No JVD on exam. ?Gastrointestinal system: Abdomen is nondistended, soft and nontender. No organomegaly or masses felt. Normal bowel sounds heard. ?Central nervous system: Alert and oriented.  Generally weak and deconditioned; no focal deficits appreciated. ?Extremities: No cyanosis or clubbing. ?Skin: No petechiae. ?Psychiatry: Overall with improved judgment and insight; no hallucinations or suicidal ideation.  Flat affect. ? ?Data Reviewed: ?B12 130 ?CBC demonstrating a WBC of 5.7, hemoglobin 9.0 and platelet count 411 K. ?Basic metabolic panel demonstrating sodium 136, potassium 3.7, BUN 20 and creatinine 1.01. ? ?Family Communication: Daughters at bedside. ? ?Disposition: ?Status is: Inpatient ?Remains inpatient appropriate because: Still in need of evaluation for GI bleed and currently experiencing acute encephalopathy. ? ? Planned Discharge Destination: Home with home health  services. ? ?Author: ?Barton Dubois, MD ?07/17/2021 12:33 PM ? ?For on call review [email or web address].  ?

## 2021-07-17 NOTE — TOC Progression Note (Addendum)
Transition of Care (TOC) - Progression Note  ? ? ?Patient Details  ?Name: Carlos Perkins ?MRN: [id number] ?Date of Birth: Jul 25, 1934 ? ?Transition of Care (TOC) CM/SW Contact  ?Joaquin Courts, RN ?Phone Number: ?07/17/2021, 12:11 PM ? ?Clinical Narrative:   CM outreached to [employer] main referral line for hospital bed.  Rep provided with updated delivery address of [place] [address].  CM spoke with patient's daughter Carlos Perkins who will be the contact person for delivery and can be reached at [phone],  contact information provided to [employer]. No further TOC needs identified. ? ? ?Expected Discharge Plan: [place] ?Barriers to Discharge: Continued Medical Work up ? ?Expected Discharge Plan and Services ?Expected Discharge Plan: [place] ?In-house Referral: Clinical Social Work ?  ?Post Acute Care Choice: Home Health, Durable Medical Equipment ?Living arrangements for the past 2 months: [place] ?                ?DME Arranged: Hospital bed ?DME Agency: [employer] ?Date DME Agency Contacted: 07/17/21 ?Time DME Agency Contacted: 1211 ?Representative spoke with at DME Agency: main referral line ?  ?  ?  ?  ?  ? ? ?Social Determinants of Health (SDOH) Interventions ?  ? ?Readmission Risk Interventions ?   ? View : No data to display.  ?  ?  ?  ? ? ?

## 2021-07-17 NOTE — Op Note (Signed)
[facility] ?Patient Name: Carlos Perkins ?Procedure Date: 07/17/2021 3:33 PM ?MRN: [id number] ?Date of Birth: 10-Sep-1934 ?Attending MD: [name] ,  ?CSN: [id number] ?Age: 87 ?Admit Type: Inpatient ?Procedure:                Colonoscopy ?Indications:              Anemia ?Providers:                [name], Crystal Page, Randa Spike,  ?                          Technician ?Referring MD:             Elon Alas. Abbey Chatters, DO ?Medicines:                Monitored Anesthesia Care ?Complications:            No immediate complications. ?Estimated Blood Loss:     Estimated blood loss: none. ?Procedure:                Pre-Anesthesia Assessment: ?                          - Prior to the procedure, a History and Physical  ?                          was performed, and patient medications, allergies  ?                          and sensitivities were reviewed. The patient's  ?                          tolerance of previous anesthesia was reviewed. ?                          - The risks and benefits of the procedure and the  ?                          sedation options and risks were discussed with the  ?                          patient. All questions were answered and informed  ?                          consent was obtained. ?                          - ASA Grade Assessment: III - A patient with severe  ?                          systemic disease. ?                          After obtaining informed consent, the colonoscope  ?                          was passed under direct vision. Throughout the  ?  procedure, the patient's blood pressure, pulse, and  ?                          oxygen saturations were monitored continuously. The  ?                          [id number]-HQ190L ([id number]) scope was introduced through  ?                          the anus and advanced to the the terminal ileum.  ?                          The colonoscopy was performed without difficulty.  ?                           The patient tolerated the procedure well. The  ?                          quality of the bowel preparation was fair. ?Scope In: 3:35:34 PM ?Scope Out: 4:07:24 PM ?Scope Withdrawal Time: 0 hours 28 minutes 27 seconds  ?Total Procedure Duration: 0 hours 31 minutes 50 seconds  ?Findings: ?     The perianal and digital rectal examinations were normal. ?     The terminal ileum appeared normal. ?     Six sessile polyps were found in the transverse colon, ascending colon  ?     and cecum. The polyps were 3 to 6 mm in size. These polyps were removed  ?     with a cold snare. Resection and retrieval were complete. ?     Three sessile polyps were found in the sigmoid colon and descending  ?     colon. The polyps were 3 to 6 mm in size. These polyps were removed with  ?     a cold snare. Resection and retrieval were complete. ?     A 12 mm polyp was found in the sigmoid colon. The polyp was  ?     pedunculated. The polyp was removed with a hot snare. Resection and  ?     retrieval were complete. To prevent bleeding after the polypectomy, one  ?     hemostatic clip was successfully placed. There was no bleeding at the  ?     end of the procedure. ?     Scattered small and large-mouthed diverticula were found in the entire  ?     colon. ?     Non-bleeding internal hemorrhoids were found during retroflexion. The  ?     hemorrhoids were medium-sized. ?Impression:               - Preparation of the colon was fair. ?                          - The examined portion of the ileum was normal. ?                          - Six 3 to 6 mm polyps in the transverse colon, in  ?  the ascending colon and in the cecum, removed with  ?                          a cold snare. Resected and retrieved. ?                          - Three 3 to 6 mm polyps in the sigmoid colon and  ?                          in the descending colon, removed with a cold snare.  ?                          Resected and retrieved. ?                           - One 12 mm polyp in the sigmoid colon, removed  ?                          with a hot snare. Resected and retrieved. Clip was  ?                          placed. ?                          - Diverticulosis in the entire examined colon. ?                          - Non-bleeding internal hemorrhoids. ?Moderate Sedation: ?     Per Anesthesia Care ?Recommendation:           - Return patient to hospital ward for ongoing care. ?                          - Resume previous diet. ?                          - Await pathology results. ?                          - Repeat colonoscopy is not recommended due to  ?                          current age (16 years or older) for screening  ?                          purposes. ?Procedure Code(s):        --- Professional --- ?                          [phone], Colonoscopy, flexible; with removal of  ?                          tumor(s), polyp(s), or other lesion(s) by snare  ?  technique ?Diagnosis Code(s):        --- Professional --- ?                          V78.4, Other hemorrhoids ?                          K63.5, Polyp of colon ?                          D64.9, Anemia, unspecified ?                          K57.30, Diverticulosis of large intestine without  ?                          perforation or abscess without bleeding ?CPT copyright 2019 American Medical Association. All rights reserved. ?The codes documented in this report are preliminary and upon coder review may  ?be revised to meet current compliance requirements. ?[name], MD ?[name],  ?07/17/2021 4:18:14 PM ?This report has been signed electronically. ?Number of Addenda: 0 ?

## 2021-07-17 NOTE — Anesthesia Postprocedure Evaluation (Signed)
Anesthesia Post Note ? ?Patient: Carlos Perkins ? ?Procedure(s) Performed: COLONOSCOPY WITH PROPOFOL ?ESOPHAGOGASTRODUODENOSCOPY (EGD) WITH PROPOFOL ?POLYPECTOMY INTESTINAL ?HEMOSTASIS CLIP PLACEMENT ? ?Patient location during evaluation: PACU ?Anesthesia Type: General ?Level of consciousness: awake and alert ?Pain management: pain level controlled ?Vital Signs Assessment: post-procedure vital signs reviewed and stable ?Respiratory status: spontaneous breathing, nonlabored ventilation, respiratory function stable and patient connected to nasal cannula oxygen ?Cardiovascular status: blood pressure returned to baseline and stable ?Postop Assessment: no apparent nausea or vomiting ?Anesthetic complications: no ? ? ?No notable events documented. ? ? ?Last Vitals:  ?Vitals:  ? 07/17/21 0515 07/17/21 1119  ?BP: 115/77 (!) 96/55  ?Pulse: 89 87  ?Resp: 17 (!) 25  ?Temp: 36.8 ?C 36.9 ?C  ?SpO2: 97% 92%  ?  ?Last Pain:  ?Vitals:  ? 07/17/21 1521  ?TempSrc:   ?PainSc: 0-No pain  ? ? ?  ?  ?  ?  ?  ?  ? ?Louann Sjogren ? ? ? ? ?

## 2021-07-17 NOTE — Progress Notes (Signed)
Patient requires frequent re-positioning of the body in ways that cannot be achieved with an ordinary bed or wedge pillow, to eliminate pain, reduce pressure, and the head of the bed to be elevated more than 30 degrees most of the time due to heart failure.  ?

## 2021-07-17 NOTE — Progress Notes (Signed)
We will proceed with EGD and colonoscopy as scheduled.  I thoroughly discussed with the patient his procedure, including the risks involved. Patient understands what the procedure involves including the benefits and any risks. Patient understands alternatives to the proposed procedure. Risks including (but not limited to) bleeding, tearing of the lining (perforation), rupture of adjacent organs, problems with heart and lung function, infection, and medication reactions. A small percentage of complications may require surgery, hospitalization, repeat endoscopic procedure, and/or transfusion.  Patient understood and agreed. ? ?Carlos Krysiak Castaneda, MD ?Gastroenterology and Hepatology ?[facility] ? ?

## 2021-07-17 NOTE — Transfer of Care (Signed)
Immediate Anesthesia Transfer of Care Note ? ?Patient: Cy Bresee ? ?Procedure(s) Performed: COLONOSCOPY WITH PROPOFOL ?ESOPHAGOGASTRODUODENOSCOPY (EGD) WITH PROPOFOL ?POLYPECTOMY INTESTINAL ?HEMOSTASIS CLIP PLACEMENT ? ?Patient Location: PACU ? ?Anesthesia Type:General ? ?Level of Consciousness: awake and alert  ? ?Airway & Oxygen Therapy: Patient Spontanous Breathing and Patient connected to nasal cannula oxygen ? ?Post-op Assessment: Report given to RN and Post -op Vital signs reviewed and stable ? ?Post vital signs: Reviewed and stable ? ?Last Vitals:  ?Vitals Value Taken Time  ?BP 138/115 07/17/21 1615  ?Temp    ?Pulse 88 07/17/21 1619  ?Resp 27 07/17/21 1619  ?SpO2 100 % 07/17/21 1619  ?Vitals shown include unvalidated device data. ? ?Last Pain:  ?Vitals:  ? 07/17/21 1521  ?TempSrc:   ?PainSc: 0-No pain  ?   ? ?  ? ?Complications: No notable events documented. ?

## 2021-07-18 ENCOUNTER — Inpatient Hospital Stay (HOSPITAL_COMMUNITY): Payer: Medicare Other

## 2021-07-18 ENCOUNTER — Encounter (HOSPITAL_COMMUNITY): Payer: Self-pay | Admitting: Gastroenterology

## 2021-07-18 DIAGNOSIS — D509 Iron deficiency anemia, unspecified: Secondary | ICD-10-CM | POA: Diagnosis not present

## 2021-07-18 DIAGNOSIS — Z7189 Other specified counseling: Secondary | ICD-10-CM | POA: Diagnosis not present

## 2021-07-18 DIAGNOSIS — R531 Weakness: Secondary | ICD-10-CM | POA: Diagnosis not present

## 2021-07-18 DIAGNOSIS — R6510 Systemic inflammatory response syndrome (SIRS) of non-infectious origin without acute organ dysfunction: Secondary | ICD-10-CM

## 2021-07-18 DIAGNOSIS — E782 Mixed hyperlipidemia: Secondary | ICD-10-CM | POA: Diagnosis not present

## 2021-07-18 DIAGNOSIS — Z515 Encounter for palliative care: Secondary | ICD-10-CM

## 2021-07-18 DIAGNOSIS — K922 Gastrointestinal hemorrhage, unspecified: Secondary | ICD-10-CM | POA: Diagnosis not present

## 2021-07-18 DIAGNOSIS — G9341 Metabolic encephalopathy: Secondary | ICD-10-CM

## 2021-07-18 DIAGNOSIS — E876 Hypokalemia: Secondary | ICD-10-CM | POA: Diagnosis not present

## 2021-07-18 LAB — BLOOD GAS, ARTERIAL
Acid-Base Excess: 3.9 mmol/L — ABNORMAL HIGH (ref 0.0–2.0)
Bicarbonate: 28.5 mmol/L — ABNORMAL HIGH (ref 20.0–28.0)
Drawn by: 41977
FIO2: 28 %
O2 Saturation: 96.1 %
Patient temperature: 39.3
pCO2 arterial: 46 mmHg (ref 32–48)
pH, Arterial: 7.41 (ref 7.35–7.45)
pO2, Arterial: 76 mmHg — ABNORMAL LOW (ref 83–108)

## 2021-07-18 LAB — COMPREHENSIVE METABOLIC PANEL
ALT: 57 U/L — ABNORMAL HIGH (ref 0–44)
AST: 81 U/L — ABNORMAL HIGH (ref 15–41)
Albumin: 2.4 g/dL — ABNORMAL LOW (ref 3.5–5.0)
Alkaline Phosphatase: 87 U/L (ref 38–126)
Anion gap: 6 (ref 5–15)
BUN: 23 mg/dL (ref 8–23)
CO2: 26 mmol/L (ref 22–32)
Calcium: 10.2 mg/dL (ref 8.9–10.3)
Chloride: 104 mmol/L (ref 98–111)
Creatinine, Ser: 1.08 mg/dL (ref 0.61–1.24)
GFR, Estimated: >60 mL/min (ref >60)
Glucose, Bld: 126 mg/dL — ABNORMAL HIGH (ref 70–99)
Potassium: 3.8 mmol/L (ref 3.5–5.1)
Sodium: 136 mmol/L (ref 135–145)
Total Bilirubin: 0.9 mg/dL (ref 0.3–1.2)
Total Protein: 6.1 g/dL — ABNORMAL LOW (ref 6.5–8.1)

## 2021-07-18 LAB — URINALYSIS, COMPLETE (UACMP) WITH MICROSCOPIC
Bilirubin Urine: NEGATIVE
Glucose, UA: NEGATIVE mg/dL
Ketones, ur: NEGATIVE mg/dL
Leukocytes,Ua: NEGATIVE
Nitrite: NEGATIVE
Protein, ur: 30 mg/dL — AB
Specific Gravity, Urine: 1.014 (ref 1.005–1.030)
pH: 6 (ref 5.0–8.0)

## 2021-07-18 LAB — FERRITIN: Ferritin: 346 ng/mL — ABNORMAL HIGH (ref 24–336)

## 2021-07-18 LAB — IRON AND TIBC
Iron: 18 ug/dL — ABNORMAL LOW (ref 45–182)
Saturation Ratios: 7 % — ABNORMAL LOW (ref 17.9–39.5)
TIBC: 249 ug/dL — ABNORMAL LOW (ref 250–450)
UIBC: 231 ug/dL

## 2021-07-18 LAB — CBC
HCT: 29.8 % — ABNORMAL LOW (ref 39.0–52.0)
Hemoglobin: 9.2 g/dL — ABNORMAL LOW (ref 13.0–17.0)
MCH: 27.5 pg (ref 26.0–34.0)
MCHC: 30.9 g/dL (ref 30.0–36.0)
MCV: 89.2 fL (ref 80.0–100.0)
Platelets: 450 10*3/uL — ABNORMAL HIGH (ref 150–400)
RBC: 3.34 MIL/uL — ABNORMAL LOW (ref 4.22–5.81)
RDW: 14.5 % (ref 11.5–15.5)
WBC: 4.6 10*3/uL (ref 4.0–10.5)
nRBC: 0 % (ref 0.0–0.2)

## 2021-07-18 LAB — GLUCOSE, CAPILLARY: Glucose-Capillary: 110 mg/dL — ABNORMAL HIGH (ref 70–99)

## 2021-07-18 LAB — MRSA NEXT GEN BY PCR, NASAL: MRSA by PCR Next Gen: NOT DETECTED

## 2021-07-18 LAB — CULTURE, BLOOD (ROUTINE X 2)
Culture: NO GROWTH
Special Requests: ADEQUATE

## 2021-07-18 LAB — MAGNESIUM: Magnesium: 2.1 mg/dL (ref 1.7–2.4)

## 2021-07-18 LAB — PROCALCITONIN: Procalcitonin: <0.1 ng/mL

## 2021-07-18 LAB — LACTIC ACID, PLASMA: Lactic Acid, Venous: 1.5 mmol/L (ref 0.5–1.9)

## 2021-07-18 MED ORDER — PANTOPRAZOLE SODIUM 40 MG IV SOLR
40.0000 mg | Freq: Two times a day (BID) | INTRAVENOUS | Status: DC
  Administered 2021-07-18 – 2021-07-22 (×8): 40 mg via INTRAVENOUS
  Filled 2021-07-18 (×8): qty 10

## 2021-07-18 MED ORDER — PIPERACILLIN-TAZOBACTAM 3.375 G IVPB
3.3750 g | Freq: Three times a day (TID) | INTRAVENOUS | Status: DC
  Administered 2021-07-18 – 2021-07-21 (×9): 3.375 g via INTRAVENOUS
  Filled 2021-07-18 (×6): qty 50

## 2021-07-18 MED ORDER — LACTATED RINGERS IV BOLUS
1000.0000 mL | Freq: Once | INTRAVENOUS | Status: AC
  Administered 2021-07-18: 1000 mL via INTRAVENOUS

## 2021-07-18 MED ORDER — LACTATED RINGERS IV SOLN: INTRAVENOUS | Status: AC

## 2021-07-18 NOTE — Progress Notes (Signed)
?  ?       ?PROGRESS NOTE ? ?Carlos Perkins SNK:[id number] DOB: [birth year] DOA: 07/13/2021 ?PCP: Idelle Crouch, MD ? ?Brief History:  ?As per H&P written by Dr. Clearence Ped on 07/14/21 ?Carlos Perkins is a 87 y.o. male with medical history significant of hyperlipidemia, hypertension, myocardial infarction, CHF, paroxysmal atrial fibrillation, and more presents the ED with a chief complaint of generalized weakness.  Patient's family had reported to the ED provider that patient has had a general decline over a couple of months.  He lost his closest living relative 2 months ago, and has been declining since then.  Patient reports that he had a fall 2 weeks ago on Monday.  Since then he thinks he has been getting worse.  He reports he cannot stand up mostly due to weakness, but also pain.  He has sharp pains in his toes when he tries to stand.  He reports the weakness and the pains are equal in the bilateral lower extremities.  He also has sharp pains in his wrist.  His grip strength is significantly decreased because of this.  He reports the symptoms have been present for 2 weeks.  He denies any fatigue.  He reports he is short of breath but its not exertional.  He denies chest pain, hematochezia, melena, fever.  He denies asymmetric weakness, numbness, or hypersensitivity.  He has no other complaints at this time. ?  ?Patient does not smoke, does not drink alcohol, does not use illicit drugs.  He is vaccinated for COVID.  Patient is DNR. ?On 07/18/21 am, patient had altered mental status with increasing lethargy.  Work up was initiated as discussed below. ?   ? ? ? ?Assessment and Plan: ?* Generalized weakness ?-multifactorial.indluding symptomatic anemia; infection, low B12, electrolyte derangements; deconditioning ?-TSH within normal limits ?-B12 significantly low and will be repleted. ?-Gentle fluid resuscitation has been provided and electrolytes have been repleted. ?-Will continue encouraging him to  have oral intake. ?-No signs of acute infection appreciated ?-CT head without acute intracranial normalities. ?-Ammonia is less than 10 ?-Case discussed with gastroenterology service who has recommended endoscopic evaluation.  Family in agreement. ?-Baseline hemoglobin is 11 ? ?Acute metabolic encephalopathy ?4/19 am--lethargic, but falls asleep without stimulation ?-suspect new infectious process with new fever 102.7 ?-ABG ?-CT brain ?-EKG ?-UA/culture ?-blood culture ?CMP/CBC ? ?SIRS (systemic inflammatory response syndrome) (HCC) ?4/19 am--fever 102.7 with tachypnea ?-check PCT ?-lactate ?-blood culture ?-CXR ?-UA/culture ?-start empiric zosyn ? ? ?PAF (paroxysmal atrial fibrillation) ([place]) ?-Continue beta-blocker  ?-Currently in A-fib, rate controlled ?-Continue telemetry monitoring ?-Holding anticoagulation in the setting of positive fecal occult blood test and need for endoscopic evaluation. ? ?Symptomatic anemia ?-Hemoglobin baseline around 11  ?-FOBT positive ?-Appreciate assistance and recommendation by GI  ?-4/18 colonoscopy-- 6 polyps in transv & asc colon, 3 polyps in sigmoid and desc colon; nonbleeding internal hemorrhoids ?-4/18 EGD--nonbleeding duodenal diverticulum; +hiatus hernia ? ?Hypokalemia ?-Potassium 3.4 at time of admission ?-Repleted and within normal limits currently. ?-Continue to follow electrolytes trend. ?-Presentation of low potassium in the setting of decreased oral intake and continued use of diuretics. ? ? ?Heart failure with reduced ejection fraction ([place]) ?-Continue Lasix, Coreg, ACE and spironolactone as an outpatient. ?-Peripheral edema present, but not in acute exacerbation ?-Patient swelling has improved ?-Will hold spironolactone in anticipation for bowel prep for endoscopic evaluation. ?-Continue the rest of his medication ?-Continue to follow daily weights and strict I's and O's. ? ? ?Prostate cancer metastatic to bone (  [place]) ?-Continue outpatient follow-up with  urology/oncology ?-Currently taking Zytiga, which is on hold at this time. ? ? ?Hypertension ?-Continue Coreg and ACE initially>>hold now as pt cannot take po due to lethargy ?-Continue to follow vital signs. ? ?Hyperlipidemia, unspecified ?-Continue statin ? ? ? ? ? ?Family Communication:   daughters updated  at bedside 4/19 ? ?Consultants:  none ? ?Code Status:  DNR ? ?DVT Prophylaxis:  SCDs ? ? ?Procedures: ?As Listed in Progress Note Above ? ?Antibiotics: ?Zosyn 4/19>> ? ? ? ? ? ?Subjective: ?Pt is lethargic.  ROS not possible.   ? ?Objective: ?Vitals:  ? 07/18/21 0034 07/18/21 0427 07/18/21 0700 07/18/21 1020  ?BP: 129/65 (!) 165/142  (!) 126/57  ?Pulse: 83 (!) 49  97  ?Resp: 18 19  (!) 24  ?Temp: 98.6 ?F (37 ?C) 99.2 ?F (37.3 ?C)  (!) 102.7 ?F (39.3 ?C)  ?TempSrc: Oral Oral  Axillary  ?SpO2: 92% (!) 73%  96%  ?Weight:   85 kg   ?Height:      ? ? ?Intake/Output Summary (Last 24 hours) at 07/18/2021 1059 ?Last data filed at 07/18/2021 0700 ?Gross per 24 hour  ?Intake 300 ml  ?Output 1100 ml  ?Net -800 ml  ? ?Weight change: -3.9 kg ?Exam: ? ?General:  Pt is alert, does not follow commands appropriately, not in acute distress ?HEENT: No icterus, No thrush, No neck mass, [place]/AT ?Cardiovascular: IRRR, S1/S2, no rubs, no gallops ?Respiratory: bilateral rales.  No wheeze ?Abdomen: Soft/+BS, non tender, non distended, no guarding ?Extremities: No edema, No lymphangitis, No petechiae, No rashes, no synovitis ? ? ?Data Reviewed: ?I have personally reviewed following labs and imaging studies ?Basic Metabolic Panel: ?Recent Labs  ?Lab 07/13/21 ?2143 07/15/21 ?0500 07/16/21 ?8338 07/17/21 ?0640  ?NA 132* 134* 133* 136  ?K 3.4* 4.3 4.2 3.7  ?CL 102 105 104 104  ?CO2 23 23 21* 25  ?GLUCOSE 123* 99 117* 111*  ?BUN '15 15 19 20  '$ ?CREATININE 1.05 1.00 1.07 1.01  ?CALCIUM 10.0 9.9 9.9 10.3  ?MG 2.0 1.8  --   --   ? ?Liver Function Tests: ?Recent Labs  ?Lab 07/15/21 ?0500  ?AST 18  ?ALT 14  ?ALKPHOS 50  ?BILITOT 0.9  ?PROT 5.7*   ?ALBUMIN 2.4*  ? ?No results for input(s): LIPASE, AMYLASE in the last 168 hours. ?Recent Labs  ?Lab 07/13/21 ?2505  ?AMMONIA <10  ? ?Coagulation Profile: ?No results for input(s): INR, PROTIME in the last 168 hours. ?CBC: ?Recent Labs  ?Lab 07/13/21 ?2143 07/14/21 ?0708 07/15/21 ?0500 07/16/21 ?3976 07/17/21 ?0640  ?WBC 7.6 6.7 6.9 8.0 5.7  ?NEUTROABS  --   --  4.8  --   --   ?HGB 9.8* 9.6* 9.0* 8.7* 9.0*  ?HCT 29.7* 29.3* 28.1* 27.7* 27.1*  ?MCV 86.3 87.5 87.8 89.1 87.7  ?PLT 355 326 358 333 411*  ? ?Cardiac Enzymes: ?No results for input(s): CKTOTAL, CKMB, CKMBINDEX, TROPONINI in the last 168 hours. ?BNP: ?Invalid input(s): POCBNP ?CBG: ?Recent Labs  ?Lab 07/16/21 ?2109 07/17/21 ?7341 07/17/21 ?9379 07/17/21 ?2109 07/18/21 ?1019  ?GLUCAP 122* 107* 109* 155* 110*  ? ?HbA1C: ?No results for input(s): HGBA1C in the last 72 hours. ?Urine analysis: ?   ?Component Value Date/Time  ? [place] YELLOW 07/13/2021 2137  ? APPEARANCEUR HAZY (A) 07/13/2021 2137  ? LABSPEC 1.010 07/13/2021 2137  ? PHURINE 6.0 07/13/2021 2137  ? GLUCOSEU 50 (A) 07/13/2021 2137  ? HGBUR SMALL (A) 07/13/2021 2137  ? BILIRUBINUR NEGATIVE 07/13/2021  2137  ? [place] NEGATIVE 07/13/2021 2137  ? PROTEINUR 30 (A) 07/13/2021 2137  ? NITRITE NEGATIVE 07/13/2021 2137  ? LEUKOCYTESUR NEGATIVE 07/13/2021 2137  ? ?Sepsis Labs: ?'@LABRCNTIP'$ (procalcitonin:4,lacticidven:4) ?) ?Recent Results (from the past 240 hour(s))  ?Blood culture (routine x 2)     Status: None  ? Collection Time: 07/13/21  9:43 PM  ? Specimen: BLOOD RIGHT FOREARM  ?Result Value Ref Range Status  ? Specimen Description   Final  ?  BLOOD RIGHT FOREARM BOTTLES DRAWN AEROBIC AND ANAEROBIC  ? Special Requests Blood Culture adequate volume  Final  ? Culture   Final  ?  NO GROWTH 5 DAYS ?Performed at [facility], [address] [place], [place] [address] ?  ? Report Status 07/18/2021 FINAL  Final  ?Blood culture (routine x 2)     Status: None (Preliminary result)  ? Collection Time: 07/13/21  11:57 PM  ? Specimen: Left Antecubital; Blood  ?Result Value Ref Range Status  ? Specimen Description   Final  ?  LEFT ANTECUBITAL BOTTLES DRAWN AEROBIC AND ANAEROBIC  ? Special Requests   Final  ?  Blood Culture res

## 2021-07-18 NOTE — Progress Notes (Signed)
?   07/18/21 1617  ?Assess: MEWS Score  ?Temp 99 ?F (37.2 ?C)  ?BP 105/67  ?Pulse Rate 75  ?Resp 20  ?SpO2 94 %  ?Assess: MEWS Score  ?MEWS Temp 0  ?MEWS Systolic 0  ?MEWS Pulse 0  ?MEWS RR 0  ?MEWS LOC 1  ?MEWS Score 1  ?MEWS Score Color Green  ? ? ?

## 2021-07-18 NOTE — Progress Notes (Signed)
?Subjective: ?Family at bedside states that patient had a restless night, he has been very difficult to arouse this morning. Chest xray and blood cultures being done for further evaluation as patient febrile at 102.7 this morning with tachypnea. Pt aroused briefly with stimulation during assessment by opening his eyes but was unable to answer any questions, immediately fell back to sleep after. Has increased WOB, now requiring supplemental O2. ? ?Objective: ?Vital signs in last 24 hours: ?Temp:  [98 ?F (36.7 ?C)-99.2 ?F (37.3 ?C)] 99.2 ?F (37.3 ?C) (04/19 0427) ?Pulse Rate:  [49-87] 49 (04/19 0427) ?Resp:  [17-25] 19 (04/19 0427) ?BP: (94-165)/(55-142) 165/142 (04/19 0427) ?SpO2:  [73 %-100 %] 73 % (04/19 0427) ?Weight:  [85 kg] 85 kg (04/19 0700) ?Last BM Date : 07/17/21 ?General:  altered mental status, increased lethargy ?Head:  Normocephalic and atraumatic. ?Eyes:  No icterus, sclera clear. Purulent dried drainage noted to eyelids  ?Mouth:  mucous membranes appear dry ?Heart:  S1, S2 present, no murmurs noted.  ?Lungs: Rhonchus bilaterally, increased WOB ?Abdomen:  Bowel sounds present, soft, non-tender, non-distended. No HSM or hernias noted. No rebound or guarding. No masses appreciated  ?Msk:  Symmetrical without gross deformities. Normal posture. ?Pulses:  Normal pulses noted. ?Extremities:  Without clubbing or edema. ?Neurologic:  Altered/lethargic. ?Skin:  dry, very warm to the touch ?Psych:  Altered/lethargic ? ?Intake/Output from previous day: ?04/18 0701 - 04/19 0700 ?In: 300 [I.V.:300] ?Out: 1100 [Urine:1100] ? ?Lab Results: ?Recent Labs  ?  07/16/21 ?9371 07/17/21 ?0640  ?WBC 8.0 5.7  ?HGB 8.7* 9.0*  ?HCT 27.7* 27.1*  ?PLT 333 411*  ? ?BMET ?Recent Labs  ?  07/16/21 ?6967 07/17/21 ?0640  ?NA 133* 136  ?K 4.2 3.7  ?CL 104 104  ?CO2 21* 25  ?GLUCOSE 117* 111*  ?BUN 19 20  ?CREATININE 1.07 1.01  ?CALCIUM 9.9 10.3  ? ?Assessment: ?Carlos Perkins is an 87 y.o. male with a history of HTN, MI, CHF, afib,  presenting with generalized weakness and declining after fall approximately 2 weeks ago, poor oral intake, found to have worsening anemia from baseline and +FOBT in setting of chronic anticoagulation (Last dose of Eliquis 4/14). Notably patient has had intermittent AMS throughout admission. GI consulted due to symptomatic anemia.  ? ?Anemia: baseline hgb 11-12 range, hgb 9.8 on admission, lowest at 8.7, up to 9 this morning.  no overt GI bleeding. B12 low at 130, on 102mg supplemental B12 daily since 4/16. Underwent EGD and Colonoscopy yesterday with 3cm hiatal hernia, non bleeding duodenal diverticulum on EGD, Colon prep was fair, six polyps in transverse, ascending and cecum, three polyps in sigmoid and descending colon, one larger polyp in sigmoid colon (clip placed) diverticulosis in entire colon, non bleeding internal hemorrhoids. Will check iron studies, consider proceeding with givens capsule study if iron stores are low. ? ?Notably, patient had a restless night, altered and difficult to arouse this morning with temperature of 102.7. no overt GI bleeding and given that hgb is stable, further evaluation of continued anemia will be completed after acute illness has resolved/patient is more stable. I discussed possible capsule study with daughter at bedside once patient's status has improved, she verbalized understanding of this.  ? ? ?Plan: ?Trend H&H daily ?Continue PPI BID ?Monitor for overt GI bleeding ?Follow for path results ?Iron studies ?Continue to hold Eliquis ?Consider capsule study if iron stores low ? ? LOS: 3 days  ? ? 07/18/2021, 9:04 AM ? ? ?Yuki Brunsman L. CAlver Sorrow MSN, APRN,  AGNP-C ?Adult-Gerontology Nurse Practitioner ?[facility] ? ?

## 2021-07-18 NOTE — Assessment & Plan Note (Addendum)
4/19 am--lethargic, but falls asleep without stimulation ?-suspect new infectious process and hypercalcemia ?4/21 corrected calcium 11.7 ?B12--130>>repleted IM ?Folate 10.1>>orally supplementing ?TSH 0.812 ?Ammonia <10 ?-ABG--7.41/46/76/28 2L ?-CT brain--neg for acute findings ?-UA/culture--no pyuria ?-blood culture--staph epi 2/2>>await final>>discussed with Dr. Drucilla Schmidt ?-07/23/21--mental status near baseline--conversant, following commands ?-increase IVF ?

## 2021-07-18 NOTE — Hospital Course (Signed)
As per H&P written by Dr. Clearence Ped on 07/14/21 ?Carlos Perkins is a 87 y.o. male with medical history significant of hyperlipidemia, hypertension, myocardial infarction, CHF, paroxysmal atrial fibrillation, and more presents the ED with a chief complaint of generalized weakness.  Patient's family had reported to the ED provider that patient has had a general decline over a couple of months.  He lost his closest living relative 2 months ago, and has been declining since then.  Patient reports that he had a fall 2 weeks ago on Monday.  Since then he thinks he has been getting worse.  He reports he cannot stand up mostly due to weakness, but also pain.  He has sharp pains in his toes when he tries to stand.  He reports the weakness and the pains are equal in the bilateral lower extremities.  He also has sharp pains in his wrist.  His grip strength is significantly decreased because of this.  He reports the symptoms have been present for 2 weeks.  He denies any fatigue.  He reports he is short of breath but its not exertional.  He denies chest pain, hematochezia, melena, fever.  He denies asymmetric weakness, numbness, or hypersensitivity.  He has no other complaints at this time. ?  ?Patient does not smoke, does not drink alcohol, does not use illicit drugs.  He is vaccinated for COVID.  Patient is DNR. ?On 07/18/21 am, patient had altered mental status with increasing lethargy.  Work up was initiated as discussed below. ?  ?

## 2021-07-18 NOTE — Progress Notes (Signed)
?   07/18/21 1140  ?Assess: MEWS Score  ?Temp (!) 100.5 ?F (38.1 ?C)  ?BP 132/73  ?ECG Heart Rate 88  ?Resp (!) 22  ?SpO2 98 %  ?Assess: MEWS Score  ?MEWS Temp 1  ?MEWS Systolic 0  ?MEWS Pulse 0  ?MEWS RR 1  ?MEWS LOC 1  ?MEWS Score 3  ?MEWS Score Color Yellow  ?Assess: if the MEWS score is Yellow or Red  ?Were vital signs taken at a resting state? Yes  ?Focused Assessment Change from prior assessment (see assessment flowsheet)  ?Early Detection of Sepsis Score *See Row Information* Medium  ?MEWS guidelines implemented *See Row Information* Yes  ?Treat  ?Pain Scale 0-10  ?Pain Score 0  ?Take Vital Signs  ?Increase Vital Sign Frequency  Yellow: Q 2hr X 2 then Q 4hr X 2, if remains yellow, continue Q 4hrs  ?Escalate  ?MEWS: Escalate Yellow: discuss with charge nurse/RN and consider discussing with provider and RRT  ?Notify: Charge Nurse/RN  ?Name of Charge Nurse/RN Notified Stanton Kidney RN  ?Date Charge Nurse/RN Notified 07/18/21  ?Time Charge Nurse/RN Notified 1145  ?Notify: Provider  ?Provider Name/Title MD Tat  ?Date Provider Notified 07/18/21  ?Time Provider Notified 1140  ?Notification Type Page ?(Secure chat)  ?Notification Reason Change in status ?(yellow MEWs)  ?Provider response No new orders  ?Date of Provider Response 07/18/21  ?Time of Provider Response 1200  ?Document  ?Patient Outcome Stabilized after interventions  ? ? ?

## 2021-07-18 NOTE — Progress Notes (Signed)
Patient responded to voice, opened eyes. Noted family offering patient fluids. Attempted to give patient PO medication noted patient having difficulty, patient holding medication in mouth and coughing after given one PO medication with water. MD Tat made aware of patients LOC. New order hold morning medications. Obtained vital signs: T-102.7 axillary, P-98, R-24, BP-126/57, O2-96% at 2 liters. Blood glucose-110. Patient red MEWS. MD Tat aware. New orders placed.  ?

## 2021-07-18 NOTE — Progress Notes (Signed)
Patients BP 88/61, P 91 in left arm, BP 93/46, P-87 in right arm. MD Tat made aware. New orders placed. Re-checked BP 105/67, P 75.  ? ? ?

## 2021-07-18 NOTE — Consult Note (Signed)
? ?                                                                                ?Consultation Note ?Date: 07/18/2021  ? ?Patient Name: Carlos Perkins  ?DOB: 02-14-1935  MRN: [id number]  Age / Sex: 87 y.o., male  ?PCP: Idelle Crouch, MD ?Referring Physician: Orson Eva, MD ? ?Reason for Consultation: Establishing goals of care ? ?HPI/Patient Profile: 87 y.o. male  with past medical history of hyperlipidemia, hypertension, myocardial infarction, HFrEF, paroxysmal atrial fibrillation, prostate cancer with bone mets (on Zytiga, Lupron; follows with Dr. Janese Banks [facility]) admitted on 07/13/2021 with weakness and pain in the setting of general decline over past few months after the loss of 2 close relatives. Being treated for symptomatic anemia, infection/SIRS, low B12, electrolyte derangements. S/P colonoscopy/EGD 4/18 with findings of multiple polyps, non-bleeding internal hemorrhoids, non-bleeding duodenal diverticulum, hiatal hernia. Anticoagulation on hold due to anemia.  ? ?Clinical Assessment and Goals of Care: ?I met today at Carlos Perkins's bedside after reviewing records from current hospital stay, oncology, recent visit to [facility]. Multiple family members at bedside including 5/6 of his children (2 are in from [place] until Sunday) and grandchildren. He is currently separated from his wife and his youngest son lives with him and youngest daughter lives with his wife (other children are from his first wife). They express that Mr. Jaggers has always been very active working 7 days a week. He has had to slow down more recently and more so over the past couple weeks. They do say that they notice him staying in bed/chair with more weakness, poorer appetite, and sleeping more. He is present for this conversation but sleeps throughout.  ? ?We reviewed the labs and test/diagnostics that have resulted and the ones we are waiting on to help Carlos Perkins identify the source of his declined mental status and fever. Some of the  children express to me that they have dealt with loss before and they want to be prepared and know when it is time for hospice. They say he would not want to die in the hospital and they would want him at home with hospice. They ask that we be direct when we feel like we have done what we can and if he is worsening so we can help him to get home. Son expresses that he would come get him even in the middle of the night even if we think it is only an hour left - very important that he does not die in the hospital if at all possible. They are all hopeful that he can have improvement and that we can take it day by day but most are realistic to his health issues and the chance that he may get worse instead of better.  ? ?We discussed the difference between reversible issues and decline that we do not have medications to turn around. We discussed with some general decline noticed that his body is weaker and starting in a worse state and even if we can treat him and help him improve it is difficult to know what his quality of life may be moving forward. We discussed that there is a  lot we can do to prolong life but sometimes these interventions do not make sense if we are prolonging people in a state of health they would not be happy to live with. Some family members having a more difficult time than others with these conversations. I did explain at their request DNR and what this means and that this really only applies if his body is dying (heart and lungs fail) and we would not do CPR, shock, or breathing machine but can provide measures as indicated to prevent Carlos Perkins from getting to that stage.  ? ?They express understanding and appreciation of the conversation and the care he has received here from everyone. All questions/concerns addressed. Emotional support provided.  ? ?Primary Decision Maker ?NEXT OF KIN wife is noted to be housebound and they are separated but not divorced. Majority of the adult children would be next  in line.  ?  ? ?SUMMARY OF RECOMMENDATIONS   ?- DNR previously decided by patient ?- He does not want to die in the hospital ?- Family open to further conversations about expectations and want open and honest communication and guidance ? ?Code Status/Advance Care Planning: ?DNR ? ? ?Symptom Management:  ?Per attending. Bone mets and chronic back pain. Relieved by repositioning during my visit.  ? ?Palliative Prophylaxis:  ?Bowel Regimen, Delirium Protocol, Frequent Pain Assessment, Oral Care, and Turn Reposition ? ?Prognosis:  ?Overall prognosis guarded. Time for outcomes and allow for response to treatments.  ? ?Discharge Planning: To Be Determined  ? ?  ? ?Primary Diagnoses: ?Present on Admission: ? PAF (paroxysmal atrial fibrillation) ([place]) ? Hyperlipidemia, unspecified ? Hypertension ? Heart failure with reduced ejection fraction ([place]) ? Prostate cancer metastatic to bone [facility]) ? ? ?I have reviewed the medical record, interviewed the patient and family, and examined the patient. The following aspects are pertinent. ? ?Past Medical History:  ?Diagnosis Date  ? Arthritis   ? Hyperlipidemia   ? Hypertension   ? Myocardial infarction [facility])   ? Prostate cancer ([place])   ? Prostate CA approximately 11 years ago  ? ?Social History  ? ?Socioeconomic History  ? Marital status: Married  ?  Spouse name: Not on file  ? Number of children: Not on file  ? Years of education: Not on file  ? Highest education level: Not on file  ?Occupational History  ? Not on file  ?Tobacco Use  ? Smoking status: Never  ? Smokeless tobacco: Never  ?Vaping Use  ? Vaping Use: Never used  ?Substance and Sexual Activity  ? Alcohol use: No  ? Drug use: No  ? Sexual activity: Not Currently  ?Other Topics Concern  ? Not on file  ?Social History Narrative  ? Not on file  ? ?Social Determinants of Health  ? ?Financial Resource Strain: Not on file  ?Food Insecurity: Not on file  ?Transportation Needs: Not on file  ?Physical Activity: Not on file  ?Stress:  Not on file  ?Social Connections: Not on file  ? ?Family History  ?Problem Relation Age of Onset  ? Stroke Mother   ? Heart attack Father   ? ?Scheduled Meds: ? atorvastatin  10 mg Oral Daily  ? carvedilol  25 mg Oral Daily  ? cinacalcet  30 mg Oral Once per day on Mon Wed Fri  ? cyanocobalamin  1,000 mcg Intramuscular Daily  ? furosemide  20 mg Oral Daily  ? lisinopril  40 mg Oral Daily  ? pantoprazole  40 mg Oral BID  ?  predniSONE  5 mg Oral Q breakfast  ? ?Continuous Infusions: ? sodium chloride 10 mL/hr at 07/14/21 1144  ? sodium chloride 10 mL/hr at 07/15/21 1615  ? piperacillin-tazobactam (ZOSYN)  IV    ? ?PRN Meds:.acetaminophen **OR** acetaminophen, ondansetron **OR** ondansetron (ZOFRAN) IV ?No Known Allergies ?Review of Systems  ?Unable to perform ROS: Acuity of condition  ? ?Physical Exam ?Vitals and nursing note reviewed.  ?Constitutional:   ?   General: He is sleeping.  ?   Appearance: He is ill-appearing.  ?Cardiovascular:  ?   Rate and Rhythm: Normal rate.  ?Pulmonary:  ?   Effort: No tachypnea, accessory muscle usage or respiratory distress.  ?Abdominal:  ?   Palpations: Abdomen is soft.  ?Neurological:  ?   Mental Status: He is lethargic.  ? ? ?Vital Signs: BP (!) 126/57 (BP Location: Left Arm)   Pulse 97   Temp (!) 102.7 ?F (39.3 ?C) (Axillary)   Resp (!) 24   Ht 5' 10" (1.778 m)   Wt 85 kg   SpO2 96%   BMI 26.89 kg/m?  ?Pain Scale: 0-10 ?  ?Pain Score: Asleep ? ? ?SpO2: SpO2: 96 % ?O2 Device:SpO2: 96 % ?O2 Flow Rate: .O2 Flow Rate (L/min): 2 L/min ? ?IO: Intake/output summary:  ?Intake/Output Summary (Last 24 hours) at 07/18/2021 1124 ?Last data filed at 07/18/2021 0700 ?Gross per 24 hour  ?Intake 300 ml  ?Output 350 ml  ?Net -50 ml  ? ? ?LBM: Last BM Date : 07/17/21 ?Baseline Weight: Weight: 86.2 kg ?Most recent weight: Weight: 85 kg     ?Palliative Assessment/Data: ? ? ? ? ?Time In: 1250 ? ?Time Total: 85 min ? ?Greater than 50%  of this time was spent counseling and coordinating care  related to the above assessment and plan. ? ?Signed by: ?Vinie Sill, NP ?Palliative Medicine Team ?Pager # [phone] (M-F 8a-5p) ?Team Phone # [phone] (Nights/Weekends) ?  ? ? ? ? ? ? ? ? ? ? ? ? ?  ?

## 2021-07-18 NOTE — TOC Progression Note (Signed)
Transition of Care (TOC) - Progression Note  ? ? ?Patient Details  ?Name: Carlos Perkins ?MRN: [id number] ?Date of Birth: 1934/12/24 ? ?Transition of Care (TOC) CM/SW Contact  ?Salome Arnt, LCSW ?Phone Number: ?07/18/2021, 11:36 AM ? ?Clinical Narrative:  LCSW left voicemail updating [name] with [facility] on pt. Per pt's daughter, hospital bed was delivered yesterday afternoon. TOC will continue to follow.    ? ? ? ?Expected Discharge Plan: [place] ?Barriers to Discharge: Continued Medical Work up ? ?Expected Discharge Plan and Services ?Expected Discharge Plan: [place] ?In-house Referral: Clinical Social Work ?  ?Post Acute Care Choice: Home Health, Durable Medical Equipment ?Living arrangements for the past 2 months: [place] ?                ?DME Arranged: Hospital bed ?DME Agency: [facility] ?Date DME Agency Contacted: 07/17/21 ?Time DME Agency Contacted: 1211 ?Representative spoke with at DME Agency: main referral line ?  ?  ?  ?  ?  ? ? ?Social Determinants of Health (SDOH) Interventions ?  ? ?Readmission Risk Interventions ?   ? View : No data to display.  ?  ?  ?  ? ? ?

## 2021-07-18 NOTE — Progress Notes (Signed)
?   07/18/21 1022  ?Assess: MEWS Score  ?Temp (!) 102.7 ?F (39.3 ?C)  ?BP (!) 126/57  ?Pulse Rate 97  ?Resp (!) 24  ?Level of Consciousness Responds to Voice  ?SpO2 96 %  ?O2 Device Nasal Cannula  ?O2 Flow Rate (L/min) 2 L/min  ?Assess: MEWS Score  ?MEWS Temp 2  ?MEWS Systolic 0  ?MEWS Pulse 0  ?MEWS RR 1  ?MEWS LOC 1  ?MEWS Score 4  ?MEWS Score Color Red  ?Assess: if the MEWS score is Yellow or Red  ?Were vital signs taken at a resting state? Yes  ?Focused Assessment Change from prior assessment (see assessment flowsheet)  ?Early Detection of Sepsis Score *See Row Information* High  ?MEWS guidelines implemented *See Row Information* Yes  ?Treat  ?Pain Scale 0-10  ?Pain Score 0  ?Take Vital Signs  ?Increase Vital Sign Frequency  Red: Q 1hr X 4 then Q 4hr X 4, if remains red, continue Q 4hrs  ?Escalate  ?MEWS: Escalate Red: discuss with charge nurse/RN and provider, consider discussing with RRT  ?Notify: Charge Nurse/RN  ?Name of Charge Nurse/RN Notified Crystal/Mary RN  ?Date Charge Nurse/RN Notified 07/18/21  ?Time Charge Nurse/RN Notified 1025  ?Notify: Provider  ?Provider Name/Title MD Tat  ?Date Provider Notified 07/18/21  ?Time Provider Notified 1025  ?Notification Type Page ?(Secure chat)  ?Notification Reason Change in status ?(Red MEWS)  ?Provider response At bedside  ?Date of Provider Response 07/18/21  ?Time of Provider Response 1028  ?Document  ?Patient Outcome Not stable and remains on department  ? ? ?

## 2021-07-18 NOTE — Assessment & Plan Note (Addendum)
4/19 am--fever 102.7 with tachypnea ?-check PCT <0.10 ?-lactate--1.5 ?-blood culture GPC one of two ?-CXR--personally reviewed>>L-base opacity ?-CT chest LLL atelectasis with L-effusion ?-UA/culture--urine culture = providencia ?-continue empiric zosyn>>unasyn ?-MRSA screen neg ? ?

## 2021-07-19 ENCOUNTER — Other Ambulatory Visit (HOSPITAL_COMMUNITY): Payer: Self-pay

## 2021-07-19 ENCOUNTER — Inpatient Hospital Stay (HOSPITAL_COMMUNITY): Payer: Medicare Other

## 2021-07-19 DIAGNOSIS — R531 Weakness: Secondary | ICD-10-CM | POA: Diagnosis not present

## 2021-07-19 DIAGNOSIS — I5022 Chronic systolic (congestive) heart failure: Secondary | ICD-10-CM

## 2021-07-19 DIAGNOSIS — K922 Gastrointestinal hemorrhage, unspecified: Secondary | ICD-10-CM | POA: Diagnosis not present

## 2021-07-19 DIAGNOSIS — G9341 Metabolic encephalopathy: Secondary | ICD-10-CM | POA: Diagnosis not present

## 2021-07-19 LAB — CULTURE, BLOOD (ROUTINE X 2): Culture: NO GROWTH

## 2021-07-19 LAB — BASIC METABOLIC PANEL
Anion gap: 4 — ABNORMAL LOW (ref 5–15)
BUN: 22 mg/dL (ref 8–23)
CO2: 28 mmol/L (ref 22–32)
Calcium: 10.4 mg/dL — ABNORMAL HIGH (ref 8.9–10.3)
Chloride: 107 mmol/L (ref 98–111)
Creatinine, Ser: 1.25 mg/dL — ABNORMAL HIGH (ref 0.61–1.24)
GFR, Estimated: 57 mL/min — ABNORMAL LOW (ref >60)
Glucose, Bld: 138 mg/dL — ABNORMAL HIGH (ref 70–99)
Potassium: 3.3 mmol/L — ABNORMAL LOW (ref 3.5–5.1)
Sodium: 139 mmol/L (ref 135–145)

## 2021-07-19 LAB — CBC
HCT: 28.4 % — ABNORMAL LOW (ref 39.0–52.0)
Hemoglobin: 8.8 g/dL — ABNORMAL LOW (ref 13.0–17.0)
MCH: 28.4 pg (ref 26.0–34.0)
MCHC: 31 g/dL (ref 30.0–36.0)
MCV: 91.6 fL (ref 80.0–100.0)
Platelets: 393 10*3/uL (ref 150–400)
RBC: 3.1 MIL/uL — ABNORMAL LOW (ref 4.22–5.81)
RDW: 14.8 % (ref 11.5–15.5)
WBC: 3.3 10*3/uL — ABNORMAL LOW (ref 4.0–10.5)
nRBC: 0 % (ref 0.0–0.2)

## 2021-07-19 LAB — VITAMIN B1: Vitamin B1 (Thiamine): 92.1 nmol/L (ref 66.5–200.0)

## 2021-07-19 LAB — BLOOD CULTURE ID PANEL (REFLEXED) - BCID2

## 2021-07-19 LAB — SURGICAL PATHOLOGY

## 2021-07-19 LAB — MAGNESIUM: Magnesium: 2.2 mg/dL (ref 1.7–2.4)

## 2021-07-19 MED ORDER — POTASSIUM CHLORIDE IN NACL 20-0.9 MEQ/L-% IV SOLN: INTRAVENOUS | Status: DC

## 2021-07-19 MED ORDER — VANCOMYCIN HCL 1500 MG/300ML IV SOLN
1500.0000 mg | INTRAVENOUS | Status: DC
  Administered 2021-07-19: 1500 mg via INTRAVENOUS
  Filled 2021-07-19: qty 300

## 2021-07-19 MED ORDER — POTASSIUM CHLORIDE CRYS ER 20 MEQ PO TBCR
20.0000 meq | EXTENDED_RELEASE_TABLET | Freq: Once | ORAL | Status: AC
  Administered 2021-07-19: 20 meq via ORAL
  Filled 2021-07-19: qty 1

## 2021-07-19 NOTE — Progress Notes (Addendum)
?  ?       ?PROGRESS NOTE ? ?[name]:[id number] DOB: Jan 21, 1935 DOA: 07/13/2021 ?PCP: Idelle Crouch, MD ? ?Brief History:  ?As per H&P written by Dr. Clearence Ped on 07/14/21 ?Carlos Perkins is a 87 y.o. male with medical history significant of hyperlipidemia, hypertension, myocardial infarction, CHF, paroxysmal atrial fibrillation, and more presents the ED with a chief complaint of generalized weakness.  Patient's family had reported to the ED provider that patient has had a general decline over a couple of months.  He lost his closest living relative 2 months ago, and has been declining since then.  Patient reports that he had a fall 2 weeks ago on Monday.  Since then he thinks he has been getting worse.  He reports he cannot stand up mostly due to weakness, but also pain.  He has sharp pains in his toes when he tries to stand.  He reports the weakness and the pains are equal in the bilateral lower extremities.  He also has sharp pains in his wrist.  His grip strength is significantly decreased because of this.  He reports the symptoms have been present for 2 weeks.  He denies any fatigue.  He reports he is short of breath but its not exertional.  He denies chest pain, hematochezia, melena, fever.  He denies asymmetric weakness, numbness, or hypersensitivity.  He has no other complaints at this time. ?  ?Patient does not smoke, does not drink alcohol, does not use illicit drugs.  He is vaccinated for COVID.  Patient is DNR. ?On 07/18/21 am, patient had altered mental status with increasing lethargy.  Work up was initiated as discussed below. ?   ? ? ?Assessment and Plan: ?* Generalized weakness ?-multifactorial.indluding symptomatic anemia; infection, low B12, electrolyte derangements; deconditioning ?-TSH within normal limits ?-B12 significantly low and will be repleted. ?-Gentle fluid resuscitation has been provided and electrolytes have been repleted. ?-Will continue encouraging him to  have oral intake. ?-No signs of acute infection appreciated ?-CT head without acute intracranial normalities. ?-Ammonia is less than 10 ?-Case discussed with gastroenterology service who has recommended endoscopic evaluation.  Family in agreement. ?-Baseline hemoglobin is 11 ? ?Acute metabolic encephalopathy ?4/19 am--lethargic, but falls asleep without stimulation ?-suspect new infectious process with new fever 102.7 ?B12--130 ?TSH 0.812 ?Ammonia <10 ?-ABG--7.41/46/76/28 2L ?-CT brain--neg for acute findings ?-EKG--afib, RBBB ?-UA/culture--no pyuria ?-blood culture--GPC 1/2 ?-07/19/21--mental status improving--conversant, following commands ? ?SIRS (systemic inflammatory response syndrome) (HCC) ?4/19 am--fever 102.7 with tachypnea ?-check PCT <0.10 ?-lactate--1.5 ?-blood culture GPC one of two ?-CXR--personally reviewed>>L-base opacity ?-CT chest LLL atelectasis with L-effusion ?-UA/culture--no pyuria ?-continue empiric zosyn ?-MRSA screen neg ? ? ?PAF (paroxysmal atrial fibrillation) ([place]) ?-holding coreg due to soft BPs ?-Currently in A-fib, rate controlled ?-Continue telemetry monitoring ?-Holding anticoagulation in the setting of positive fecal occult blood test and need for endoscopic evaluation. ?-EGD/colon unrevealing>>restart apixaban ? ?Symptomatic anemia ?-Hemoglobin baseline around 11  ?-FOBT positive ?-Appreciate assistance and recommendation by GI  ?-4/18 colonoscopy-- 6 polyps in transv & asc colon, 3 polyps in sigmoid and desc colon; nonbleeding internal hemorrhoids ?-4/18 EGD--nonbleeding duodenal diverticulum; +hiatus hernia ?-restart apixaban ? ?Chronic HFrEF (heart failure with reduced ejection fraction) ([place]) ?-holding Lasix, Coreg, ACE and spironolactone as an outpatient. ?-appears clinically euvolemic ?-Continue to follow daily weights and strict I's and O's. ? ?Hypokalemia ?-Potassium 3.4 at time of admission ?-Repleted and within normal limits currently. ?-Continue to follow electrolytes  trend. ?-Presentation of low potassium in the setting  of decreased oral intake and continued use of diuretics. ? ? ?Prostate cancer metastatic to bone [facility]) ?-Continue outpatient follow-up with urology/oncology ?-Currently taking Zytiga, which is on hold at this time. ? ? ?Hypertension ?-Continue Coreg and ACE initially>>hold now as pt cannot take po due to lethargy and soft BPs ?-Continue to follow vital signs. ? ?Hyperlipidemia, unspecified ?-Continue statin ? ? ? ?Family Communication:   daughters updated at bedside 4/20 ? ?Consultants:  palliative ? ?Code Status: DNR ? ?DVT Prophylaxis:  apixaban ? ? ?Procedures: ?As Listed in Progress Note Above ? ?Antibiotics: ?Zosyn 4/19>> ? ? ? ? ? ? ?Subjective: ?Patient denies fevers, chills, headache, chest pain, dyspnea, nausea, vomiting, diarrhea, abdominal pain, dysuria, hematuria, hematochezia, and melena. ? ? ?Objective: ?Vitals:  ? 07/19/21 0446 07/19/21 0524 07/19/21 0536 07/19/21 1204  ?BP:  (!) 82/52 115/77 (!) 95/54  ?Pulse: (!) 49 86 93 68  ?Resp:   16 16  ?Temp:   98.9 ?F (37.2 ?C) 97.7 ?F (36.5 ?C)  ?TempSrc:    Axillary  ?SpO2: 93% 100% 98% 93%  ?Weight:   84.8 kg   ?Height:      ? ? ?Intake/Output Summary (Last 24 hours) at 07/19/2021 1334 ?Last data filed at 07/19/2021 0900 ?Gross per 24 hour  ?Intake 1142.17 ml  ?Output 600 ml  ?Net 542.17 ml  ? ?Weight change: -0.2 kg ?Exam: ? ?General:  Pt is alert, follows commands appropriately, not in acute distress ?HEENT: No icterus, No thrush, No neck mass, [place]/AT ?Cardiovascular: IRRR, S1/S2, no rubs, no gallops ?Respiratory: bibasilar crackles, L>R ?Abdomen: Soft/+BS, non tender, non distended, no guarding ?Extremities: No edema, No lymphangitis, No petechiae, No rashes, no synovitis ? ? ?Data Reviewed: ?I have personally reviewed following labs and imaging studies ?Basic Metabolic Panel: ?Recent Labs  ?Lab 07/13/21 ?2143 07/15/21 ?0500 07/16/21 ?7619 07/17/21 ?5093 07/18/21 ?2671 07/19/21 ?2458  ?NA 132* 134* 133*  136 136 139  ?K 3.4* 4.3 4.2 3.7 3.8 3.3*  ?CL 102 105 104 104 104 107  ?CO2 23 23 21* '25 26 28  '$ ?GLUCOSE 123* 99 117* 111* 126* 138*  ?BUN '15 15 19 20 23 22  '$ ?CREATININE 1.05 1.00 1.07 1.01 1.08 1.25*  ?CALCIUM 10.0 9.9 9.9 10.3 10.2 10.4*  ?MG 2.0 1.8  --   --  2.1 2.2  ? ?Liver Function Tests: ?Recent Labs  ?Lab 07/15/21 ?0500 07/18/21 ?0926  ?AST 18 81*  ?ALT 14 57*  ?ALKPHOS 50 87  ?BILITOT 0.9 0.9  ?PROT 5.7* 6.1*  ?ALBUMIN 2.4* 2.4*  ? ?No results for input(s): LIPASE, AMYLASE in the last 168 hours. ?Recent Labs  ?Lab 07/13/21 ?0998  ?AMMONIA <10  ? ?Coagulation Profile: ?No results for input(s): INR, PROTIME in the last 168 hours. ?CBC: ?Recent Labs  ?Lab 07/15/21 ?0500 07/16/21 ?3382 07/17/21 ?5053 07/18/21 ?9767 07/19/21 ?3419  ?WBC 6.9 8.0 5.7 4.6 3.3*  ?NEUTROABS 4.8  --   --   --   --   ?HGB 9.0* 8.7* 9.0* 9.2* 8.8*  ?HCT 28.1* 27.7* 27.1* 29.8* 28.4*  ?MCV 87.8 89.1 87.7 89.2 91.6  ?PLT 358 333 411* 450* 393  ? ?Cardiac Enzymes: ?No results for input(s): CKTOTAL, CKMB, CKMBINDEX, TROPONINI in the last 168 hours. ?BNP: ?Invalid input(s): POCBNP ?CBG: ?Recent Labs  ?Lab 07/16/21 ?2109 07/17/21 ?3790 07/17/21 ?2409 07/17/21 ?2109 07/18/21 ?1019  ?GLUCAP 122* 107* 109* 155* 110*  ? ?HbA1C: ?No results for input(s): HGBA1C in the last 72 hours. ?Urine analysis: ?   ?Component Value  Date/Time  ? [place] YELLOW 07/18/2021 1815  ? APPEARANCEUR CLOUDY (A) 07/18/2021 1815  ? LABSPEC 1.014 07/18/2021 1815  ? PHURINE 6.0 07/18/2021 1815  ? GLUCOSEU NEGATIVE 07/18/2021 1815  ? HGBUR SMALL (A) 07/18/2021 1815  ? [place] NEGATIVE 07/18/2021 1815  ? [place] NEGATIVE 07/18/2021 1815  ? PROTEINUR 30 (A) 07/18/2021 1815  ? NITRITE NEGATIVE 07/18/2021 1815  ? LEUKOCYTESUR NEGATIVE 07/18/2021 1815  ? ?Sepsis Labs: ?'@LABRCNTIP'$ (procalcitonin:4,lacticidven:4) ?) ?Recent Results (from the past 240 hour(s))  ?Blood culture (routine x 2)     Status: None  ? Collection Time: 07/13/21  9:43 PM  ? Specimen: BLOOD RIGHT  FOREARM  ?Result Value Ref Range Status  ? Specimen Description   Final  ?  BLOOD RIGHT FOREARM BOTTLES DRAWN AEROBIC AND ANAEROBIC  ? Special Requests Blood Culture adequate volume  Final  ? Culture   [place]

## 2021-07-19 NOTE — Progress Notes (Addendum)
?   07/19/21 0036  ?Assess: MEWS Score  ?Temp (!) 101.1 ?F (38.4 ?C)  ?Assess: MEWS Score  ?MEWS Temp 1  ?MEWS Systolic 0  ?MEWS Pulse 1  ?MEWS RR 0  ?MEWS LOC 1  ?MEWS Score 3  ?MEWS Score Color Yellow  ?Assess: if the MEWS score is Yellow or Red  ?Were vital signs taken at a resting state? Yes  ?Focused Assessment Change from prior assessment (see assessment flowsheet)  ?Early Detection of Sepsis Score *See Row Information* Medium  ?MEWS guidelines implemented *See Row Information* Yes  ?Treat  ?MEWS Interventions Administered scheduled meds/treatments  ?Pain Scale 0-10  ?Pain Score 0  ?Take Vital Signs  ?Increase Vital Sign Frequency  Yellow: Q 2hr X 2 then Q 4hr X 2, if remains yellow, continue Q 4hrs  ?Escalate  ?MEWS: Escalate Yellow: discuss with charge nurse/RN and consider discussing with provider and RRT  ?Notify: Charge Nurse/RN  ?Name of Charge Nurse/RN Notified Otila Kluver, RN  ?Date Charge Nurse/RN Notified 07/19/21  ?Time Charge Nurse/RN Notified [phone]  ?Notify: Provider  ?Provider Name/Title Adefeso  ?Date Provider Notified 07/19/21  ?Time Provider Notified [phone]  ?Notification Type  ?(Secure chat)  ?Notification Reason Change in status  ? ?Temperature 98.5 orally. Resting with family at  bedside. ?

## 2021-07-19 NOTE — Progress Notes (Addendum)
PHARMACY - PHYSICIAN COMMUNICATION ?CRITICAL VALUE ALERT - BLOOD CULTURE IDENTIFICATION (BCID) ? ?Carlos Perkins is an 87 y.o. male who presented to [facility] on 07/13/2021 with a chief complaint of generalized weakness ? ?Assessment:  87 y.o. male with medical history significant of hyperlipidemia, hypertension, myocardial infarction, CHF, paroxysmal atrial fibrillation, and more presents the ED with a chief complaint of generalized weakness. Febrile 4/19 with tachypnea. PCT <0.1 . BCID + staph epi in 3 bottles  ? ?Name of physician (or Provider) Contacted: Dr. Carles Collet ? ?Current antibiotics: zosyn ? ?Changes to prescribed antibiotics recommended:  ?Recommended adding vancomycin to cover the resistant staph epi ?Vancomycin '1500mg'$  IV for AUC 510 ? ?Results for orders placed or performed during the hospital encounter of 07/13/21  ?Blood Culture ID Panel (Reflexed) (Collected: 07/18/2021 11:04 AM)  ?Result Value Ref Range  ? Enterococcus faecalis NOT DETECTED NOT DETECTED  ? Enterococcus Faecium NOT DETECTED NOT DETECTED  ? Listeria monocytogenes NOT DETECTED NOT DETECTED  ? Staphylococcus species DETECTED (A) NOT DETECTED  ? Staphylococcus aureus (BCID) NOT DETECTED NOT DETECTED  ? Staphylococcus epidermidis DETECTED (A) NOT DETECTED  ? Staphylococcus lugdunensis NOT DETECTED NOT DETECTED  ? Streptococcus species NOT DETECTED NOT DETECTED  ? Streptococcus agalactiae NOT DETECTED NOT DETECTED  ? Streptococcus pneumoniae NOT DETECTED NOT DETECTED  ? Streptococcus pyogenes NOT DETECTED NOT DETECTED  ? A.calcoaceticus-baumannii NOT DETECTED NOT DETECTED  ? Bacteroides fragilis NOT DETECTED NOT DETECTED  ? Enterobacterales NOT DETECTED NOT DETECTED  ? Enterobacter cloacae complex NOT DETECTED NOT DETECTED  ? Escherichia coli NOT DETECTED NOT DETECTED  ? Klebsiella aerogenes NOT DETECTED NOT DETECTED  ? Klebsiella oxytoca NOT DETECTED NOT DETECTED  ? Klebsiella pneumoniae NOT DETECTED NOT DETECTED  ? Proteus species NOT  DETECTED NOT DETECTED  ? Salmonella species NOT DETECTED NOT DETECTED  ? Serratia marcescens NOT DETECTED NOT DETECTED  ? Haemophilus influenzae NOT DETECTED NOT DETECTED  ? Neisseria meningitidis NOT DETECTED NOT DETECTED  ? Pseudomonas aeruginosa NOT DETECTED NOT DETECTED  ? Stenotrophomonas maltophilia NOT DETECTED NOT DETECTED  ? Candida albicans NOT DETECTED NOT DETECTED  ? Candida auris NOT DETECTED NOT DETECTED  ? Candida glabrata NOT DETECTED NOT DETECTED  ? Candida krusei NOT DETECTED NOT DETECTED  ? Candida parapsilosis NOT DETECTED NOT DETECTED  ? Candida tropicalis NOT DETECTED NOT DETECTED  ? Cryptococcus neoformans/gattii NOT DETECTED NOT DETECTED  ? Methicillin resistance mecA/C DETECTED (A) NOT DETECTED  ? ? ?Isac Sarna L ?07/19/2021  5:00 PM ? ?

## 2021-07-19 NOTE — Progress Notes (Signed)
Lab called patients anaerobic bottle came back with gram positive cocci. MD Tat made aware. ? ?

## 2021-07-19 NOTE — Progress Notes (Signed)
Palliative: ? ?HPI: 87 y.o. male  with past medical history of hyperlipidemia, hypertension, myocardial infarction, HFrEF, paroxysmal atrial fibrillation, prostate cancer with bone mets (on Zytiga, Lupron; follows with Dr. Janese Banks [facility]) admitted on 07/13/2021 with weakness and pain in the setting of general decline over past few months after the loss of 2 close relatives. Being treated for symptomatic anemia, infection/SIRS, low B12, electrolyte derangements. S/P colonoscopy/EGD 4/18 with findings of multiple polyps, non-bleeding internal hemorrhoids, non-bleeding duodenal diverticulum, hiatal hernia. Anticoagulation on hold due to anemia.  ? ?I met today with Mr. Mathews. Family remains at bedside. He has much improved mental status and is interacting with his family at bedside. He reports that he is feeling much better. Family at bedside are pleased with his progress and hopeful for continued improvement. They have no questions/concerns at this time. Emotional support provided.  ? ?No charge ? ?Vinie Sill, NP ?Palliative Medicine Team ?Pager [phone] (Please see [email or web address] for schedule) ?Team Phone [phone]  ? ?

## 2021-07-19 NOTE — Assessment & Plan Note (Addendum)
-  holding Lasix, Coreg, ACE and spironolactone as an outpatient>>resume after d/c ?-appears clinically euvolemic ?-Continue to follow daily weights and strict I's and O's. ?07/23/21 EF 40%, global HK, RVSP 50.0, mild MR/TR ?

## 2021-07-19 NOTE — Progress Notes (Signed)
Physical Therapy Treatment ?Patient Details ?Name: Carlos Perkins ?MRN: [id number] ?DOB: 08-14-1934 ?Today's Date: 07/19/2021 ? ? ?History of Present Illness Nahum Quinterius Gaida is a 87 y.o. male with medical history significant of hyperlipidemia, hypertension, myocardial infarction, CHF, paroxysmal atrial fibrillation, and more presents the ED with a chief complaint of generalized weakness.  Patient's family had reported to the ED provider that patient has had a general decline over a couple of months.  He lost his closest living relative 2 months ago, and has been declining since then.  Patient reports that he had a fall 2 weeks ago on Monday.  Since then he thinks he has been getting worse.  He reports he cannot stand up mostly due to weakness, but also pain.  He has sharp pains in his toes when he tries to stand.  He reports the weakness and the pains are equal in the bilateral lower extremities.  He also has sharp pains in his wrist.  His grip strength is significantly decreased because of this.  He reports the symptoms have been present for 2 weeks.  He denies any fatigue.  He reports he is short of breath but its not exertional.  He denies chest pain, hematochezia, melena, fever.  He denies asymmetric weakness, numbness, or hypersensitivity.  He has no other complaints at this time. ? ?  ?PT Comments  ? ? Patient more alert for treatment today. Patient requires mod assist for bed mobility and max assist for sit to stand transfers. Patient was unable to maintain a seated position. Patient was unable to perform lateral scoots up the bedside. Patient performed seated exercises at bedside and was assisted to lying in supine. Bed was placed in Trendelenburg position and patient was able to use arms and legs to boost up in bed with verbal cues only. Patient was then positioned in bed. Family waiting in hallway was informed of PT session progression.  Patient would continue to benefit from skilled physical  therapy in current environment and next venue to continue return to prior function and increase strength, endurance, balance, coordination, and functional mobility and gait skills.  ?   ?Recommendations for follow up therapy are one component of a multi-disciplinary discharge planning process, led by the attending physician.  Recommendations may be updated based on patient status, additional functional criteria and insurance authorization. ? ?Follow Up Recommendations ? Acute inpatient rehab (3hours/day) ?  ?  ?Assistance Recommended at Discharge Intermittent Supervision/Assistance  ?Patient can return home with the following A lot of help with walking and/or transfers;A lot of help with bathing/dressing/bathroom;Assistance with cooking/housework;Help with stairs or ramp for entrance ?  ?Equipment Recommendations ? None recommended by PT  ?  ?Recommendations for Other Services   ? ? ?  ?Precautions / Restrictions Precautions ?Precautions: Fall ?Restrictions ?Weight Bearing Restrictions: No  ?  ? ?Mobility ? Bed Mobility ?Overal bed mobility: Needs Assistance ?Bed Mobility: Supine to Sit, Sit to Supine ?  ?  ?Supine to sit: Mod assist, HOB elevated ?Sit to supine: Mod assist ?  ?General bed mobility comments: slow, labored, frequent cueing, assist to upright trunk and for final LE movement to EOB ?  ? ?Transfers ?Overall transfer level: Needs assistance ?Equipment used: Rolling walker (2 wheels) ?Transfers: Sit to/from Stand ?Sit to Stand: Max assist, From elevated surface ?  ?  ?General transfer comment: cues for sequencing of steps and placement of hands; bilateral knees buckling ?  ? ?Ambulation/Gait ?  ?  ?General Gait Details: unable ? ? ?  Stairs ?  ?  ? ? ?Wheelchair Mobility ?  ? ?Modified Rankin (Stroke Patients Only) ?  ? ? ?  ?Balance Overall balance assessment: Needs assistance ?Sitting-balance support: Feet supported, Bilateral upper extremity supported ?Sitting balance-Leahy Scale: Fair ?  ?  ?Standing  balance support: Bilateral upper extremity supported, During functional activity ?Standing balance-Leahy Scale: Zero ?  ? ?  ?Cognition Arousal/Alertness: Awake/alert ?Behavior During Therapy: [facility] for tasks assessed/performed ?Overall Cognitive Status: Within Functional Limits for tasks assessed ?  ?  ? ?  ?Exercises General Exercises - Lower Extremity ?Long Arc Quad: AROM, Strengthening, Both, 10 reps, Seated ?Hip Flexion/Marching: AROM, Strengthening, Both, 5 reps, Seated ?Toe Raises: AROM, Strengthening, Both, 10 reps, Seated ?Heel Raises: AROM, Strengthening, Both, 10 reps, Seated ? ?  ?General Comments   ?  ?  ? ?Pertinent Vitals/Pain Pain Assessment ?Pain Assessment: No/denies pain  ? ? ?Home Living   ?  ?  ?  ?  ?Prior Function    ?   ? ?PT Goals (current goals can now be found in the care plan section) Acute Rehab PT Goals ?Patient Stated Goal: Return home ?PT Goal Formulation: With patient/family ?Time For Goal Achievement: 07/28/21 ?Potential to Achieve Goals: Fair ?Progress towards PT goals: Progressing toward goals ? ?  ?Frequency ? ? ? Min 4X/week ? ? ? ?  ?PT Plan Current plan remains appropriate  ? ? ?   ?AM-PAC PT "6 Clicks" Mobility   ?Outcome Measure ? Help needed turning from your back to your side while in a flat bed without using bedrails?: A Little ?Help needed moving from lying on your back to sitting on the side of a flat bed without using bedrails?: A Lot ?Help needed moving to and from a bed to a chair (including a wheelchair)?: A Lot ?Help needed standing up from a chair using your arms (e.g., wheelchair or bedside chair)?: A Lot ?Help needed to walk in hospital room?: Total ?Help needed climbing 3-5 steps with a railing? : Total ?6 Click Score: 11 ? ?  ?End of Session Equipment Utilized During Treatment: Gait belt ?Activity Tolerance: Patient tolerated treatment well;Patient limited by fatigue ?Patient left: in bed;with call bell/phone within reach;with family/visitor present ?Nurse  Communication: Mobility status ?PT Visit Diagnosis: Unsteadiness on feet (R26.81);Other abnormalities of gait and mobility (R26.89);History of falling (Z91.81);Muscle weakness (generalized) (M62.81) ?  ? ? ?Time: 0454-0981 ?PT Time Calculation (min) (ACUTE ONLY): 40 min ? ?Charges:  $Therapeutic Exercise: 8-22 mins ?$Therapeutic Activity: 8-22 mins          ?          ? ?Floria Raveling. Hartnett-Rands, MS, PT ?Per [place] ?[place] #[id number] ? ?Gracianna Vink  Hartnett-Rands ?07/19/2021, 11:22 AM ? ?

## 2021-07-19 NOTE — Progress Notes (Signed)
Tele called patient had a pause of 2.53. Noted patient resting in bed. MD Tat made aware.  ?

## 2021-07-20 DIAGNOSIS — I5022 Chronic systolic (congestive) heart failure: Secondary | ICD-10-CM | POA: Diagnosis not present

## 2021-07-20 DIAGNOSIS — R531 Weakness: Secondary | ICD-10-CM | POA: Diagnosis not present

## 2021-07-20 DIAGNOSIS — K922 Gastrointestinal hemorrhage, unspecified: Secondary | ICD-10-CM | POA: Diagnosis not present

## 2021-07-20 DIAGNOSIS — G9341 Metabolic encephalopathy: Secondary | ICD-10-CM | POA: Diagnosis not present

## 2021-07-20 LAB — COMPREHENSIVE METABOLIC PANEL
ALT: 41 U/L (ref 0–44)
AST: 36 U/L (ref 15–41)
Albumin: 2.1 g/dL — ABNORMAL LOW (ref 3.5–5.0)
Alkaline Phosphatase: 72 U/L (ref 38–126)
Anion gap: 5 (ref 5–15)
BUN: 17 mg/dL (ref 8–23)
CO2: 29 mmol/L (ref 22–32)
Calcium: 10.9 mg/dL — ABNORMAL HIGH (ref 8.9–10.3)
Chloride: 108 mmol/L (ref 98–111)
Creatinine, Ser: 0.9 mg/dL (ref 0.61–1.24)
GFR, Estimated: >60 mL/min (ref >60)
Glucose, Bld: 110 mg/dL — ABNORMAL HIGH (ref 70–99)
Potassium: 4.4 mmol/L (ref 3.5–5.1)
Sodium: 142 mmol/L (ref 135–145)
Total Bilirubin: 0.3 mg/dL (ref 0.3–1.2)
Total Protein: 5.5 g/dL — ABNORMAL LOW (ref 6.5–8.1)

## 2021-07-20 LAB — CBC
HCT: 28.5 % — ABNORMAL LOW (ref 39.0–52.0)
Hemoglobin: 8.6 g/dL — ABNORMAL LOW (ref 13.0–17.0)
MCH: 27.7 pg (ref 26.0–34.0)
MCHC: 30.2 g/dL (ref 30.0–36.0)
MCV: 91.9 fL (ref 80.0–100.0)
Platelets: 411 10*3/uL — ABNORMAL HIGH (ref 150–400)
RBC: 3.1 MIL/uL — ABNORMAL LOW (ref 4.22–5.81)
RDW: 14.7 % (ref 11.5–15.5)
WBC: 5.4 10*3/uL (ref 4.0–10.5)
nRBC: 0 % (ref 0.0–0.2)

## 2021-07-20 LAB — FOLATE: Folate: 10.1 ng/mL (ref >5.9)

## 2021-07-20 LAB — MAGNESIUM: Magnesium: 2.1 mg/dL (ref 1.7–2.4)

## 2021-07-20 MED ORDER — SODIUM CHLORIDE 0.9 % IV SOLN
60.0000 mg | Freq: Once | INTRAVENOUS | Status: AC
  Administered 2021-07-20: 60 mg via INTRAVENOUS
  Filled 2021-07-20: qty 20

## 2021-07-20 MED ORDER — FOLIC ACID 1 MG PO TABS
1.0000 mg | ORAL_TABLET | Freq: Every day | ORAL | Status: DC
  Administered 2021-07-20 – 2021-07-24 (×4): 1 mg via ORAL
  Filled 2021-07-20 (×4): qty 1

## 2021-07-20 NOTE — Progress Notes (Signed)
Id/asp virtual note ? ?Cc -- bcx hospital onset bacteremia ? ? ?Reviewed notes ?87 yo male p-af, HFrEF admitted 4/14 for generalized weakness/failure to thrive and developed decreased mentation on 4/19 with fever so bcx done and started on empiric abx ? ? ?4/19-c piptazo ?4/20-c vanc ? ? ?No leukocytosis; elevated platelet in setting anemia ?Procal <0.1 but unreliable in non-CAP setting ?Lft normal ?Cr normal ?Calcium is slightly elevated ? ? ?Ucx sent ?Bcx 4/19 1 set with staph hominis and 1 set with staph epi ?4/14 bcx negative ? ?A/p ?CoNS likely contaminant  ?Sepsis hospital onset awaiting workup ? ? ? ?-repeat blood cx ?-continue piptazo for empiric sepsis coverage while waiting for w/u ?-reasonable to stop vanc now if no evidence of indwellign catheter/cardiac device presence/or prior heart valve replacement as the CoNS is contaminant ?-other sepsis w/u per team ?

## 2021-07-20 NOTE — Assessment & Plan Note (Addendum)
Due to prostate cancer with mets to bone ?4/21 corrected calcium = 12.4>>11.6>>10.9 ?Give one dose bisphosphonate 4/21 ?Continue IVF ?

## 2021-07-20 NOTE — Progress Notes (Signed)
? ?Gastroenterology Progress Note  ? ?Referring Provider: Eloise Harman, DO ?Primary Care Physician:  Idelle Crouch, MD ?Primary Gastroenterologist:  Dr. Abbey Chatters ? ?Patient ID: Carlos Perkins; [id number]; 1934/08/03  ? ? ?Subjective  ? ?Family at bedside and states that the patient had a great day yesterday. He was much more alert, talking, telling stories and had a great appetite, stating that he could not get enough to eat. They also stated that he was very flirty with staff and that is a side they have not seen in quite some time. They report that this morning he has had waxing and waning as far as his mental status goes. He ate over half of his meal with no issue. Family stated no issue swallowing pills. He has not yet had a bowel movement since EGD/colonoscopy. Patient denies any abdominal pain, N/V. ? ? ?Objective  ? ?Vital signs in last 24 hours ?Temp:  [97.7 ?F (36.5 ?C)-98.1 ?F (36.7 ?C)] 98.1 ?F (36.7 ?C) (04/21 0501) ?Pulse Rate:  [63-132] 83 (04/21 0501) ?Resp:  [16-17] 17 (04/20 2011) ?BP: (95-125)/(54-80) 125/80 (04/21 0501) ?SpO2:  [93 %-100 %] 93 % (04/21 0501) ?Weight:  [88.4 kg] 88.4 kg (04/21 0501) ?Last BM Date : 07/17/21 ? ?Physical Exam ?General:   Drowsy, pleasant ?Head:  Normocephalic and atraumatic. ?Eyes:  No icterus, sclera clear. Conjuctiva pink.  ?Mouth:  Without lesions, mucosa pink and moist.  ?Neck:  Supple, without thyromegaly or masses.  ?Heart:  S1, S2 present, no murmurs noted.  ?Lungs: Clear to auscultation bilaterally, without wheezing, rales, or rhonchi.  ?Abdomen:  Bowel sounds present, soft, non-tender, non-distended. No HSM or hernias noted. No rebound or guarding. No masses appreciated  ?Msk:  Symmetrical without gross deformities. Normal posture. ?Pulses:  Normal pulses noted. ?Extremities:  Without clubbing or edema. ?Neurologic:  Alert and  oriented x4;  grossly normal neurologically. ?Skin:  Warm and dry, intact without significant lesions.  ?Cervical Nodes:   No significant cervical adenopathy. ?Psych:  Alert and cooperative. Normal mood and affect. ? ?Intake/Output from previous day: ?04/20 0701 - 04/21 0700 ?In: 1123.4 [P.O.:480; I.V.:446.6; IV Piggyback:196.8] ?Out: 600 [Urine:600] ?Intake/Output this shift: ?No intake/output data recorded. ? ?Lab Results ? ?Recent Labs  ?  07/18/21 ?0926 07/19/21 ?9562 07/20/21 ?0527  ?WBC 4.6 3.3* 5.4  ?HGB 9.2* 8.8* 8.6*  ?HCT 29.8* 28.4* 28.5*  ?PLT 450* 393 411*  ? ?BMET ?Recent Labs  ?  07/18/21 ?0926 07/19/21 ?1308 07/20/21 ?0527  ?NA 136 139 142  ?K 3.8 3.3* 4.4  ?CL 104 107 108  ?CO2 '26 28 29  '$ ?GLUCOSE 126* 138* 110*  ?BUN '23 22 17  '$ ?CREATININE 1.08 1.25* 0.90  ?CALCIUM 10.2 10.4* 10.9*  ? ?LFT ?Recent Labs  ?  07/18/21 ?0926 07/20/21 ?0527  ?PROT 6.1* 5.5*  ?ALBUMIN 2.4* 2.1*  ?AST 81* 36  ?ALT 57* 41  ?ALKPHOS 87 72  ?BILITOT 0.9 0.3  ? ?PT/INR ?No results for input(s): LABPROT, INR in the last 72 hours. ?Hepatitis Panel ?No results for input(s): HEPBSAG, HCVAB, HEPAIGM, HEPBIGM in the last 72 hours. ? ? ?Studies/Results ?DG Wrist Complete Left ? ?Result Date: 07/14/2021 ?CLINICAL DATA:  Generalized weakness since being discharged from [facility] 1 week ago. Also complains of bilateral wrist swelling and soreness. EXAM: LEFT WRIST - COMPLETE 3+ VIEW COMPARISON:  None. FINDINGS: There is no evidence of an acute fracture or dislocation. A chronic fracture deformity is seen involving the distal shaft of the left radius. An additional  chronic fracture of the left ulnar styloid is noted. Marked severity degenerative changes are noted throughout the left wrist with a chronic deformity is seen involving the left scaphoid bone. There is mild diffuse soft tissue swelling. IMPRESSION: 1. Marked severity degenerative changes without an acute fracture or dislocation. 2. Chronic fractures of the distal left radius and left ulnar styloid. Electronically Signed   By: Virgina Norfolk M.D.   On: 07/14/2021 00:21  ? ?DG Wrist Complete  Right ? ?Result Date: 07/14/2021 ?CLINICAL DATA:  Generalized weakness since being discharged from [facility] 1 week ago with complaints of bilateral wrist swelling and soreness. EXAM: RIGHT WRIST - COMPLETE 3+ VIEW COMPARISON:  None. FINDINGS: There is no evidence of fracture or dislocation. Mild degenerative changes are seen along the radiocarpal joint and along the carpal metacarpal articulation of the right thumb. Mild diffuse soft tissue swelling is seen. IMPRESSION: No acute osseous abnormality. Electronically Signed   By: Virgina Norfolk M.D.   On: 07/14/2021 00:23  ? ?CT HEAD WO CONTRAST (5MM) ? ?Result Date: 07/18/2021 ?CLINICAL DATA:  Mental status change, unknown cause EXAM: CT HEAD WITHOUT CONTRAST TECHNIQUE: Contiguous axial images were obtained from the base of the skull through the vertex without intravenous contrast. RADIATION DOSE REDUCTION: This exam was performed according to the departmental dose-optimization program which includes automated exposure control, adjustment of the mA and/or kV according to patient size and/or use of iterative reconstruction technique. COMPARISON:  07/14/2021 FINDINGS: Brain: There is no acute intracranial hemorrhage, mass effect, or edema. Gray-white differentiation is preserved. There is no extra-axial fluid collection. Ventricles and sulci are stable in size and configuration. Patchy low-density in the supratentorial white matter is nonspecific but probably reflects stable chronic microvascular ischemic changes. Vascular: There is atherosclerotic calcification at the skull base. Skull: Calvarium is unremarkable. Sinuses/Orbits: No acute finding. Other: None. IMPRESSION: No acute intracranial abnormality or significant change since recent prior study. Electronically Signed   By: Macy Mis M.D.   On: 07/18/2021 13:30  ? ?CT Head Wo Contrast ? ?Result Date: 07/14/2021 ?CLINICAL DATA:  Generalized weakness since being discharged from [facility] 1 week ago.  EXAM: CT HEAD WITHOUT CONTRAST TECHNIQUE: Contiguous axial images were obtained from the base of the skull through the vertex without intravenous contrast. RADIATION DOSE REDUCTION: This exam was performed according to the departmental dose-optimization program which includes automated exposure control, adjustment of the mA and/or kV according to patient size and/or use of iterative reconstruction technique. COMPARISON:  January 07, 2020 FINDINGS: Brain: No evidence of acute infarction, hemorrhage, hydrocephalus, extra-axial collection or mass lesion/mass effect. Vascular: No hyperdense vessel or unexpected calcification. Skull: Normal. Negative for fracture or focal lesion. Sinuses/Orbits: No acute finding. Other: None. IMPRESSION: 1. No acute intracranial process. 2. Generalized cerebral atrophy with chronic white matter small vessel ischemic changes. Electronically Signed   By: Virgina Norfolk M.D.   On: 07/14/2021 01:51  ? ?CT CHEST WO CONTRAST ? ?Result Date: 07/18/2021 ?CLINICAL DATA:  Chronic short of breath.  New oxygen requirement EXAM: CT CHEST WITHOUT CONTRAST TECHNIQUE: Multidetector CT imaging of the chest was performed following the standard protocol without IV contrast. RADIATION DOSE REDUCTION: This exam was performed according to the departmental dose-optimization program which includes automated exposure control, adjustment of the mA and/or kV according to patient size and/or use of iterative reconstruction technique. COMPARISON:  Chest CT '5 31 22 '$ FINDINGS: Cardiovascular: Coronary artery calcification and aortic atherosclerotic calcification. Mediastinum/Nodes: No axillary or supraclavicular adenopathy. No mediastinal or  hilar adenopathy. No pericardial fluid. Esophagus normal. Lungs/Pleura: LEFT lower lobe atelectasis and small effusion. No focal consolidation. No pulmonary edema. No suspicious nodularity. Upper Abdomen: Limited view of the liver, kidneys, pancreas are unremarkable. Normal  adrenal glands. Musculoskeletal: No aggressive osseous lesion. IMPRESSION: 1. LEFT pleural effusion and LEFT basilar atelectasis. 2. No pneumonia.  No interstitial lung disease identified Electronically Signe

## 2021-07-20 NOTE — Progress Notes (Signed)
?  ?       ?PROGRESS NOTE ? ?Carlos Perkins QMV:[id number] DOB: [birth year] DOA: 07/13/2021 ?PCP: Idelle Crouch, MD ? ?Brief History:  ?As per H&P written by Dr. Clearence Ped on 07/14/21 ?Carlos Perkins is a 87 y.o. male with medical history significant of hyperlipidemia, hypertension, myocardial infarction, CHF, paroxysmal atrial fibrillation, and more presents the ED with a chief complaint of generalized weakness.  Patient's family had reported to the ED provider that patient has had a general decline over a couple of months.  He lost his closest living relative 2 months ago, and has been declining since then.  Patient reports that he had a fall 2 weeks ago on Monday.  Since then he thinks he has been getting worse.  He reports he cannot stand up mostly due to weakness, but also pain.  He has sharp pains in his toes when he tries to stand.  He reports the weakness and the pains are equal in the bilateral lower extremities.  He also has sharp pains in his wrist.  His grip strength is significantly decreased because of this.  He reports the symptoms have been present for 2 weeks.  He denies any fatigue.  He reports he is short of breath but its not exertional.  He denies chest pain, hematochezia, melena, fever.  He denies asymmetric weakness, numbness, or hypersensitivity.  He has no other complaints at this time. ?  ?Patient does not smoke, does not drink alcohol, does not use illicit drugs.  He is vaccinated for COVID.  Patient is DNR. ?On 07/18/21 am, patient had altered mental status with increasing lethargy.  Work up was initiated as discussed below. ?   ? ? ?Assessment and Plan: ?* Generalized weakness ?-multifactorial.indluding symptomatic anemia; infection, low B12, electrolyte derangements; deconditioning ?-TSH within normal limits ?-B12 significantly low and will be repleted. ?-Gentle fluid resuscitation has been provided and electrolytes have been repleted. ?-Will continue encouraging him to  have oral intake. ?-No signs of acute infection appreciated ?-CT head without acute intracranial normalities. ?-Ammonia is less than 10 ?-Case discussed with gastroenterology service who has recommended endoscopic evaluation.  Family in agreement. ?-Baseline hemoglobin is 11 ? ?Acute metabolic encephalopathy ?4/19 am--lethargic, but falls asleep without stimulation ?-suspect new infectious process and hypercalcemia ?4/21 corrected calcium 11.7 ?B12--130 ?Folate 10.1 ?TSH 0.812 ?Ammonia <10 ?-ABG--7.41/46/76/28 2L ?-CT brain--neg for acute findings ?-EKG--afib, RBBB ?-UA/culture--no pyuria ?-blood culture--GPC 1/2 ?-07/19/21--mental status improving--conversant, following commands ?-increase IVF ? ?SIRS (systemic inflammatory response syndrome) (HCC) ?4/19 am--fever 102.7 with tachypnea ?-check PCT <0.10 ?-lactate--1.5 ?-blood culture GPC one of two ?-CXR--personally reviewed>>L-base opacity ?-CT chest LLL atelectasis with L-effusion ?-UA/culture--no pyuria ?-continue empiric zosyn ?-MRSA screen neg ? ? ?PAF (paroxysmal atrial fibrillation) ([place]) ?-holding coreg due to soft BPs ?-Currently in A-fib, rate controlled ?-Continue telemetry monitoring ?-Holding anticoagulation in the setting of positive fecal occult blood test and need for endoscopic evaluation. ?-EGD/colon unrevealing>>restart apixaban when ok with GI ? ?Symptomatic anemia ?-Hemoglobin baseline around 11  ?-FOBT positive ?-Appreciate assistance and recommendation by GI  ?-4/18 colonoscopy-- 6 polyps in transv & asc colon, 3 polyps in sigmoid and desc colon; nonbleeding internal hemorrhoids ?-4/18 EGD--nonbleeding duodenal diverticulum; +hiatus hernia ?-restart apixaban when ok with GI ? ?Chronic HFrEF (heart failure with reduced ejection fraction) ([place]) ?-holding Lasix, Coreg, ACE and spironolactone as an outpatient. ?-appears clinically euvolemic ?-Continue to follow daily weights and strict I's and O's. ? ?Hypokalemia ?-Potassium 3.4 at time of  admission ?-Repleted and within normal  limits currently. ?-Continue to follow electrolytes trend. ?-Presentation of low potassium in the setting of decreased oral intake and continued use of diuretics. ? ? ?Goals of care, counseling/discussion ?Family confirms DNR ?Treat the treatable for now, plan to take home with hospice once stable ? ?Prostate cancer metastatic to bone [facility]) ?-Continue outpatient follow-up with urology/oncology ?-Currently taking Zytiga, which is on hold at this time. ? ? ?Hypertension ?-Continue Coreg and ACE initially>>hold now as pt cannot take po due to lethargy and soft BPs ?-Continue to follow vital signs. ? ?Hyperlipidemia, unspecified ?-Continue statin ? ?Hypercalcemia ?Due to prostate cancer with mets to bone ?4/21 corrected calcium = 11.7 ?Give one dose bisphosphonate ? ? ? ? ? ? ?Family Communication:   Family at bedside updated 4/21 ? ?Consultants:  palliative ? ?Code Status:  DNR ? ?DVT Prophylaxis:  apixaban ? ? ?Procedures: ?As Listed in Progress Note Above ? ?Antibiotics: ?Zosyn 4/19>> ? ? ? ? ?Subjective: ?Patient denies fevers, chills, headache, chest pain, dyspnea, nausea, vomiting, diarrhea, abdominal pain ? ? ?Objective: ?Vitals:  ? 07/19/21 2011 07/19/21 2015 07/20/21 0501 07/20/21 1343  ?BP: 100/60 96/72 125/80 95/78  ?Pulse: (!) 132 63 83 86  ?Resp: 17   20  ?Temp: 97.8 ?F (36.6 ?C)  98.1 ?F (36.7 ?C) 98.2 ?F (36.8 ?C)  ?TempSrc: Oral  Oral Oral  ?SpO2: 100% 100% 93% 97%  ?Weight:   88.4 kg   ?Height:      ? ? ?Intake/Output Summary (Last 24 hours) at 07/20/2021 1828 ?Last data filed at 07/20/2021 1700 ?Gross per 24 hour  ?Intake 120 ml  ?Output 1125 ml  ?Net -1005 ml  ? ?Weight change: 3.6 kg ?Exam: ? ?General:  Pt is alert, follows commands appropriately, not in acute distress ?HEENT: No icterus, No thrush, No neck mass, [place]/AT ?Cardiovascular: RRR, S1/S2, no rubs, no gallops ?Respiratory: bibasilar rales. No wheeze ?Abdomen: Soft/+BS, non tender, non distended, no  guarding ?Extremities: No edema, No lymphangitis, No petechiae, No rashes, no synovitis ? ? ?Data Reviewed: ?I have personally reviewed following labs and imaging studies ?Basic Metabolic Panel: ?Recent Labs  ?Lab 07/13/21 ?2143 07/15/21 ?0500 07/16/21 ?0737 07/17/21 ?1062 07/18/21 ?6948 07/19/21 ?5462 07/20/21 ?0527  ?NA 132* 134* 133* 136 136 139 142  ?K 3.4* 4.3 4.2 3.7 3.8 3.3* 4.4  ?CL 102 105 104 104 104 107 108  ?CO2 23 23 21* '25 26 28 29  '$ ?GLUCOSE 123* 99 117* 111* 126* 138* 110*  ?BUN '15 15 19 20 23 22 17  '$ ?CREATININE 1.05 1.00 1.07 1.01 1.08 1.25* 0.90  ?CALCIUM 10.0 9.9 9.9 10.3 10.2 10.4* 10.9*  ?MG 2.0 1.8  --   --  2.1 2.2 2.1  ? ?Liver Function Tests: ?Recent Labs  ?Lab 07/15/21 ?0500 07/18/21 ?0926 07/20/21 ?0527  ?AST 18 81* 36  ?ALT 14 57* 41  ?ALKPHOS 50 87 72  ?BILITOT 0.9 0.9 0.3  ?PROT 5.7* 6.1* 5.5*  ?ALBUMIN 2.4* 2.4* 2.1*  ? ?No results for input(s): LIPASE, AMYLASE in the last 168 hours. ?Recent Labs  ?Lab 07/13/21 ?7035  ?AMMONIA <10  ? ?Coagulation Profile: ?No results for input(s): INR, PROTIME in the last 168 hours. ?CBC: ?Recent Labs  ?Lab 07/15/21 ?0500 07/16/21 ?0093 07/17/21 ?8182 07/18/21 ?9937 07/19/21 ?1696 07/20/21 ?0527  ?WBC 6.9 8.0 5.7 4.6 3.3* 5.4  ?NEUTROABS 4.8  --   --   --   --   --   ?HGB 9.0* 8.7* 9.0* 9.2* 8.8* 8.6*  ?HCT 28.1* 27.7* 27.1*  29.8* 28.4* 28.5*  ?MCV 87.8 89.1 87.7 89.2 91.6 91.9  ?PLT 358 333 411* 450* 393 411*  ? ?Cardiac Enzymes: ?No results for input(s): CKTOTAL, CKMB, CKMBINDEX, TROPONINI in the last 168 hours. ?BNP: ?Invalid input(s): POCBNP ?CBG: ?Recent Labs  ?Lab 07/16/21 ?2109 07/17/21 ?3291 07/17/21 ?9166 07/17/21 ?2109 07/18/21 ?1019  ?GLUCAP 122* 107* 109* 155* 110*  ? ?HbA1C: ?No results for input(s): HGBA1C in the last 72 hours. ?Urine analysis: ?   ?Component Value Date/Time  ? [place] YELLOW 07/18/2021 1815  ? APPEARANCEUR CLOUDY (A) 07/18/2021 1815  ? LABSPEC 1.014 07/18/2021 1815  ? PHURINE 6.0 07/18/2021 1815  ? GLUCOSEU NEGATIVE  07/18/2021 1815  ? HGBUR SMALL (A) 07/18/2021 1815  ? [place] NEGATIVE 07/18/2021 1815  ? [place] NEGATIVE 07/18/2021 1815  ? PROTEINUR 30 (A) 07/18/2021 1815  ? NITRITE NEGATIVE 07/18/2021 1815  ? LEUKOCYTESUR N

## 2021-07-20 NOTE — Care Management Important Message (Signed)
Important Message ? ?Patient Details  ?Name: Carlos Perkins ?MRN: [id number] ?Date of Birth: 1934/04/21 ? ? ?Medicare Important Message Given:  Yes ? ? ? ? ?Tommy Medal ?07/20/2021, 12:09 PM ?

## 2021-07-20 NOTE — Assessment & Plan Note (Signed)
Family confirms DNR ?Treat the treatable for now, plan to take home with hospice once stable ?

## 2021-07-21 ENCOUNTER — Encounter (HOSPITAL_COMMUNITY)
Admission: EM | Disposition: A | Discharge status: Home Health | Payer: Self-pay | Source: Home / Self Care | Attending: Internal Medicine

## 2021-07-21 DIAGNOSIS — K922 Gastrointestinal hemorrhage, unspecified: Secondary | ICD-10-CM | POA: Diagnosis not present

## 2021-07-21 DIAGNOSIS — G9341 Metabolic encephalopathy: Secondary | ICD-10-CM | POA: Diagnosis not present

## 2021-07-21 DIAGNOSIS — R531 Weakness: Secondary | ICD-10-CM | POA: Diagnosis not present

## 2021-07-21 DIAGNOSIS — Z7189 Other specified counseling: Secondary | ICD-10-CM | POA: Diagnosis not present

## 2021-07-21 HISTORY — PX: GIVENS CAPSULE STUDY: SHX5432

## 2021-07-21 LAB — CBC
HCT: 28.3 % — ABNORMAL LOW (ref 39.0–52.0)
Hemoglobin: 8.9 g/dL — ABNORMAL LOW (ref 13.0–17.0)
MCH: 28.6 pg (ref 26.0–34.0)
MCHC: 31.4 g/dL (ref 30.0–36.0)
MCV: 91 fL (ref 80.0–100.0)
Platelets: 403 10*3/uL — ABNORMAL HIGH (ref 150–400)
RBC: 3.11 MIL/uL — ABNORMAL LOW (ref 4.22–5.81)
RDW: 14.7 % (ref 11.5–15.5)
WBC: 6.1 10*3/uL (ref 4.0–10.5)
nRBC: 0 % (ref 0.0–0.2)

## 2021-07-21 LAB — URINE CULTURE: Culture: 30000 — AB

## 2021-07-21 LAB — COMPREHENSIVE METABOLIC PANEL
ALT: 36 U/L (ref 0–44)
AST: 32 U/L (ref 15–41)
Albumin: 2.2 g/dL — ABNORMAL LOW (ref 3.5–5.0)
Alkaline Phosphatase: 71 U/L (ref 38–126)
Anion gap: 5 (ref 5–15)
BUN: 16 mg/dL (ref 8–23)
CO2: 27 mmol/L (ref 22–32)
Calcium: 10.2 mg/dL (ref 8.9–10.3)
Chloride: 109 mmol/L (ref 98–111)
Creatinine, Ser: 0.93 mg/dL (ref 0.61–1.24)
GFR, Estimated: >60 mL/min (ref >60)
Glucose, Bld: 113 mg/dL — ABNORMAL HIGH (ref 70–99)
Potassium: 3.6 mmol/L (ref 3.5–5.1)
Sodium: 141 mmol/L (ref 135–145)
Total Bilirubin: 0.3 mg/dL (ref 0.3–1.2)
Total Protein: 5.4 g/dL — ABNORMAL LOW (ref 6.5–8.1)

## 2021-07-21 LAB — PARATHYROID HORMONE, INTACT (NO CA): PTH: 32 pg/mL (ref 15–65)

## 2021-07-21 SURGERY — IMAGING PROCEDURE, GI TRACT, INTRALUMINAL, VIA CAPSULE

## 2021-07-21 MED ORDER — SODIUM CHLORIDE 0.9 % IV SOLN
125.0000 mg | Freq: Once | INTRAVENOUS | Status: AC
  Administered 2021-07-21: 125 mg via INTRAVENOUS
  Filled 2021-07-21: qty 2.5

## 2021-07-21 MED ORDER — SODIUM CHLORIDE 0.9 % IV SOLN: INTRAVENOUS | Status: AC

## 2021-07-21 MED ORDER — SODIUM CHLORIDE 0.9 % IV SOLN
3.0000 g | Freq: Four times a day (QID) | INTRAVENOUS | Status: DC
  Administered 2021-07-21 – 2021-07-24 (×12): 3 g via INTRAVENOUS
  Filled 2021-07-21 (×16): qty 8

## 2021-07-21 NOTE — Progress Notes (Signed)
?Subjective: ? ?Patient has no complaints.  He has not been confused today according to nursing staff.  He has been able to drink water and swallow pills without any difficulty.  No melena or rectal bleeding reported. ? ?Current Medications: ? ?Current Facility-Administered Medications:  ?  0.9 %  sodium chloride infusion, , Intravenous, Continuous, Eloise Harman, DO, Last Rate: 10 mL/hr at 07/14/21 1144, Bolus from Bag at 07/14/21 1144 ?  0.9 %  sodium chloride infusion, , Intravenous, Continuous, Eloise Harman, DO, Last Rate: 10 mL/hr at 07/15/21 1615, Bolus from Bag at 07/15/21 1615 ?  0.9 % NaCl with KCl 20 mEq/ L  infusion, , Intravenous, Continuous, Tat, David, MD, Last Rate: 125 mL/hr at 07/20/21 1841, Rate Change at 07/20/21 1841 ?  [MAR Hold] acetaminophen (TYLENOL) tablet 650 mg, 650 mg, Oral, Q6H PRN **OR** [MAR Hold] acetaminophen (TYLENOL) suppository 650 mg, 650 mg, Rectal, Q6H PRN, Zierle-Ghosh, Asia B, DO, 650 mg at 07/19/21 0032 ?  [MAR Hold] cinacalcet (SENSIPAR) tablet 30 mg, 30 mg, Oral, Once per day on Mon Wed Fri, Zierle-Ghosh, [place] B, DO, 30 mg at 07/20/21 8850 ?  [MAR Hold] cyanocobalamin ((VITAMIN B-12)) injection 1,000 mcg, 1,000 mcg, Intramuscular, Daily, Barton Dubois, MD, 1,000 mcg at 07/20/21 0950 ?  [MAR Hold] folic acid (FOLVITE) tablet 1 mg, 1 mg, Oral, Daily, Tat, David, MD, 1 mg at 07/20/21 1901 ?  [MAR Hold] ondansetron (ZOFRAN) tablet 4 mg, 4 mg, Oral, Q6H PRN **OR** [MAR Hold] ondansetron (ZOFRAN) injection 4 mg, 4 mg, Intravenous, Q6H PRN, Zierle-Ghosh, Asia B, DO, 4 mg at 07/21/21 0431 ?  [MAR Hold] pantoprazole (PROTONIX) injection 40 mg, 40 mg, Intravenous, Q12H, Pham, Minh Q, RPH-CPP, 40 mg at 07/20/21 2157 ?  [MAR Hold] piperacillin-tazobactam (ZOSYN) IVPB 3.375 g, 3.375 g, Intravenous, Q8H, Tat, David, MD, Last Rate: 12.5 mL/hr at 07/21/21 0543, 3.375 g at 07/21/21 0543 ?  [MAR Hold] predniSONE (DELTASONE) tablet 5 mg, 5 mg, Oral, Q breakfast, Zierle-Ghosh, Asia  B, DO, 5 mg at 07/20/21 0950 ? ? ?Objective: ?Blood pressure (!) 151/88, pulse 81, temperature 98.3 ?F (36.8 ?C), resp. rate 20, height _0  (1.778 m), weight 92.6 kg, SpO2 90 %. ?Patient is alert and responds appropriate to questions. ? ?Labs/studies Results: ? ? ? ?  Latest Ref Rng & Units 07/21/2021  ?  6:03 AM 07/20/2021  ?  5:27 AM 07/19/2021  ?  5:08 AM  ?CBC  ?WBC 4.0 - 10.5 K/uL 6.1   5.4   3.3    ?Hemoglobin 13.0 - 17.0 g/dL 8.9   8.6   8.8    ?Hematocrit 39.0 - 52.0 % 28.3   28.5   28.4    ?Platelets 150 - 400 K/uL 403   411   393    ?  ? ?  Latest Ref Rng & Units 07/21/2021  ?  6:03 AM 07/20/2021  ?  5:27 AM 07/19/2021  ?  5:08 AM  ?CMP  ?Glucose 70 - 99 mg/dL 113   110   138    ?BUN 8 - 23 mg/dL _1 ?Creatinine 0.61 - 1.24 mg/dL 0.93   0.90   1.25    ?Sodium 135 - 145 mmol/L 141   142   139    ?Potassium 3.5 - 5.1 mmol/L 3.6   4.4   3.3    ?Chloride 98 - 111 mmol/L 109   108   107    ?  CO2 22 - 32 mmol/L _0 ?Calcium 8.9 - 10.3 mg/dL 10.2   10.9   10.4    ?Total Protein 6.5 - 8.1 g/dL 5.4   5.5     ?Total Bilirubin 0.3 - 1.2 mg/dL 0.3   0.3     ?Alkaline Phos 38 - 126 U/L 71   72     ?AST 15 - 41 U/L 32   36     ?ALT 0 - 44 U/L 36   41     ?  ? ?  Latest Ref Rng & Units 07/21/2021  ?  6:03 AM 07/20/2021  ?  5:27 AM 07/18/2021  ?  9:26 AM  ?Hepatic Function  ?Total Protein 6.5 - 8.1 g/dL 5.4   5.5   6.1    ?Albumin 3.5 - 5.0 g/dL 2.2   2.1   2.4    ?AST 15 - 41 U/L 32   36   81    ?ALT 0 - 44 U/L 36   41   57    ?Alk Phosphatase 38 - 126 U/L 71   72   87    ?Total Bilirubin 0.3 - 1.2 mg/dL 0.3   0.3   0.9    ?  ? ?Assessment: ? ?#1.  Acute on chronic anemia.  No evidence of overt GI bleed but stool was guaiac positive on admission.  He underwent esophagogastroduodenoscopy and colonoscopy 4 days ago revealing small sliding hiatal hernia, duodenal diverticulum without stigmata of bleeding and he had 6 polyps removed from his colon including 1 large large polyp.  He also had diverticulosis.   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 ?Given capsule study recommended but delayed because of change in mental status.  ?Patient was able to swallow given capsule this morning. ? ?#2.  Mental status changes.  He has been confused and intermittently drowsy.  Mental status changes felt to be multifactorial i.e. acute illness, SIRS and I wonder if B12 deficiency is playing a role. ? ? ?Recommendations ? ?We proceeded with small bowel given capsule study. ?He was able to swallow given capsule without any difficulty. ?Study will be reviewed tomorrow morning. ? ? ? ? ? ?

## 2021-07-21 NOTE — Progress Notes (Addendum)
? ? ? ? ? ? ?  Date: 07/21/2021 ? ?Patient name: Carlos Perkins  ?Medical record number: [id number]  ?Date of birth: 1934-09-13  ? ? ?I was alerted too + blood culture by [facility] alert ? ?In discussing case with Darquita from Microbiology: ? ?We know that the Staph epidermidis that grew from the left forearm is OX, Methicillin S ? ?However the blood culture from left [facility] that is growing Staph epi and Staph hominis had MeC A gene detected ? ?IF we find that BOTH Staph epidermidis species are OX/METH S then would need to treat this as TRUE bacteremia. However if one is MR and the other MS I would consider these all to be contaminants ? ?We will de-escalate to unasyn in the interim. ? ?Case discussed with Dr. Carles Collet. ? ? ? ? ?Rhina Brack[place] ?07/21/2021, 12:39 PM ? ?

## 2021-07-21 NOTE — Progress Notes (Signed)
?  ?       ?PROGRESS NOTE ? ?Carlos Perkins OZH:[id number] DOB: 1934-09-02 DOA: 07/13/2021 ?PCP: Carlos Crouch, MD ? ?Brief History:  ?As per H&P written by Dr. Clearence Ped on 07/14/21 ?Carlos Perkins is a 87 y.o. male with medical history significant of hyperlipidemia, hypertension, myocardial infarction, CHF, paroxysmal atrial fibrillation, and more presents the ED with a chief complaint of generalized weakness.  Patient's family had reported to the ED provider that patient has had a general decline over a couple of months.  He lost his closest living relative 2 months ago, and has been declining since then.  Patient reports that he had a fall 2 weeks ago on Monday.  Since then he thinks he has been getting worse.  He reports he cannot stand up mostly due to weakness, but also pain.  He has sharp pains in his toes when he tries to stand.  He reports the weakness and the pains are equal in the bilateral lower extremities.  He also has sharp pains in his wrist.  His grip strength is significantly decreased because of this.  He reports the symptoms have been present for 2 weeks.  He denies any fatigue.  He reports he is short of breath but its not exertional.  He denies chest pain, hematochezia, melena, fever.  He denies asymmetric weakness, numbness, or hypersensitivity.  He has no other complaints at this time. ?  ?Patient does not smoke, does not drink alcohol, does not use illicit drugs.  He is vaccinated for COVID.  Patient is DNR. ?On 07/18/21 am, patient had altered mental status with increasing lethargy.  Work up was initiated as discussed below. ?   ? ? ? ?Assessment and Plan: ?* Generalized weakness ?-multifactorial.indluding symptomatic anemia; infection, low B12, electrolyte derangements; deconditioning ?-TSH within normal limits ?-B12 significantly low and will be repleted. ?-Gentle fluid resuscitation has been provided and electrolytes have been repleted. ?-CT head without acute  intracranial normalities. ?-Ammonia is less than 10 ?-Case discussed with gastroenterology service who has recommended endoscopic evaluation.  Family in agreement. ?-Baseline hemoglobin is 11 ? ?Acute metabolic encephalopathy ?4/19 am--lethargic, but falls asleep without stimulation ?-suspect new infectious process and hypercalcemia ?4/21 corrected calcium 11.7 ?B12--130>>repleted IM ?Folate 10.1>>orally supplementing ?TSH 0.812 ?Ammonia <10 ?-ABG--7.41/46/76/28 2L ?-CT brain--neg for acute findings ?-UA/culture--no pyuria ?-blood culture--staph epi 2/2>>await final ?-07/21/21--mental status improving--conversant, following commands ?-increase IVF ? ?SIRS (systemic inflammatory response syndrome) (HCC) ?4/19 am--fever 102.7 with tachypnea ?-check PCT <0.10 ?-lactate--1.5 ?-blood culture GPC one of two ?-CXR--personally reviewed>>L-base opacity ?-CT chest LLL atelectasis with L-effusion ?-UA/culture--urine culture = providencia ?-continue empiric zosyn>>unasyn ?-MRSA screen neg ? ? ?PAF (paroxysmal atrial fibrillation) ([place]) ?-holding coreg due to soft BPs ?-Currently in A-fib, rate controlled ?-Continue telemetry monitoring ?-Holding anticoagulation in the setting of positive fecal occult blood test and need for endoscopic evaluation. ?-EGD/colon unrevealing>>restart apixaban when ok with GI ? ?Symptomatic anemia ?-Hemoglobin baseline around 11  ?-FOBT positive ?-Appreciate assistance and recommendation by GI  ?-4/18 colonoscopy-- 6 polyps in transv & asc colon, 3 polyps in sigmoid and desc colon; nonbleeding internal hemorrhoids ?-4/18 EGD--nonbleeding duodenal diverticulum; +hiatus hernia ?-restart apixaban when ok with GI ?-Hgb has remained stable during hospitalization ?4/22--capsule study ? ?Chronic HFrEF (heart failure with reduced ejection fraction) ([place]) ?-holding Lasix, Coreg, ACE and spironolactone as an outpatient. ?-appears clinically euvolemic ?-Continue to follow daily weights and strict I's and  O's. ? ?Hypokalemia ?-Potassium 3.4 at time of admission ?-Repleted and within normal limits  currently. ?-Continue to follow electrolytes trend. ?-Presentation of low potassium in the setting of decreased oral intake and continued use of diuretics. ? ? ?Goals of care, counseling/discussion ?Family confirms DNR ?Treat the treatable for now, plan to take home with hospice once stable ? ?Prostate cancer metastatic to bone [facility]) ?-Continue outpatient follow-up with urology/oncology ?-Currently taking Zytiga, which is on hold at this time. ? ? ?Hypertension ?-Continue Coreg and ACE initially>>hold now as pt cannot take po due to lethargy and soft BPs ?-Continue to follow vital signs. ? ?Hyperlipidemia, unspecified ?-Continue statin ? ?Hypercalcemia ?Due to prostate cancer with mets to bone ?4/21 corrected calcium = 12.4>>11.6 ?Give one dose bisphosphonate 4/21 ? ? ?Family Communication:   Family at bedside updated 4/22 ?  ?Consultants:  palliative ?  ?Code Status:  DNR ?  ?DVT Prophylaxis:  apixaban ?  ?  ?Procedures: ?As Listed in Progress Note Above ?  ?Antibiotics: ?Zosyn 4/19>>4/22 ?Unasyn 4/22>> ? ? ? ?Subjective: ?Patient denies fevers, chills, headache, chest pain, dyspnea, nausea, vomiting, diarrhea, abdominal pain, dysuria, hematuria, hematochezia, and melena. ? ? ?Objective: ?Vitals:  ? 07/20/21 2142 07/21/21 0424 07/21/21 0500 07/21/21 1353  ?BP: 118/68 (!) 151/88  123/80  ?Pulse: 70 81  76  ?Resp: '19 20  18  '$ ?Temp: 98.2 ?F (36.8 ?C) 98.3 ?F (36.8 ?C)  (!) 97.5 ?F (36.4 ?C)  ?TempSrc:    Oral  ?SpO2: 97% 90%  93%  ?Weight:   92.6 kg   ?Height:      ? ? ?Intake/Output Summary (Last 24 hours) at 07/21/2021 1752 ?Last data filed at 07/21/2021 1417 ?Gross per 24 hour  ?Intake 2094.53 ml  ?Output 1001 ml  ?Net 1093.53 ml  ? ?Weight change: 4.2 kg ?Exam: ? ?General:  Pt is alert, follows commands appropriately, not in acute distress ?HEENT: No icterus, No thrush, No neck mass, [place]/AT ?Cardiovascular: IRRR, S1/S2, no  rubs, no gallops ?Respiratory: fine bibasilar rales.  No wheeze ?Abdomen: Soft/+BS, non tender, non distended, no guarding ?Extremities: No edema, No lymphangitis, No petechiae, No rashes, no synovitis ? ? ?Data Reviewed: ?I have personally reviewed following labs and imaging studies ?Basic Metabolic Panel: ?Recent Labs  ?Lab 07/15/21 ?0500 07/16/21 ?7681 07/17/21 ?1572 07/18/21 ?6203 07/19/21 ?5597 07/20/21 ?4163 07/21/21 ?0603  ?NA 134*   < > 136 136 139 142 141  ?K 4.3   < > 3.7 3.8 3.3* 4.4 3.6  ?CL 105   < > 104 104 107 108 109  ?CO2 23   < > '25 26 28 29 27  '$ ?GLUCOSE 99   < > 111* 126* 138* 110* 113*  ?BUN 15   < > '20 23 22 17 16  '$ ?CREATININE 1.00   < > 1.01 1.08 1.25* 0.90 0.93  ?CALCIUM 9.9   < > 10.3 10.2 10.4* 10.9* 10.2  ?MG 1.8  --   --  2.1 2.2 2.1  --   ? < > = values in this interval not displayed.  ? ?Liver Function Tests: ?Recent Labs  ?Lab 07/15/21 ?0500 07/18/21 ?0926 07/20/21 ?8453 07/21/21 ?0603  ?AST 18 81* 36 32  ?ALT 14 57* 41 36  ?ALKPHOS 50 87 72 71  ?BILITOT 0.9 0.9 0.3 0.3  ?PROT 5.7* 6.1* 5.5* 5.4*  ?ALBUMIN 2.4* 2.4* 2.1* 2.2*  ? ?No results for input(s): LIPASE, AMYLASE in the last 168 hours. ?No results for input(s): AMMONIA in the last 168 hours. ?Coagulation Profile: ?No results for input(s): INR, PROTIME in the last 168 hours. ?CBC: ?  Recent Labs  ?Lab 07/15/21 ?0500 07/16/21 ?1388 07/17/21 ?7195 07/18/21 ?9747 07/19/21 ?1855 07/20/21 ?0158 07/21/21 ?0603  ?WBC 6.9   < > 5.7 4.6 3.3* 5.4 6.1  ?NEUTROABS 4.8  --   --   --   --   --   --   ?HGB 9.0*   < > 9.0* 9.2* 8.8* 8.6* 8.9*  ?HCT 28.1*   < > 27.1* 29.8* 28.4* 28.5* 28.3*  ?MCV 87.8   < > 87.7 89.2 91.6 91.9 91.0  ?PLT 358   < > 411* 450* 393 411* 403*  ? < > = values in this interval not displayed.  ? ?Cardiac Enzymes: ?No results for input(s): CKTOTAL, CKMB, CKMBINDEX, TROPONINI in the last 168 hours. ?BNP: ?Invalid input(s): POCBNP ?CBG: ?Recent Labs  ?Lab 07/16/21 ?2109 07/17/21 ?6825 07/17/21 ?7493 07/17/21 ?2109  07/18/21 ?1019  ?GLUCAP 122* 107* 109* 155* 110*  ? ?HbA1C: ?No results for input(s): HGBA1C in the last 72 hours. ?Urine analysis: ?   ?Component Value Date/Time  ? [place] YELLOW 07/18/2021 1815  ? APPEARANCEUR CLOUDY (A) 04/1

## 2021-07-22 DIAGNOSIS — I5022 Chronic systolic (congestive) heart failure: Secondary | ICD-10-CM | POA: Diagnosis not present

## 2021-07-22 DIAGNOSIS — G9341 Metabolic encephalopathy: Secondary | ICD-10-CM | POA: Diagnosis not present

## 2021-07-22 DIAGNOSIS — R531 Weakness: Secondary | ICD-10-CM | POA: Diagnosis not present

## 2021-07-22 DIAGNOSIS — R7881 Bacteremia: Secondary | ICD-10-CM | POA: Diagnosis not present

## 2021-07-22 DIAGNOSIS — B957 Other staphylococcus as the cause of diseases classified elsewhere: Secondary | ICD-10-CM

## 2021-07-22 LAB — CULTURE, BLOOD (ROUTINE X 2): Special Requests: ADEQUATE

## 2021-07-22 LAB — CBC
HCT: 27.4 % — ABNORMAL LOW (ref 39.0–52.0)
Hemoglobin: 8.4 g/dL — ABNORMAL LOW (ref 13.0–17.0)
MCH: 27.9 pg (ref 26.0–34.0)
MCHC: 30.7 g/dL (ref 30.0–36.0)
MCV: 91 fL (ref 80.0–100.0)
Platelets: 390 10*3/uL (ref 150–400)
RBC: 3.01 MIL/uL — ABNORMAL LOW (ref 4.22–5.81)
RDW: 14.7 % (ref 11.5–15.5)
WBC: 6.5 10*3/uL (ref 4.0–10.5)
nRBC: 0 % (ref 0.0–0.2)

## 2021-07-22 LAB — COMPREHENSIVE METABOLIC PANEL
ALT: 55 U/L — ABNORMAL HIGH (ref 0–44)
AST: 58 U/L — ABNORMAL HIGH (ref 15–41)
Albumin: 2.2 g/dL — ABNORMAL LOW (ref 3.5–5.0)
Alkaline Phosphatase: 70 U/L (ref 38–126)
Anion gap: 4 — ABNORMAL LOW (ref 5–15)
BUN: 15 mg/dL (ref 8–23)
CO2: 26 mmol/L (ref 22–32)
Calcium: 9.5 mg/dL (ref 8.9–10.3)
Chloride: 110 mmol/L (ref 98–111)
Creatinine, Ser: 0.96 mg/dL (ref 0.61–1.24)
GFR, Estimated: >60 mL/min (ref >60)
Glucose, Bld: 104 mg/dL — ABNORMAL HIGH (ref 70–99)
Potassium: 4 mmol/L (ref 3.5–5.1)
Sodium: 140 mmol/L (ref 135–145)
Total Bilirubin: 0.3 mg/dL (ref 0.3–1.2)
Total Protein: 5.3 g/dL — ABNORMAL LOW (ref 6.5–8.1)

## 2021-07-22 LAB — MAGNESIUM: Magnesium: 1.9 mg/dL (ref 1.7–2.4)

## 2021-07-22 MED ORDER — FERROUS SULFATE 325 (65 FE) MG PO TABS
325.0000 mg | ORAL_TABLET | Freq: Every day | ORAL | Status: DC
  Administered 2021-07-23 – 2021-07-24 (×2): 325 mg via ORAL
  Filled 2021-07-22 (×2): qty 1

## 2021-07-22 MED ORDER — PANTOPRAZOLE SODIUM 40 MG PO TBEC
40.0000 mg | DELAYED_RELEASE_TABLET | Freq: Every day | ORAL | Status: DC
  Administered 2021-07-23 – 2021-07-24 (×2): 40 mg via ORAL
  Filled 2021-07-22 (×2): qty 1

## 2021-07-22 NOTE — Progress Notes (Signed)
CSW spoke with Randall Hiss of [facility] to inform him of discharge plan. ? ?Madilyn Fireman, MSW, LCSW ?Transitions of Care  Clinical Social Worker II ?[phone] ? ?

## 2021-07-22 NOTE — Progress Notes (Signed)
?  ?       ?PROGRESS NOTE ? ?Carlos Perkins VFI:[id number] DOB: 1935/01/13 DOA: 07/13/2021 ?PCP: Idelle Crouch, MD ? ?Brief History:  ?As per H&P written by Dr. Clearence Ped on 07/14/21 ?Carlos Perkins is a 87 y.o. male with medical history significant of hyperlipidemia, hypertension, myocardial infarction, CHF, paroxysmal atrial fibrillation, and more presents the ED with a chief complaint of generalized weakness.  Patient's family had reported to the ED provider that patient has had a general decline over a couple of months.  He lost his closest living relative 2 months ago, and has been declining since then.  Patient reports that he had a fall 2 weeks ago on Monday.  Since then he thinks he has been getting worse.  He reports he cannot stand up mostly due to weakness, but also pain.  He has sharp pains in his toes when he tries to stand.  He reports the weakness and the pains are equal in the bilateral lower extremities.  He also has sharp pains in his wrist.  His grip strength is significantly decreased because of this.  He reports the symptoms have been present for 2 weeks.  He denies any fatigue.  He reports he is short of breath but its not exertional.  He denies chest pain, hematochezia, melena, fever.  He denies asymmetric weakness, numbness, or hypersensitivity.  He has no other complaints at this time. ?  ?Patient does not smoke, does not drink alcohol, does not use illicit drugs.  He is vaccinated for COVID.  Patient is DNR. ?On 07/18/21 am, patient had altered mental status with increasing lethargy.  Work up was initiated as discussed below. ?   ? ? ? ?Assessment and Plan: ?* Generalized weakness ?-multifactorial.indluding symptomatic anemia; infection, low B12, electrolyte derangements; deconditioning ?-TSH within normal limits ?-B12 significantly low and will be repleted. ?-Gentle fluid resuscitation has been provided and electrolytes have been repleted. ?-CT head without acute  intracranial normalities. ?-Ammonia is less than 10 ?-Baseline hemoglobin is 11 ? ?Acute metabolic encephalopathy ?4/19 am--lethargic, but falls asleep without stimulation ?-suspect new infectious process and hypercalcemia ?4/21 corrected calcium 11.7 ?B12--130>>repleted IM ?Folate 10.1>>orally supplementing ?TSH 0.812 ?Ammonia <10 ?-ABG--7.41/46/76/28 2L ?-CT brain--neg for acute findings ?-UA/culture--no pyuria ?-blood culture--staph epi 2/2>>await final>>discussed with Dr. Drucilla Schmidt ?-07/22/21--mental status improving--conversant, following commands ?-increase IVF ? ?SIRS (systemic inflammatory response syndrome) (HCC) ?4/19 am--fever 102.7 with tachypnea ?-check PCT <0.10 ?-lactate--1.5 ?-blood culture GPC one of two ?-CXR--personally reviewed>>L-base opacity ?-CT chest LLL atelectasis with L-effusion ?-UA/culture--urine culture = providencia ?-continue empiric zosyn>>unasyn ?-MRSA screen neg ? ? ?PAF (paroxysmal atrial fibrillation) ([place]) ?-holding coreg due to soft BPs ?-Currently in A-fib, rate controlled ?-Continue telemetry monitoring ?-Holding anticoagulation in the setting of positive fecal occult blood test and need for endoscopic evaluation. ?-EGD/colon unrevealing>>restart apixaban 4/24 ? 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 ?Study is incomplete as capsule did not reach cecum ?Discussed with Dr. Trula Ore apixaban 4/24 ? ?Coag negative Staphylococcus bacteremia ?Staph epi bacteremia--2/2 sets OX sensitive ?Continue unasyn ?Repeat blood culture ?Echo ? ?Chronic HFrEF (heart  failure with reduced ejection fraction) ([place]) ?-holding Lasix, Coreg, ACE and spironolactone as an outpatient. ?-appears  clinically euvolemic ?-Continue to follow daily weights and strict I's and O's. ? ?Hypokalemia ?-Potassium 3.4 at time of admission ?-Repleted and within normal limits currently. ? ?Goals of care, counseling/discussion ?Family confirms DNR ?Treat the treatable for now, plan to take home with hospice once stable ? ?Prostate cancer metastatic to bone [facility]) ?-Continue outpatient follow-up with urology/oncology ?-Currently taking Zytiga, which is on hold at this time>>restart after d/c ? ? ?Hypertension ?-Continue Coreg and ACE initially>>hold now as pt cannot take po due to lethargy and soft BPs ?-Continue to follow vital signs. ?-BPs acceptable ? ?Hyperlipidemia, unspecified ?-Continue statin ? ?Hypercalcemia ?Due to prostate cancer with mets to bone ?4/21 corrected calcium = 12.4>>11.6>>10.9 ?Give one dose bisphosphonate 4/21 ?Continue IVF ? ? ? ? ? ? ?  ?Family Communication:   Family at bedside updated 07/22/21 ?  ?Consultants:  palliative ?  ?Code Status:  DNR ?  ?DVT Prophylaxis:  apixaban ?  ?  ?Procedures: ?As Listed in Progress Note Above ?  ?Antibiotics: ?Zosyn 4/19>>4/22 ?Unasyn 4/22>> ? ? ? ?Subjective: ?Patient denies fevers, chills, headache, chest pain, dyspnea, nausea, vomiting, diarrhea, abdominal pain, dysuria ? ? ?Objective: ?Vitals:  ? 07/21/21 1353 07/21/21 2000 07/22/21 0503 07/22/21 1424  ?BP: 123/80 131/81 120/78 130/80  ?Pulse: 76 93 80 88  ?Resp: '18 18 20 20  '$ ?Temp: (!) 97.5 ?F (36.4 ?C) 98.4 ?F (36.9 ?C) 97.6 ?F (36.4 ?C) 97.9 ?F (36.6 ?C)  ?TempSrc: Oral Oral  Oral  ?SpO2: 93% 100% 98% 100%  ?Weight:   96.8 kg   ?Height:      ? ? ?Intake/Output Summary (Last 24 hours) at 07/22/2021 1614 ?Last data filed at 07/22/2021 1541 ?Gross per 24 hour  ?Intake 2184.13 ml  ?Output --  ?Net 2184.13 ml  ? ?Weight change: 4.2 kg ?Exam: ? ?General:  Pt is alert, follows commands  appropriately, not in acute distress ?HEENT: No icterus, No thrush, No neck mass, [place]/AT ?Cardiovascular: RRR, S1/S2, no rubs, no gallops ?Respiratory: bibasilar rales.  No wheeze ?Abdomen: Soft/+BS, non tender, non distended, no guarding ?Extremities: 1 + LE edema, No lymphangitis, No petechiae, No rashes, no synovitis ? ? ?Data Reviewed: ?I have personally reviewed following labs and imaging studies ?Basic Metabolic Panel: ?Recent Labs  ?Lab 07/18/21 ?0926 07/19/21 ?6237 07/20/21 ?6283 07/21/21 ?0603 07/22/21 ?0456  ?NA 136 139 142 141 140  ?K 3.8 3.3* 4.4 3.6 4.0  ?CL 104 107 108 109 110  ?CO2 '26 28 29 27 26  '$ ?GLUCOSE 126* 138* 110* 113* 104*  ?BUN '23 22 17 16 15  '$ ?CREATININE 1.08 1.25* 0.90 0.93 0.96  ?CALCIUM 10.2 10.4* 10.9* 10.2 9.5  ?MG 2.1 2.2 2.1  --  1.9  ? ?Liver Function Tests: ?Recent Labs  ?Lab 07/18/21 ?0926 07/20/21 ?1517 07/21/21 ?0603 07/22/21 ?0456  ?AST 81* 36 32 58*  ?ALT 57* 41 36 55*  ?ALKPHOS 87 72 71 70  ?BILITOT 0.9 0.3 0.3 0.3  ?PROT 6.1* 5.5* 5.4* 5.3*  ?ALBUMIN 2.4* 2.1* 2.2* 2.2*  ? ?No results for input(s): LIPASE, AMYLASE in the last 168 hours. ?No results for input(s): AMMONIA in the last 168 hours. ?Coagulation Profile: ?No results for input(s): INR, PROTIME in the last 168 hours. ?CBC: ?Recent Labs  ?Lab 07/18/21 ?0926 07/19/21 ?6160 07/20/21 ?7371 07/21/21 ?0603 07/22/21 ?0456  ?WBC 4.6 3.3* 5.4 6.1 6.5  ?HGB 9.2* 8.8* 8.6* 8.9* 8.4*  ?HCT 29.8* 28.4* 28.5* 28.3* 27.4*  ?MCV 89.2 91.6 91.9 91.0 91.0  ?PLT 450* 393 411* 403*  390  ? ?Cardiac Enzymes: ?No results for input(s): CKTOTAL, CKMB, CKMBINDEX, TROPONINI in the last 168 hours. ?BNP: ?Invalid input(s): POCBNP ?CBG: ?Recent Labs  ?Lab 07/16/21 ?2109 07/17/21 ?3094 07/17/21 ?0768 07/17/21 ?2109 07/18/21 ?1019  ?GLUCAP 122* 107* 109* 155* 110*  ? ?HbA1C: ?No results for input(s): HGBA1C in the last 72 hours. ?Urine analysis: ?   ?Component Value Date/Time  ? [place] YELLOW 07/18/2021 1815  ? APPEARANCEUR CLOUDY (A) 07/18/2021  1815  ? LABSPEC 1.014 07/18/2021 1815  ? PHURINE 6.0 07/18/2021 1815  ? GLUCOSEU NEGATIVE 07/18/2021 1815  ? HGBUR SMALL (A) 07/18/2021 1815  ? [place] NEGATIVE 07/18/2021 1815  ? [place] NEGATIVE 07/18/2021 1815  ? PROTEI

## 2021-07-22 NOTE — Op Note (Signed)
Small Bowel Carlos Perkins ?Procedure date: 07/21/2021 ? ?Referring Provider: Barton Dubois, MD ?PCP:  Dr. Doy Hutching, Leonie Douglas, MD ? ?Indication for procedure:   ?Patient is a 87-year-old Caucasian male who presents with anemia and heme positive stool and on work-up found to have iron deficiency anemia.  He underwent esophagogastroduodenoscopy followed by colonoscopy earlier in the week.  He had small sliding hiatal hernia and nonbleeding duodenal diverticulum.  Colonoscopy revealed 12 polyps.  Larger one was 12 mm located at sigmoid colon.  None of these polyps had stigmata of bleed.  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  Since no bleeding source was identified on endoscopic evaluation he is undergoing small bowel given capsule Perkins. ?Patient's anticoagulation is on hold. ? ?Findings:   ? ?Patient was able to swallow given capsule without any difficulty. ?Perkins duration 8 hours and 15 minutes but given capsule did not reach cecum.  Therefore Perkins incomplete. ?Small sliding hiatal hernia. ?Single small nonbleeding AV malformation noted in small bowel seen on image at 03:41:07. ?No blood or coffee-ground material noted in small bowel that was examined. ? ? ?First Gastric image: 2 hrs 40 min and 48 sec ?First Duodenal image: 3 hrs 1 min and 52 sec ?First Ileo-Cecal Valve image: Not applicable.  Cecum not reached ?First Cecal image: Not applicable ?Gastric Passage time: 21 min ?Small Bowel Passage time: Cannot be calculated as capsule did not reach cecum. ? ?Summary & Recommendations: ? ?Very slow passage of given capsule through the esophagus even in upright position indicative of esophageal motility disorder.  Prior EGD failed to show any structural abnormality. ?Small sliding hiatal hernia. ?Rapid transit of given capsule to stomach prompted by IV erythromycin. 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 ?Perkins is incomplete as capsule did not reach  cecum. ? ?Consider oral iron. ?Can resume anticoagulant on 07/23/2021. ?Will need to follow H&H closely over the next few weeks and thereafter less frequently. ?Consider reevaluation if he experiences overt GI bleed needing blood transfusion. ? ?  ?

## 2021-07-22 NOTE — Progress Notes (Signed)
? ? ? ? ? ? ? ?  Date: 07/22/2021 ? ?Patient name: Carlos Perkins  ?Medical record number: [id number]  ?Date of birth: [birth year]  ? ?Called by microbiology re cultures and the Staph EPI is S to OX from both cultures so we need to regard it as TRUE bacteremia ? ?I am ordering repeat blood cultures and TTE ? ?I would continue IV unasyn and search for source of bacteremia in skin. ? ?Will discuss with Dr. Carles Collet. ? ? ? ?Rhina Brackett [place] ?07/22/2021, 12:33 PM ? ?

## 2021-07-22 NOTE — Assessment & Plan Note (Addendum)
Staph epi bacteremia--2/2 sets OX sensitive ?Continue unasyn ?Repeat blood culture--neg to date ?Echo--07/23/21 EF 40%, global HK, RVSP 50.0, mild MR/TR ?Received 7 days IV abx ?Repeat blood cultures neg to date ?D/c home with cephalexin x 7 more days ?

## 2021-07-23 ENCOUNTER — Inpatient Hospital Stay: Payer: Medicare Other | Admitting: Oncology

## 2021-07-23 ENCOUNTER — Encounter (HOSPITAL_COMMUNITY): Payer: Self-pay | Admitting: Internal Medicine

## 2021-07-23 ENCOUNTER — Inpatient Hospital Stay (HOSPITAL_COMMUNITY): Payer: Medicare Other

## 2021-07-23 ENCOUNTER — Inpatient Hospital Stay: Payer: Medicare Other

## 2021-07-23 DIAGNOSIS — G9341 Metabolic encephalopathy: Secondary | ICD-10-CM | POA: Diagnosis not present

## 2021-07-23 DIAGNOSIS — Z789 Other specified health status: Secondary | ICD-10-CM | POA: Diagnosis not present

## 2021-07-23 DIAGNOSIS — I5022 Chronic systolic (congestive) heart failure: Secondary | ICD-10-CM | POA: Diagnosis not present

## 2021-07-23 DIAGNOSIS — R7881 Bacteremia: Secondary | ICD-10-CM

## 2021-07-23 DIAGNOSIS — A419 Sepsis, unspecified organism: Secondary | ICD-10-CM

## 2021-07-23 DIAGNOSIS — Z7189 Other specified counseling: Secondary | ICD-10-CM | POA: Diagnosis not present

## 2021-07-23 DIAGNOSIS — R531 Weakness: Secondary | ICD-10-CM | POA: Diagnosis not present

## 2021-07-23 DIAGNOSIS — Z515 Encounter for palliative care: Secondary | ICD-10-CM | POA: Diagnosis not present

## 2021-07-23 LAB — PROTEIN ELECTROPHORESIS, SERUM
A/G Ratio: 0.8 (ref 0.7–1.7)
Albumin ELP: 2 g/dL — ABNORMAL LOW (ref 2.9–4.4)
Alpha-1-Globulin: 0.4 g/dL (ref 0.0–0.4)
Alpha-2-Globulin: 0.8 g/dL (ref 0.4–1.0)
Beta Globulin: 0.8 g/dL (ref 0.7–1.3)
Gamma Globulin: 0.6 g/dL (ref 0.4–1.8)
Globulin, Total: 2.6 g/dL (ref 2.2–3.9)
Total Protein ELP: 4.6 g/dL — ABNORMAL LOW (ref 6.0–8.5)

## 2021-07-23 LAB — COMPREHENSIVE METABOLIC PANEL
ALT: 48 U/L — ABNORMAL HIGH (ref 0–44)
AST: 36 U/L (ref 15–41)
Albumin: 2.2 g/dL — ABNORMAL LOW (ref 3.5–5.0)
Alkaline Phosphatase: 72 U/L (ref 38–126)
Anion gap: 4 — ABNORMAL LOW (ref 5–15)
BUN: 13 mg/dL (ref 8–23)
CO2: 28 mmol/L (ref 22–32)
Calcium: 9.5 mg/dL (ref 8.9–10.3)
Chloride: 111 mmol/L (ref 98–111)
Creatinine, Ser: 0.9 mg/dL (ref 0.61–1.24)
GFR, Estimated: >60 mL/min (ref >60)
Glucose, Bld: 111 mg/dL — ABNORMAL HIGH (ref 70–99)
Potassium: 3.9 mmol/L (ref 3.5–5.1)
Sodium: 143 mmol/L (ref 135–145)
Total Bilirubin: 0.4 mg/dL (ref 0.3–1.2)
Total Protein: 5.3 g/dL — ABNORMAL LOW (ref 6.5–8.1)

## 2021-07-23 LAB — ECHOCARDIOGRAM COMPLETE
AR max vel: 2.24 cm2
AV Area VTI: 2.28 cm2
AV Area mean vel: 2.28 cm2
AV Mean grad: 3.7 mmHg
AV Peak grad: 7.5 mmHg
Ao pk vel: 1.37 m/s
Area-P 1/2: 1.32 cm2
Height: 70 in
MV VTI: 1.65 cm2
S' Lateral: 3.4 cm
Weight: 3453.29 oz

## 2021-07-23 LAB — CBC
HCT: 28.8 % — ABNORMAL LOW (ref 39.0–52.0)
Hemoglobin: 8.8 g/dL — ABNORMAL LOW (ref 13.0–17.0)
MCH: 27.8 pg (ref 26.0–34.0)
MCHC: 30.6 g/dL (ref 30.0–36.0)
MCV: 90.9 fL (ref 80.0–100.0)
Platelets: 340 10*3/uL (ref 150–400)
RBC: 3.17 MIL/uL — ABNORMAL LOW (ref 4.22–5.81)
RDW: 14.6 % (ref 11.5–15.5)
WBC: 5.6 10*3/uL (ref 4.0–10.5)
nRBC: 0.4 % — ABNORMAL HIGH (ref 0.0–0.2)

## 2021-07-23 LAB — CALCITRIOL (1,25 DI-OH VIT D): Vit D, 1,25-Dihydroxy: 70.8 pg/mL (ref 24.8–81.5)

## 2021-07-23 MED ORDER — APIXABAN 5 MG PO TABS
5.0000 mg | ORAL_TABLET | Freq: Two times a day (BID) | ORAL | Status: DC
  Administered 2021-07-23 – 2021-07-24 (×2): 5 mg via ORAL
  Filled 2021-07-23 (×2): qty 1

## 2021-07-23 MED ORDER — FERROUS SULFATE 325 (65 FE) MG PO TABS: 325.0000 mg | ORAL_TABLET | Freq: Every day | ORAL | 3 refills | Status: AC

## 2021-07-23 MED ORDER — FOLIC ACID 1 MG PO TABS: 1.0000 mg | ORAL_TABLET | Freq: Every day | ORAL | Status: AC

## 2021-07-23 MED ORDER — CYANOCOBALAMIN 500 MCG PO TABS: 500.0000 ug | ORAL_TABLET | Freq: Every day | ORAL | Status: AC

## 2021-07-23 MED ORDER — VITAMIN B-12 100 MCG PO TABS
500.0000 ug | ORAL_TABLET | Freq: Every day | ORAL | Status: DC
  Administered 2021-07-23 – 2021-07-24 (×2): 500 ug via ORAL
  Filled 2021-07-23 (×3): qty 5

## 2021-07-23 NOTE — Progress Notes (Signed)
Physical Therapy Treatment ?Patient Details ?Name: Carlos Perkins ?MRN: [id number] ?DOB: 19-Feb-1935 ?Today's Date: 07/23/2021 ? ? ?History of Present Illness Carlos Perkins is a 87 y.o. male with medical history significant of hyperlipidemia, hypertension, myocardial infarction, CHF, paroxysmal atrial fibrillation, and more presents the ED with a chief complaint of generalized weakness.  Patient's family had reported to the ED provider that patient has had a general decline over a couple of months.  He lost his closest living relative 2 months ago, and has been declining since then.  Patient reports that he had a fall 2 weeks ago on Monday.  Since then he thinks he has been getting worse.  He reports he cannot stand up mostly due to weakness, but also pain.  He has sharp pains in his toes when he tries to stand.  He reports the weakness and the pains are equal in the bilateral lower extremities.  He also has sharp pains in his wrist.  His grip strength is significantly decreased because of this.  He reports the symptoms have been present for 2 weeks.  He denies any fatigue.  He reports he is short of breath but its not exertional.  He denies chest pain, hematochezia, melena, fever.  He denies asymmetric weakness, numbness, or hypersensitivity.  He has no other complaints at this time. ? ?  ?PT Comments  ? ? Patient agreeable to participating in bed level exercises with PT today. Nursing reports patient just got settled back in bed after ultrasound. Daughter present throughout session. Patient performed bed level exercises with instruction. Therapist block right hip external rotation with heel slides. Patient unable to attain right knee extension and grimaced with therapist's overpressure in to extension. Therapist instructed patient and daughter on performing exercises at home and how to progress patient's activity level in an overall day with exercises, boosting self up in bed, sitting progressively more,  etc.  Patient would continue to benefit from skilled physical therapy in current environment and next venue to continue return to prior function and increase strength, endurance, balance, coordination, and functional mobility and gait skills.  ?   ?Recommendations for follow up therapy are one component of a multi-disciplinary discharge planning process, led by the attending physician.  Recommendations may be updated based on patient status, additional functional criteria and insurance authorization. ? ?Follow Up Recommendations ? Acute inpatient rehab (3hours/day) ?  ?  ?Assistance Recommended at Discharge Intermittent Supervision/Assistance  ?Patient can return home with the following A lot of help with walking and/or transfers;A lot of help with bathing/dressing/bathroom;Assistance with cooking/housework;Help with stairs or ramp for entrance ?  ?Equipment Recommendations ? None recommended by PT  ?  ?Recommendations for Other Services   ? ? ?  ?Precautions / Restrictions Precautions ?Precautions: Fall ?Restrictions ?Weight Bearing Restrictions: No  ?  ? ?Mobility ? Bed Mobility ?  ?  ? ?Transfers ?  ?  ? ?Ambulation/Gait ?  ?  ? ? ?Stairs ?  ? ? ?Wheelchair Mobility ?  ? ?Modified Rankin (Stroke Patients Only) ?  ? ? ?  ?Balance   ?  ? ?  ?Cognition Arousal/Alertness: Awake/alert ?Behavior During Therapy: [facility] for tasks assessed/performed ?Overall Cognitive Status: Within Functional Limits for tasks assessed ?  ?  ? ?  ?Exercises General Exercises - Upper Extremity ?Shoulder Flexion: AROM, Strengthening, Both, 10 reps, Supine ?General Exercises - Lower Extremity ?Short Arc Quad: AROM, Strengthening, Both, 10 reps, Supine ?Heel Slides: AROM, AAROM, Strengthening, Both, 10 reps, Supine (block righthip  external rotation) ?Toe Raises: AROM, Strengthening, Both, 10 reps, Supine ?Heel Raises: AROM, Strengthening, Both, 10 reps, Supine ?Other Exercises ?Other Exercises: boost up in bed using UE and LEs and bed in "boost"  position ? ?  ?General Comments   ?  ?  ? ?Pertinent Vitals/Pain Pain Assessment ?Pain Assessment: No/denies pain  ? ? ?Home Living   ?  ?  ?  ?  ?Prior Function    ?   ? ?PT Goals (current goals can now be found in the care plan section) Acute Rehab PT Goals ?Patient Stated Goal: Return home ?PT Goal Formulation: With patient/family ?Time For Goal Achievement: 07/28/21 ?Potential to Achieve Goals: Fair ?Progress towards PT goals: Progressing toward goals ? ?  ?Frequency ? ? ? Min 4X/week ? ? ? ?  ?PT Plan Current plan remains appropriate  ? ? ?   ?AM-PAC PT "6 Clicks" Mobility   ?Outcome Measure ? Help needed turning from your back to your side while in a flat bed without using bedrails?: A Little ?Help needed moving from lying on your back to sitting on the side of a flat bed without using bedrails?: A Lot ?Help needed moving to and from a bed to a chair (including a wheelchair)?: A Lot ?Help needed standing up from a chair using your arms (e.g., wheelchair or bedside chair)?: A Lot ?Help needed to walk in hospital room?: Total ?Help needed climbing 3-5 steps with a railing? : Total ?6 Click Score: 11 ? ?  ?End of Session   ?Activity Tolerance: Patient tolerated treatment well;Patient limited by fatigue ?Patient left: in bed;with call bell/phone within reach;with family/visitor present ?Nurse Communication: Mobility status ?PT Visit Diagnosis: Unsteadiness on feet (R26.81);Other abnormalities of gait and mobility (R26.89);History of falling (Z91.81);Muscle weakness (generalized) (M62.81) ?  ? ? ?Time: 9628-3662 ?PT Time Calculation (min) (ACUTE ONLY): 25 min ? ?Charges:  $Therapeutic Exercise: 8-22 mins ?$Self Care/Home Management: 8-22          ?          ? ?Floria Raveling. Hartnett-Rands, MS, PT ?Per [place] ?[place] #[id number] ? ?Hussain Maimone  Hartnett-Rands ?07/23/2021, 11:54 AM ? ?

## 2021-07-23 NOTE — Progress Notes (Signed)
Palliative: ? ?HPI: 87 y.o. male  with past medical history of hyperlipidemia, hypertension, myocardial infarction, HFrEF, paroxysmal atrial fibrillation, prostate cancer with bone mets (on Zytiga, Lupron; follows with Dr. Janese Banks [facility]) admitted on 07/13/2021 with weakness and pain in the setting of general decline over past few months after the loss of 2 close relatives. Being treated for symptomatic anemia, infection/SIRS, low B12, electrolyte derangements. S/P colonoscopy/EGD 4/18 with findings of multiple polyps, non-bleeding internal hemorrhoids, non-bleeding duodenal diverticulum, hiatal hernia. Anticoagulation on hold due to anemia.   ? ?I met today at Carlos Perkins's bedside along with two of his daughters (6 children total). He is awake and alert. He is anxious to get home and is anticipating discharge tomorrow. He shares that he is feeling much better. He is hopeful to do some rehab at home. Daughters say that their brother has a hospital bed and is prepared to receive him at his house to help care for him.  ? ?I discussed with them MOST form. We fully discussed and completed according to his stated wishes. He reviewed and signed off. He also shares that he wants to be buried locally with family (not sure where exactly). HCPOA is wife Carlos Perkins and secondary was his sister but she recently died. He is considering who he wants as his secondary HCPOA. He is hopeful for some level of improvement and to treat reversible conditions but he does not wish to die in the hospital. He would want to die at home. He does not want to go through interventions to prolong his life without hopes of some level of improvement.  ? ? ?Plan: ?- Home with rehab services and palliative to follow.  ?- MOST: DNR, Limited interventions (re-hospitalize if indicated), determine use or limitations of antibiotics when infection occurs, IVF for trial period, NO feeding tube.  ? ?40 min ? ?Vinie Sill, NP ?Palliative Medicine Team ?Pager  [phone] (Please see [email or web address] for schedule) ?Team Phone [phone]  ? ? ?Greater than 50%  of this time was spent counseling and coordinating care related to the above assessment and plan   ?

## 2021-07-23 NOTE — Assessment & Plan Note (Signed)
4/19 am--fever 102.7 with tachypnea ?Due to staph epi bacteremia ?-check PCT <0.10 ?-lactate--1.5 ?-blood culture GPC one of two ?-CXR--personally reviewed>>L-base opacity ?-CT chest LLL atelectasis with L-effusion ?-UA/culture--urine culture = providencia ?-continue empiric zosyn>>unasyn ?-MRSA screen neg ?

## 2021-07-23 NOTE — Progress Notes (Incomplete)
? ?Gastroenterology Progress Note  ? ?Referring Provider: Eloise Harman, DO ?Primary Care Physician:  Idelle Crouch, MD ?Primary Gastroenterologist:  Dr. Marland Kitchen ?Patient ID: Carlos Perkins; [id number]; 1935-03-24  ? ? ?Subjective  ? ?*** ? ? ?Objective  ? ?Vital signs in last 24 hours ?Temp:  [97.9 ?F (36.6 ?C)-98.6 ?F (37 ?C)] 98.6 ?F (37 ?C) (04/24 0520) ?Pulse Rate:  [42-88] 42 (04/24 0520) ?Resp:  [16-20] 16 (04/24 0520) ?BP: (122-130)/(80-90) 122/87 (04/24 0520) ?SpO2:  [99 %-100 %] 100 % (04/24 0520) ?Weight:  [97.9 kg] 97.9 kg (04/24 0520) ?Last BM Date : 07/21/21 ? ?Physical Exam ?General:   Alert and oriented, pleasant ?Head:  Normocephalic and atraumatic. ?Eyes:  No icterus, sclera clear. Conjuctiva pink.  ?Mouth:  Without lesions, mucosa pink and moist.  ?Neck:  Supple, without thyromegaly or masses.  ?Heart:  S1, S2 present, no murmurs noted.  ?Lungs: Clear to auscultation bilaterally, without wheezing, rales, or rhonchi.  ?Abdomen:  Bowel sounds present, soft, non-tender, non-distended. No HSM or hernias noted. No rebound or guarding. No masses appreciated  ?Msk:  Symmetrical without gross deformities. Normal posture. ?Pulses:  Normal pulses noted. ?Extremities:  Without clubbing or edema. ?Neurologic:  Alert and  oriented x4;  grossly normal neurologically. ?Skin:  Warm and dry, intact without significant lesions.  ?Cervical Nodes:  No significant cervical adenopathy. ?Psych:  Alert and cooperative. Normal mood and affect. ? ?Intake/Output from previous day: ?04/23 0701 - 04/24 0700 ?In: 1464.4 [P.O.:120; I.V.:944.4; IV Piggyback:400] ?Out: 1500 [Urine:1500] ?Intake/Output this shift: ?No intake/output data recorded. ? ?Lab Results ? ?Recent Labs  ?  07/21/21 ?0603 07/22/21 ?0456 07/23/21 ?0518  ?WBC 6.1 6.5 5.6  ?HGB 8.9* 8.4* 8.8*  ?HCT 28.3* 27.4* 28.8*  ?PLT 403* 390 340  ? ?BMET ?Recent Labs  ?  07/21/21 ?0603 07/22/21 ?0456 07/23/21 ?0518  ?NA 141 140 143  ?K 3.6 4.0 3.9  ?CL 109 110 111   ?CO2 '27 26 28  '$ ?GLUCOSE 113* 104* 111*  ?BUN '16 15 13  '$ ?CREATININE 0.93 0.96 0.90  ?CALCIUM 10.2 9.5 9.5  ? ?LFT ?Recent Labs  ?  07/21/21 ?0603 07/22/21 ?0456 07/23/21 ?0518  ?PROT 5.4* 5.3* 5.3*  ?ALBUMIN 2.2* 2.2* 2.2*  ?AST 32 58* 36  ?ALT 36 55* 48*  ?ALKPHOS 71 70 72  ?BILITOT 0.3 0.3 0.4  ? ?PT/INR ?No results for input(s): LABPROT, INR in the last 72 hours. ?Hepatitis Panel ?No results for input(s): HEPBSAG, HCVAB, HEPAIGM, HEPBIGM in the last 72 hours. ?C-Diff PCR ?{Pos/neg/positive for:31958} ? ?Studies/Results ?DG Wrist Complete Left ? ?Result Date: 07/14/2021 ?CLINICAL DATA:  Generalized weakness since being discharged from [facility] 1 week ago. Also complains of bilateral wrist swelling and soreness. EXAM: LEFT WRIST - COMPLETE 3+ VIEW COMPARISON:  None. FINDINGS: There is no evidence of an acute fracture or dislocation. A chronic fracture deformity is seen involving the distal shaft of the left radius. An additional chronic fracture of the left ulnar styloid is noted. Marked severity degenerative changes are noted throughout the left wrist with a chronic deformity is seen involving the left scaphoid bone. There is mild diffuse soft tissue swelling. IMPRESSION: 1. Marked severity degenerative changes without an acute fracture or dislocation. 2. Chronic fractures of the distal left radius and left ulnar styloid. Electronically Signed   By: Virgina Norfolk M.D.   On: 07/14/2021 00:21  ? ?DG Wrist Complete Right ? ?Result Date: 07/14/2021 ?CLINICAL DATA:  Generalized weakness since being discharged from [facility]  hospital 1 week ago with complaints of bilateral wrist swelling and soreness. EXAM: RIGHT WRIST - COMPLETE 3+ VIEW COMPARISON:  None. FINDINGS: There is no evidence of fracture or dislocation. Mild degenerative changes are seen along the radiocarpal joint and along the carpal metacarpal articulation of the right thumb. Mild diffuse soft tissue swelling is seen. IMPRESSION: No acute osseous  abnormality. Electronically Signed   By: Virgina Norfolk M.D.   On: 07/14/2021 00:23  ? ?CT HEAD WO CONTRAST (5MM) ? ?Result Date: 07/18/2021 ?CLINICAL DATA:  Mental status change, unknown cause EXAM: CT HEAD WITHOUT CONTRAST TECHNIQUE: Contiguous axial images were obtained from the base of the skull through the vertex without intravenous contrast. RADIATION DOSE REDUCTION: This exam was performed according to the departmental dose-optimization program which includes automated exposure control, adjustment of the mA and/or kV according to patient size and/or use of iterative reconstruction technique. COMPARISON:  07/14/2021 FINDINGS: Brain: There is no acute intracranial hemorrhage, mass effect, or edema. Gray-white differentiation is preserved. There is no extra-axial fluid collection. Ventricles and sulci are stable in size and configuration. Patchy low-density in the supratentorial white matter is nonspecific but probably reflects stable chronic microvascular ischemic changes. Vascular: There is atherosclerotic calcification at the skull base. Skull: Calvarium is unremarkable. Sinuses/Orbits: No acute finding. Other: None. IMPRESSION: No acute intracranial abnormality or significant change since recent prior study. Electronically Signed   By: Macy Mis M.D.   On: 07/18/2021 13:30  ? ?CT Head Wo Contrast ? ?Result Date: 07/14/2021 ?CLINICAL DATA:  Generalized weakness since being discharged from [facility] 1 week ago. EXAM: CT HEAD WITHOUT CONTRAST TECHNIQUE: Contiguous axial images were obtained from the base of the skull through the vertex without intravenous contrast. RADIATION DOSE REDUCTION: This exam was performed according to the departmental dose-optimization program which includes automated exposure control, adjustment of the mA and/or kV according to patient size and/or use of iterative reconstruction technique. COMPARISON:  January 07, 2020 FINDINGS: Brain: No evidence of acute infarction,  hemorrhage, hydrocephalus, extra-axial collection or mass lesion/mass effect. Vascular: No hyperdense vessel or unexpected calcification. Skull: Normal. Negative for fracture or focal lesion. Sinuses/Orbits: No acute finding. Other: None. IMPRESSION: 1. No acute intracranial process. 2. Generalized cerebral atrophy with chronic white matter small vessel ischemic changes. Electronically Signed   By: Virgina Norfolk M.D.   On: 07/14/2021 01:51  ? ?CT CHEST WO CONTRAST ? ?Result Date: 07/18/2021 ?CLINICAL DATA:  Chronic short of breath.  New oxygen requirement EXAM: CT CHEST WITHOUT CONTRAST TECHNIQUE: Multidetector CT imaging of the chest was performed following the standard protocol without IV contrast. RADIATION DOSE REDUCTION: This exam was performed according to the departmental dose-optimization program which includes automated exposure control, adjustment of the mA and/or kV according to patient size and/or use of iterative reconstruction technique. COMPARISON:  Chest CT '5 31 22 '$ FINDINGS: Cardiovascular: Coronary artery calcification and aortic atherosclerotic calcification. Mediastinum/Nodes: No axillary or supraclavicular adenopathy. No mediastinal or hilar adenopathy. No pericardial fluid. Esophagus normal. Lungs/Pleura: LEFT lower lobe atelectasis and small effusion. No focal consolidation. No pulmonary edema. No suspicious nodularity. Upper Abdomen: Limited view of the liver, kidneys, pancreas are unremarkable. Normal adrenal glands. Musculoskeletal: No aggressive osseous lesion. IMPRESSION: 1. LEFT pleural effusion and LEFT basilar atelectasis. 2. No pneumonia.  No interstitial lung disease identified Electronically Signed   By: Suzy Bouchard M.D.   On: 07/18/2021 19:53  ? ?DG CHEST PORT 1 VIEW ? ?Result Date: 07/18/2021 ?CLINICAL DATA:  Shortness of breath with weakness. EXAM:  PORTABLE CHEST 1 VIEW COMPARISON:  Chest x-ray July 13, 2021. FINDINGS: Left basilar opacities. No visible pleural effusions  or pneumothorax. Cardiomediastinal silhouette is mildly enlarged. No displaced fracture. IMPRESSION: 1. Left basilar opacities, which could represent atelectasis and/or pneumonia. 2. Mild cardiomegaly. Electronicall

## 2021-07-23 NOTE — Progress Notes (Incomplete)
*  PRELIMINARY RESULTS* ?Echocardiogram ?2D Echocardiogram has been performed. ? ?Carlos Perkins ?07/23/2021, 10:05 AM ?

## 2021-07-23 NOTE — Discharge Summary (Addendum)
?Physician Discharge Summary ?  ?Patient: Carlos Perkins MRN: [id number] DOB: 05/20/1934  ?Admit date:     07/13/2021  ?Discharge date: 07/24/2021  ?Discharge Physician: Shanon Brow Dantre [place]  ? ?PCP: Idelle Crouch, MD  ? ?Recommendations at discharge:  ? Please follow up with primary care provider within 1-2 weeks ? Please repeat BMP and CBC in one week ? ? ? ? ?Hospital Course: ?As per H&P written by Dr. Clearence Ped on 07/14/21 ?Carlos Perkins is a 87 y.o. male with medical history significant of hyperlipidemia, hypertension, myocardial infarction, CHF, paroxysmal atrial fibrillation, and more presents the ED with a chief complaint of generalized weakness.  Patient's family had reported to the ED provider that patient has had a general decline over a couple of months.  He lost his closest living relative 2 months ago, and has been declining since then.  Patient reports that he had a fall 2 weeks ago on Monday.  Since then he thinks he has been getting worse.  He reports he cannot stand up mostly due to weakness, but also pain.  He has sharp pains in his toes when he tries to stand.  He reports the weakness and the pains are equal in the bilateral lower extremities.  He also has sharp pains in his wrist.  His grip strength is significantly decreased because of this.  He reports the symptoms have been present for 2 weeks.  He denies any fatigue.  He reports he is short of breath but its not exertional.  He denies chest pain, hematochezia, melena, fever.  He denies asymmetric weakness, numbness, or hypersensitivity.  He has no other complaints at this time. ?  ?Patient does not smoke, does not drink alcohol, does not use illicit drugs.  He is vaccinated for COVID.  Patient is DNR. ?On 07/18/21 am, patient had altered mental status with increasing lethargy.  Work up was initiated as discussed below. ?  ? ?Assessment and Plan: ?* Generalized weakness ?-multifactorial.indluding symptomatic anemia; infection, low B12,  electrolyte derangements; deconditioning ?-TSH within normal limits ?-B12 significantly low and will be repleted. ?-Gentle fluid resuscitation has been provided and electrolytes have been repleted. ?-CT head without acute intracranial normalities. ?-Ammonia is less than 10 ?-Baseline hemoglobin is 11 ? ?Acute metabolic encephalopathy ?4/19 am--lethargic, but falls asleep without stimulation ?-suspect new infectious process and hypercalcemia ?4/21 corrected calcium 11.7 ?B12--130>>repleted IM ?Folate 10.1>>orally supplementing ?TSH 0.812 ?Ammonia <10 ?-ABG--7.41/46/76/28 2L ?-CT brain--neg for acute findings ?-UA/culture--no pyuria ?-blood culture--staph epi 2/2>>await final>>discussed with Dr. Drucilla Schmidt ?-07/23/21--mental status near baseline--conversant, following commands ?-increase IVF ? ?PAF (paroxysmal atrial fibrillation) ([place]) ?-holding coreg due to soft BPs ?-Currently in A-fib, rate controlled ?-Continue telemetry monitoring ?-Holding anticoagulation in the setting of positive fecal occult blood test and need for endoscopic evaluation. ?-EGD/colon unrevealing>>restart apixaban 4/24 ? 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 ?Study is incomplete as capsule did not reach cecum ?Discussed with Dr. Trula Ore apixaban 4/24 ? ?Sepsis ([place]) ?4/19 am--fever 102.7 with tachypnea ?Due to staph epi bacteremia ?-check PCT <0.10 ?-lactate--1.5 ?-blood culture GPC one of two ?-CXR--personally reviewed>>L-base opacity ?-CT chest LLL atelectasis with L-effusion ?-UA/culture--urine culture =  providencia ?-continue empiric zosyn>>unasyn ?-MRSA screen  neg ? ?Coag negative Staphylococcus bacteremia ?Staph epi bacteremia--2/2 sets OX sensitive ?Continue unasyn ?Repeat blood culture--neg to date ?Echo--07/23/21 EF 40%, global HK, RVSP 50.0, mild MR/TR ?Received 7 days IV abx ?Repeat blood cultures neg to date ?D/c home with cephalexin x 7 more days ? ?Chronic HFrEF (heart failure with reduced ejection fraction) ([place]) ?-holding Lasix, Coreg, ACE and spironolactone as an outpatient>>resume after d/c ?-appears clinically euvolemic ?-Continue to follow daily weights and strict I's and O's. ?07/23/21 EF 40%, global HK, RVSP 50.0, mild MR/TR ? ?Hypokalemia ?-Potassium 3.4 at time of admission ?-Repleted and within normal limits currently. ? ?Goals of care, counseling/discussion ?Family confirms DNR ?Treat the treatable for now, plan to take home with hospice once stable ? ?Prostate cancer metastatic to bone [facility]) ?-Continue outpatient follow-up with urology/oncology ?-Currently taking Zytiga, which is on hold at this time>>restart after d/c ? ? ?Hypertension ?-Continue Coreg and ACE initially>>hold now as pt cannot take po due to lethargy and soft BPs ?-Continue to follow vital signs. ?-BPs remain acceptable/controlled ? ?Hyperlipidemia, unspecified ?-Continue statin ? ?Hypercalcemia ?Due to prostate cancer with mets to bone ?4/21 corrected calcium = 12.4>>11.6>>10.9 ?Give one dose bisphosphonate 4/21 ?Continue IVF ? ? ? ? ?  ? ?Consultants: GI, palliative ?Procedures performed: none  ?Disposition: Home ?Diet recommendation:  ?Cardiac diet ?DISCHARGE MEDICATION: ?Allergies as of 07/24/2021   ?No Known Allergies ?  ? ?  ?Medication List  ?  ? ?STOP taking these medications   ? ?cholecalciferol 1000 units tablet ?Commonly known as: VITAMIN D ?  ? ?  ? ?TAKE these medications   ? ?abiraterone acetate 250 MG tablet ?Commonly known as: ZYTIGA ?TAKE 4 TABLETS (1,000 MG TOTAL) BY MOUTH DAILY. TAKE ON AN EMPTY STOMACH 1 HOUR  BEFORE OR 2 HOURS AFTER A MEAL ?What changed: how much to take ?  ?apixaban 5 MG Tabs tablet ?Commonly known as: ELIQUIS ?Take 5 mg by mouth 2 (two) times daily. ?  ?atorvastatin 10 MG tablet ?Commonly known as: LIPITOR ?Take 10 mg by mouth daily. ?  ?carvedilol 6.25 MG tablet ?Commonly known as: COREG ?Take 4 tablets (25 mg total) by mouth 2 (two) times daily with a meal. ?What changed: medication strength ?  ?cephALEXin 500 MG capsule ?Commonly known as: KEFLEX ?Take 1 capsule (500 mg total) by mouth every 8 (eight) hours. ?  ?cinacalcet 30 MG tablet ?Commonly known as: SENSIPAR ?Take 30 mg by mouth 4 (four) times a week. Monday/Wednesday/Saturday/Sunday ?  ?ferrous sulfate 325 (65 FE) MG tablet ?Take 1 tablet (325 mg total) by mouth daily with breakfast. ?  ?fluticasone-salmeterol 250-50 MCG/ACT Aepb ?Commonly known as: ADVAIR ?Inhale 1 puff into the lungs in the morning and at bedtime. ?  ?folic acid 1 MG tablet ?Commonly known as: FOLVITE ?Take 1 tablet (1 mg total) by mouth daily. ?  ?furosemide 20 MG tablet ?Commonly known as: LASIX ?Take 40 mg by mouth daily. ?  ?lisinopril 5 MG tablet ?Commonly known as: ZESTRIL ?Take 8 tablets (40 mg total) by mouth daily. ?What changed: medication strength ?  ?nitroGLYCERIN 0.4 MG SL tablet ?Commonly known as: NITROSTAT ?Place 0.4 mg under the tongue every 5 (five) minutes as needed for chest pain. ?  ?predniSONE 5 MG tablet ?Commonly known as: DELTASONE ?Take 1 tablet by mouth once daily with breakfast ?  ?spironolactone 25 MG tablet ?Commonly known as: ALDACTONE ?Take 12.5 mg by mouth daily. ?  ?vitamin B-12 500 MCG tablet ?Commonly known as: CYANOCOBALAMIN ?Take 1 tablet (500 mcg  total) by mouth daily. ?  ? ?  ? ?  ?  ? ? ?  ?Durable Medical Equipment  ?(From admission, onward)  ?  ? ? ?  ? ?  Start     Ordered  ? 07/17/21 1223  For home use only DME Hospital bed  Once       ?Question Answer Comment  ?Length of Need 12 Months   ?The above medical condition requires:  Patient requires the ability to reposition frequently   ?Head must be elevated greater than: 30 degrees   ?Bed type Semi-electric   ?[place] Yes   ?  ? 07/17/21 1222  ? 07/14/21 1430  For home use only DME Bedsi

## 2021-07-23 NOTE — Progress Notes (Addendum)
?  ?       ?PROGRESS NOTE ? ?Carlos Perkins QMG:[id number] DOB: 1934-04-09 DOA: 07/13/2021 ?PCP: Idelle Crouch, MD ? ?Brief History:  ?As per H&P written by Dr. Clearence Ped on 07/14/21 ?Carlos Perkins is a 87 y.o. male with medical history significant of hyperlipidemia, hypertension, myocardial infarction, CHF, paroxysmal atrial fibrillation, and more presents the ED with a chief complaint of generalized weakness.  Patient's family had reported to the ED provider that patient has had a general decline over a couple of months.  He lost his closest living relative 2 months ago, and has been declining since then.  Patient reports that he had a fall 2 weeks ago on Monday.  Since then he thinks he has been getting worse.  He reports he cannot stand up mostly due to weakness, but also pain.  He has sharp pains in his toes when he tries to stand.  He reports the weakness and the pains are equal in the bilateral lower extremities.  He also has sharp pains in his wrist.  His grip strength is significantly decreased because of this.  He reports the symptoms have been present for 2 weeks.  He denies any fatigue.  He reports he is short of breath but its not exertional.  He denies chest pain, hematochezia, melena, fever.  He denies asymmetric weakness, numbness, or hypersensitivity.  He has no other complaints at this time. ?  ?Patient does not smoke, does not drink alcohol, does not use illicit drugs.  He is vaccinated for COVID.  Patient is DNR. ?On 07/18/21 am, patient had altered mental status with increasing lethargy.  Work up was initiated as discussed below. ?   ? ? ? ?Assessment and Plan: ?* Generalized weakness ?-multifactorial.indluding symptomatic anemia; infection, low B12, electrolyte derangements; deconditioning ?-TSH within normal limits ?-B12 significantly low and will be repleted. ?-Gentle fluid resuscitation has been provided and electrolytes have been repleted. ?-CT head without acute  intracranial normalities. ?-Ammonia is less than 10 ?-Baseline hemoglobin is 11 ? ?Acute metabolic encephalopathy ?4/19 am--lethargic, but falls asleep without stimulation ?-suspect new infectious process and hypercalcemia ?4/21 corrected calcium 11.7 ?B12--130>>repleted IM ?Folate 10.1>>orally supplementing ?TSH 0.812 ?Ammonia <10 ?-ABG--7.41/46/76/28 2L ?-CT brain--neg for acute findings ?-UA/culture--no pyuria ?-blood culture--staph epi 2/2>>await final>>discussed with Dr. Drucilla Schmidt ?-07/23/21--mental status near baseline--conversant, following commands ?-increase IVF ? ?SIRS (systemic inflammatory response syndrome) (HCC) ?4/19 am--fever 102.7 with tachypnea ?-check PCT <0.10 ?-lactate--1.5 ?-blood culture GPC one of two ?-CXR--personally reviewed>>L-base opacity ?-CT chest LLL atelectasis with L-effusion ?-UA/culture--urine culture = providencia ?-continue empiric zosyn>>unasyn ?-MRSA screen neg ? ? ?PAF (paroxysmal atrial fibrillation) ([place]) ?-holding coreg due to soft BPs ?-Currently in A-fib, rate controlled ?-Continue telemetry monitoring ?-Holding anticoagulation in the setting of positive fecal occult blood test and need for endoscopic evaluation. ?-EGD/colon unrevealing>>restart apixaban 4/24 ? 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 ?Study is incomplete as capsule did not reach cecum ?Discussed with Dr. Trula Ore apixaban 4/24 ? ?Coag negative Staphylococcus bacteremia ?Staph epi bacteremia--2/2 sets OX sensitive ?Continue unasyn ?Repeat blood culture--neg to date ?Echo--07/23/21 EF  40%, global HK, RVSP 50.0, mild MR/TR ? ?Chronic HFrEF (heart failure with reduced ejection fraction) (  [place]) ?-holding Lasix, Coreg, ACE and spironolactone as an outpatient>>resume after d/c ?-appears clinically euvolemic ?-Continue to follow daily weights and strict I's and O's. ?07/23/21 EF 40%, global HK, RVSP 50.0, mild MR/TR ? ?Hypokalemia ?-Potassium 3.4 at time of admission ?-Repleted and within normal limits currently. ? ?Goals of care, counseling/discussion ?Family confirms DNR ?Treat the treatable for now, plan to take home with hospice once stable ? ?Prostate cancer metastatic to bone [facility]) ?-Continue outpatient follow-up with urology/oncology ?-Currently taking Zytiga, which is on hold at this time>>restart after d/c ? ? ?Hypertension ?-Continue Coreg and ACE initially>>hold now as pt cannot take po due to lethargy and soft BPs ?-Continue to follow vital signs. ?-BPs remain acceptable/controlled ? ?Hyperlipidemia, unspecified ?-Continue statin ? ?Hypercalcemia ?Due to prostate cancer with mets to bone ?4/21 corrected calcium = 12.4>>11.6>>10.9 ?Give one dose bisphosphonate 4/21 ?Continue IVF ? ? ? ? ? ?Family Communication:   Family at bedside 4/24 ? ?Consultants:  palliative ? ?Code Status:   DNR ? ?DVT Prophylaxis:  apixaban ? ? ?Procedures: ?As Listed in Progress Note Above ? ?Antibiotics: ?Zosyn 4/19>>4/22 ?Unasyn 4/22>> ?  ? ? ? ?Subjective: ? ?Patient is more alert today.  Patient denies fevers, chills, headache, chest pain, dyspnea, nausea, vomiting, diarrhea, abdominal pain, dysuria, hematuria, hematochezia, and melena. ? ?Objective: ?Vitals:  ? 07/22/21 1424 07/22/21 2131 07/23/21 0520 07/23/21 1401  ?BP: 130/80 126/90 122/87 (!) 132/107  ?Pulse: 88 80 (!) 42 (!) 56  ?Resp: '20 20 16 19  '$ ?Temp: 97.9 ?F (36.6 ?C) 98.2 ?F (36.8 ?C) 98.6 ?F (37 ?C) (!) 97.5 ?F (36.4 ?C)  ?TempSrc: Oral Oral  Oral  ?SpO2: 100% 99% 100% 100%  ?Weight:   97.9 kg   ?Height:      ? ? ?Intake/Output Summary (Last 24 hours)  at 07/23/2021 1742 ?Last data filed at 07/23/2021 1733 ?Gross per 24 hour  ?Intake 2040.26 ml  ?Output 1500 ml  ?Net 540.26 ml  ? ?Weight change: 1.1 kg ?Exam: ? ?General:  Pt is alert, follows commands appropriately, not in acute distress ?HEENT: No icterus, No thrush, No neck mass, /AT ?Cardiovascular: RRR, S1/S2, no rubs, no gallops ?Respiratory: CTA bilaterally, no wheezing, no crackles, no rhonchi ?Abdomen: Soft/+BS, non tender, non distended, no guarding ?Extremities: No edema, No lymphangitis, No petechiae, No rashes, no synovitis ? ? ?Data Reviewed: ?I have personally reviewed following labs and imaging studies ?Basic Metabolic Panel: ?Recent Labs  ?Lab 07/18/21 ?0926 07/19/21 ?9509 07/20/21 ?3267 07/21/21 ?0603 07/22/21 ?1245 07/23/21 ?0518  ?NA 136 139 142 141 140 143  ?K 3.8 3.3* 4.4 3.6 4.0 3.9  ?CL 104 107 108 109 110 111  ?CO2 '26 28 29 27 26 28  '$ ?GLUCOSE 126* 138* 110* 113* 104* 111*  ?BUN '23 22 17 16 15 13  '$ ?CREATININE 1.08 1.25* 0.90 0.93 0.96 0.90  ?CALCIUM 10.2 10.4* 10.9* 10.2 9.5 9.5  ?MG 2.1 2.2 2.1  --  1.9  --   ? ?Liver Function Tests: ?Recent Labs  ?Lab 07/18/21 ?0926 07/20/21 ?8099 07/21/21 ?0603 07/22/21 ?8338 07/23/21 ?0518  ?AST 81* 36 32 58* 36  ?ALT 57* 41 36 55* 48*  ?ALKPHOS 87 72 71 70 72  ?BILITOT 0.9 0.3 0.3 0.3 0.4  ?PROT 6.1* 5.5* 5.4* 5.3* 5.3*  ?ALBUMIN 2.4* 2.1* 2.2* 2.2* 2.2*  ? ?No results for input(s): LIPASE, AMYLASE in the last 168 hours. ?No results for input(s): AMMONIA in the last 168 hours. ?Coagulation Profile: ?No results for input(s): INR, PROTIME in the  last 168 hours. ?CBC: ?Recent Labs  ?Lab 07/19/21 ?7622 07/20/21 ?6333 07/21/21 ?0603 07/22/21 ?5456 07/23/21 ?0518  ?WBC 3.3* 5.4 6.1 6.5 5.6  ?HGB 8.8* 8.6* 8.9* 8.4* 8.8*  ?HCT 28.4* 28.5* 28.3* 27.4* 28.8*  ?MCV 91.6 91.9 91.0 91.0 90.9  ?PLT 393 411* 403* 390 340  ? ?Cardiac Enzymes: ?No results for input(s): CKTOTAL, CKMB, CKMBINDEX, TROPONINI in the last 168 hours. ?BNP: ?Invalid input(s):  POCBNP ?CBG: ?Recent Labs  ?Lab 07/16/21 ?2109 07/17/21 ?2563 07/17/21 ?8937 07/17/21 ?2109 07/18/21 ?1019  ?GLUCAP 122* 107* 109* 155* 110*  ? ?HbA1C: ?No results for input(s): HGBA1C in the last 72 hours. ?Urine analysis: ?

## 2021-07-24 ENCOUNTER — Other Ambulatory Visit (HOSPITAL_COMMUNITY): Payer: Self-pay

## 2021-07-24 DIAGNOSIS — R7881 Bacteremia: Secondary | ICD-10-CM | POA: Diagnosis not present

## 2021-07-24 DIAGNOSIS — G9341 Metabolic encephalopathy: Secondary | ICD-10-CM | POA: Diagnosis not present

## 2021-07-24 DIAGNOSIS — I5022 Chronic systolic (congestive) heart failure: Secondary | ICD-10-CM | POA: Diagnosis not present

## 2021-07-24 DIAGNOSIS — R531 Weakness: Secondary | ICD-10-CM | POA: Diagnosis not present

## 2021-07-24 MED ORDER — CARVEDILOL 6.25 MG PO TABS: 25.0000 mg | ORAL_TABLET | Freq: Two times a day (BID) | ORAL | 1 refills | Status: AC

## 2021-07-24 MED ORDER — CEPHALEXIN 500 MG PO CAPS: 500.0000 mg | ORAL_CAPSULE | Freq: Three times a day (TID) | ORAL | Status: DC

## 2021-07-24 MED ORDER — CEPHALEXIN 500 MG PO CAPS: 500.0000 mg | ORAL_CAPSULE | Freq: Three times a day (TID) | ORAL | 0 refills | Status: AC

## 2021-07-24 MED ORDER — LISINOPRIL 5 MG PO TABS: 40.0000 mg | ORAL_TABLET | Freq: Every day | ORAL | 1 refills | Status: AC

## 2021-07-24 NOTE — Progress Notes (Signed)
Ng Discharge Note ? ?Admit Date:  07/13/2021 ?Discharge date: 07/24/2021 ?  ?Sena Judye Bos to be D/C'd Home per MD order.  AVS completed. ?Patient/caregiver able to verbalize understanding. ? ?Discharge Medication: ?Allergies as of 07/24/2021   ?No Known Allergies ?  ? ?  ?Medication List  ?  ? ?STOP taking these medications   ? ?cholecalciferol 1000 units tablet ?Commonly known as: VITAMIN D ?  ? ?  ? ?TAKE these medications   ? ?abiraterone acetate 250 MG tablet ?Commonly known as: ZYTIGA ?TAKE 4 TABLETS (1,000 MG TOTAL) BY MOUTH DAILY. TAKE ON AN EMPTY STOMACH 1 HOUR BEFORE OR 2 HOURS AFTER A MEAL ?What changed: how much to take ?  ?apixaban 5 MG Tabs tablet ?Commonly known as: ELIQUIS ?Take 5 mg by mouth 2 (two) times daily. ?  ?atorvastatin 10 MG tablet ?Commonly known as: LIPITOR ?Take 10 mg by mouth daily. ?  ?carvedilol 6.25 MG tablet ?Commonly known as: COREG ?Take 4 tablets (25 mg total) by mouth 2 (two) times daily with a meal. ?What changed: medication strength ?  ?cephALEXin 500 MG capsule ?Commonly known as: KEFLEX ?Take 1 capsule (500 mg total) by mouth every 8 (eight) hours. ?  ?cinacalcet 30 MG tablet ?Commonly known as: SENSIPAR ?Take 30 mg by mouth 4 (four) times a week. Monday/Wednesday/Saturday/Sunday ?  ?ferrous sulfate 325 (65 FE) MG tablet ?Take 1 tablet (325 mg total) by mouth daily with breakfast. ?  ?fluticasone-salmeterol 250-50 MCG/ACT Aepb ?Commonly known as: ADVAIR ?Inhale 1 puff into the lungs in the morning and at bedtime. ?  ?folic acid 1 MG tablet ?Commonly known as: FOLVITE ?Take 1 tablet (1 mg total) by mouth daily. ?  ?furosemide 20 MG tablet ?Commonly known as: LASIX ?Take 40 mg by mouth daily. ?  ?lisinopril 5 MG tablet ?Commonly known as: ZESTRIL ?Take 8 tablets (40 mg total) by mouth daily. ?What changed: medication strength ?  ?nitroGLYCERIN 0.4 MG SL tablet ?Commonly known as: NITROSTAT ?Place 0.4 mg under the tongue every 5 (five) minutes as needed for chest pain. ?   ?predniSONE 5 MG tablet ?Commonly known as: DELTASONE ?Take 1 tablet by mouth once daily with breakfast ?  ?spironolactone 25 MG tablet ?Commonly known as: ALDACTONE ?Take 12.5 mg by mouth daily. ?  ?vitamin B-12 500 MCG tablet ?Commonly known as: CYANOCOBALAMIN ?Take 1 tablet (500 mcg total) by mouth daily. ?  ? ?  ? ?  ?  ? ? ?  ?Durable Medical Equipment  ?(From admission, onward)  ?  ? ? ?  ? ?  Start     Ordered  ? 07/17/21 1223  For home use only DME Hospital bed  Once       ?Question Answer Comment  ?Length of Need 12 Months   ?The above medical condition requires: Patient requires the ability to reposition frequently   ?Head must be elevated greater than: 30 degrees   ?Bed type Semi-electric   ?Trapeze Bar Yes   ?  ? 07/17/21 1222  ? 07/14/21 1430  For home use only DME Bedside commode  Once       ?Question:  Patient needs a bedside commode to treat with the following condition  Answer:  Physical deconditioning  ? 07/14/21 1430  ? ?  ?  ? ?  ? ? ?Discharge Assessment: ?Vitals:  ? 07/23/21 2127 07/24/21 0419  ?BP: (!) 128/95 132/88  ?Pulse: 100 82  ?Resp: 20 20  ?Temp: 98.7 ?F (37.1 ?C) 97.6 ?  F (36.4 ?C)  ?SpO2: 99% 100%  ? Skin clean, dry and intact without evidence of skin break down, no evidence of skin tears noted. ?IV catheter discontinued intact. Site without signs and symptoms of complications - no redness or edema noted at insertion site, patient denies c/o pain - only slight tenderness at site.  Dressing with slight pressure applied. ? ?D/c Instructions-Education: ?Discharge instructions given to patient/family with verbalized understanding. ?D/c education completed with patient/family including follow up instructions, medication list, d/c activities limitations if indicated, with other d/c instructions as indicated by MD - patient able to verbalize understanding, all questions fully answered. ?Patient instructed to return to ED, call 911, or call MD for any changes in condition.  ?Patient escorted  via [place], and D/C home via private auto. ? ?Tsosie Billing, LPN ?11/11/8869 9:59 PM   ?

## 2021-07-24 NOTE — Care Management Important Message (Signed)
Important Message ? ?Patient Details  ?Name: Carlos Perkins ?MRN: [id number] ?Date of Birth: 03-Feb-1935 ? ? ?Medicare Important Message Given:  Yes ? ? ? ? ?Tommy Medal ?07/24/2021, 12:39 PM ?

## 2021-07-24 NOTE — Progress Notes (Signed)
Chaplain engaged in an initial visit with Carlos Perkins and his two daughters.  Carlos Perkins expressed a great deal of happiness in being able to go home.  He is looking forward to being back in his living room and kitchen.  His children are also grateful to get him home and will be staying with him.  Chaplain affirmed his good news and offered a blessing of peace and comfort.   ? ?Chaplain offered a compassionate presence, support, and listening.  ? ? ? 07/24/21 1100  ?Clinical Encounter Type  ?Visited With Patient and family together  ?Visit Type Initial;Spiritual support  ? ? ?

## 2021-07-24 NOTE — TOC Transition Note (Addendum)
Transition of Care (TOC) - CM/SW Discharge Note ? ? ?Patient Details  ?Name: Carlos Perkins ?MRN: [id number] ?Date of Birth: [birth year] ? ?Transition of Care (TOC) CM/SW Contact:  ?Boneta Lucks, RN ?Phone Number: ?07/24/2021, 11:29 AM ? ? ?Clinical Narrative:   Patient discharging today. Will go home with his son to  [address] [place], [place] [address]. His son will come to transport him.  [name] with [facility] updated and fax information requested. ? ?Final next level of care: [place] ?Barriers to Discharge: Barriers Resolved ? ? ?Patient Goals and CMS Choice ?Patient states their goals for this hospitalization and ongoing recovery are:: Home with HH ?CMS Medicare.gov Compare Post Acute Care list provided to:: Patient ?Choice offered to / list presented to : Patient, [facility] POA / Guardian, Adult Children ? ?Discharge Placement ?  ?           ?  ?Patient to be transferred to facility by: SON ?Name of family member notified: SON ?Patient and family notified of of transfer: 07/24/21 ? ?Discharge Plan and Services ?In-house Referral: Clinical Social Work ?  ?Post Acute Care Choice: Home Health, Durable Medical Equipment          ?DME Arranged: Hospital bed ?DME Agency: [facility] ?Date DME Agency Contacted: 07/17/21 ?Time DME Agency Contacted: 1211 ?Representative spoke with at DME Agency: main referral line ?  ?  ?  ?  ? ?  07/24/2021  ? 11:27 AM  ?Readmission Risk Prevention Plan  ?Transportation Screening Complete  ?[place] or Home Care Consult Complete  ?Social Work Consult for [place] Planning/Counseling Complete  ?Palliative Care Screening Complete  ?Medication Review Press photographer) Complete  ? ? ? ? ? ?

## 2021-07-27 LAB — CULTURE, BLOOD (ROUTINE X 2)
Culture: NO GROWTH
Culture: NO GROWTH
Special Requests: ADEQUATE
Special Requests: ADEQUATE

## 2021-07-27 LAB — PTH-RELATED PEPTIDE: PTH-related peptide: <2 pmol/L

## 2021-07-30 DEATH — deceased

## 2023-08-16 IMAGING — CT CT HEAD W/O CM
4 of 8 series · 16 of 47 positions shown, 18 images · non-contrast
Comparison: 07/14/2021

CLINICAL DATA: Mental status change, unknown cause



[Series 2: head bone · axial · 0.46mm/px · z∈[+50,+126]mm · 5 of 76 slices shown]
[im 5/76  bone]
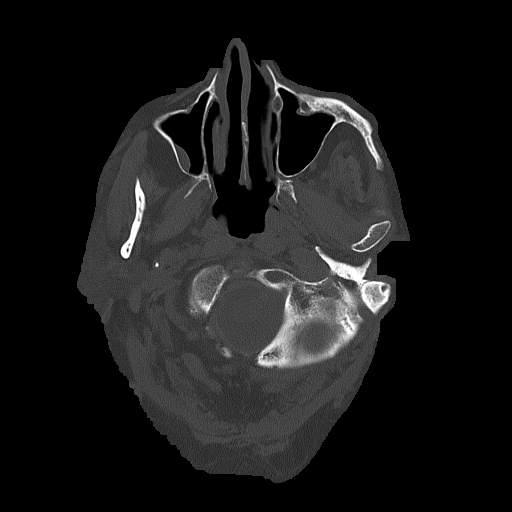
[im 15/76  bone]
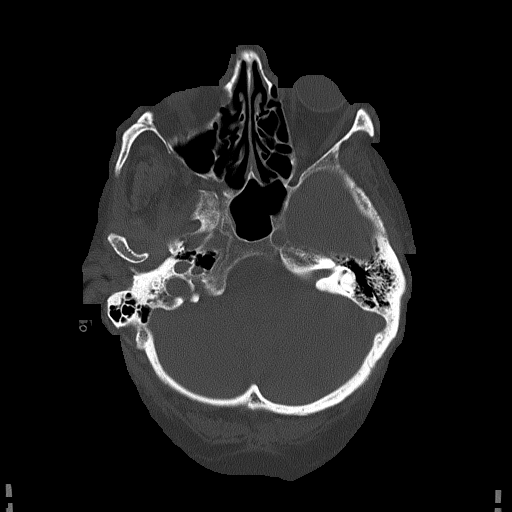
[im 24/76  bone]
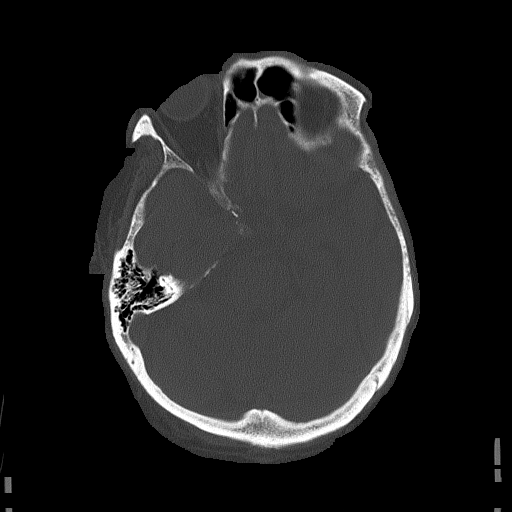
[im 33/76  bone]
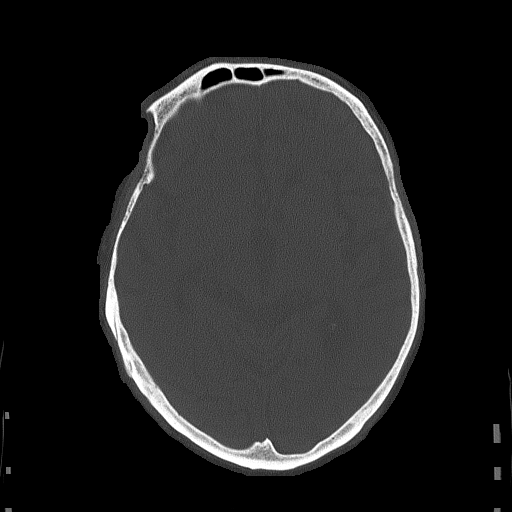
[im 43/76  bone]
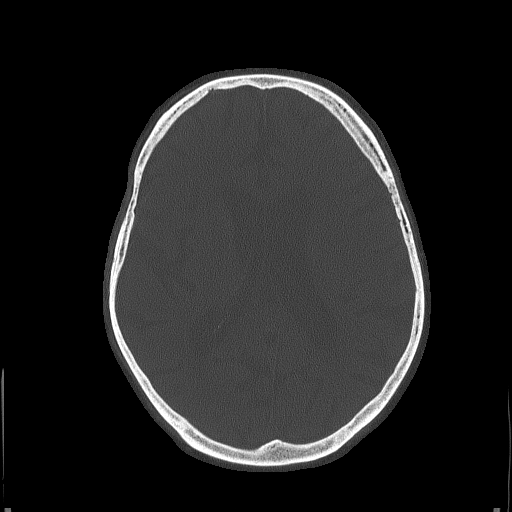

[Series 3: head w o · axial · 0.46mm/px · z∈[+67,+167]mm · 5 of 31 slices shown, 7 images]
[im 6/31  brain]
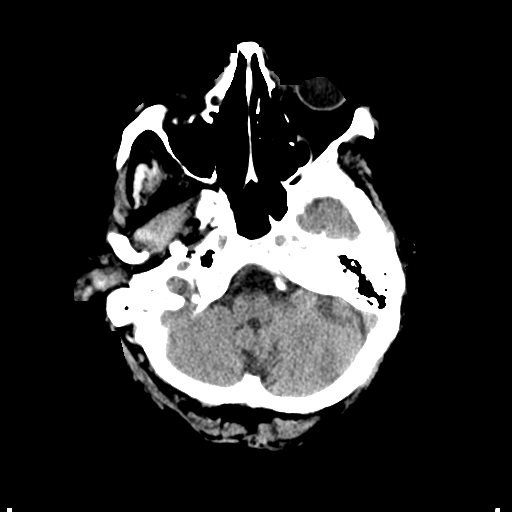
[im 6/31  bone]
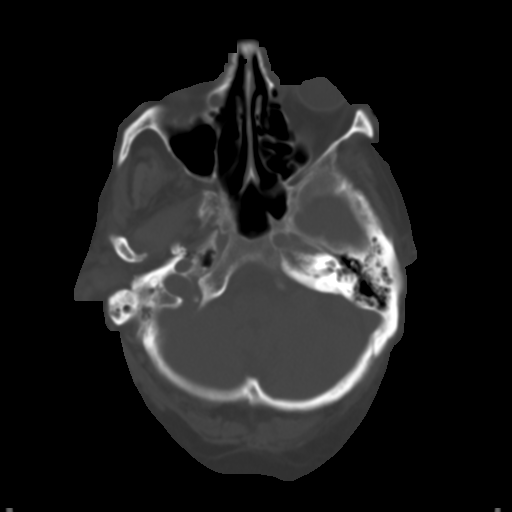
[im 11/31  brain]
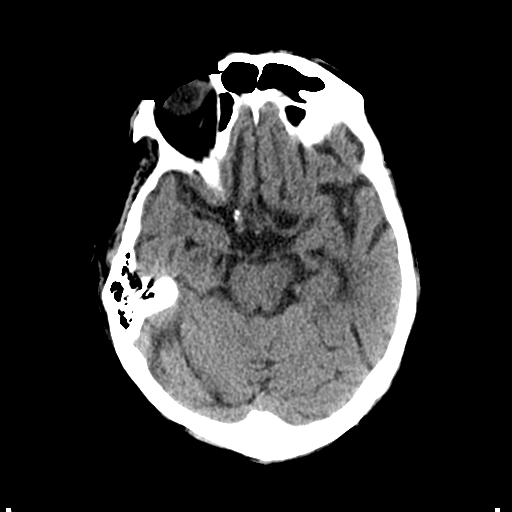
[im 16/31  brain]
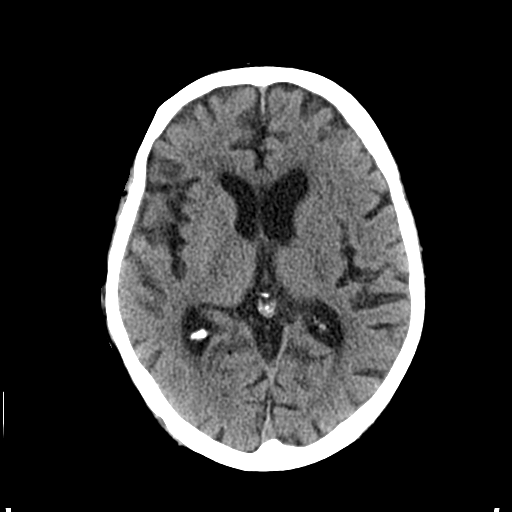
[im 21/31  brain]
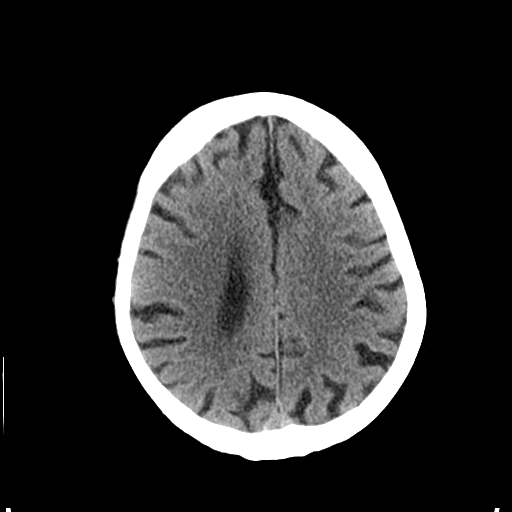
[im 26/31  brain]
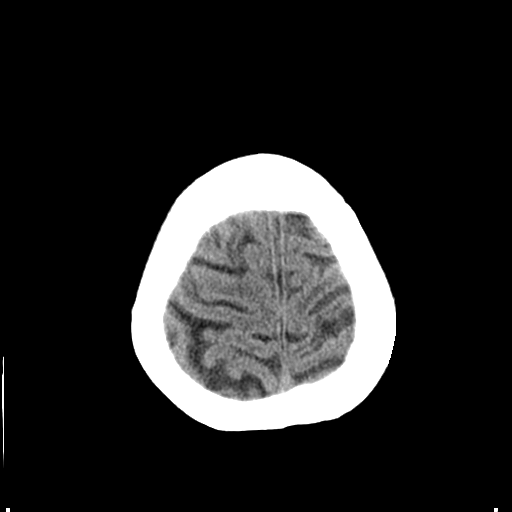
[im 26/31  bone]
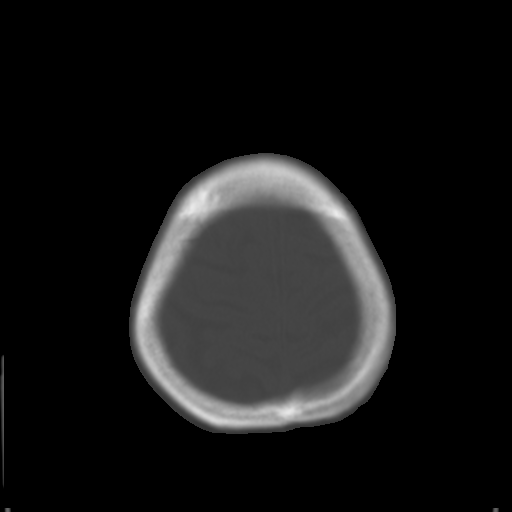

[Series 4: coronal soft · coronal · 0.32mm/px · 3 of 73 slices shown]
[im 15/73  brain]
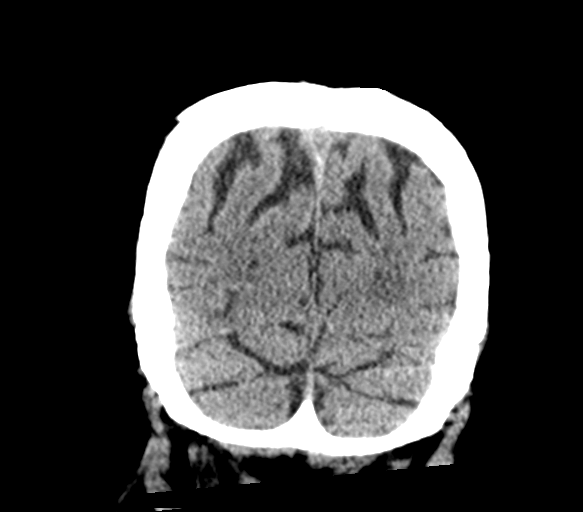
[im 29/73  brain]
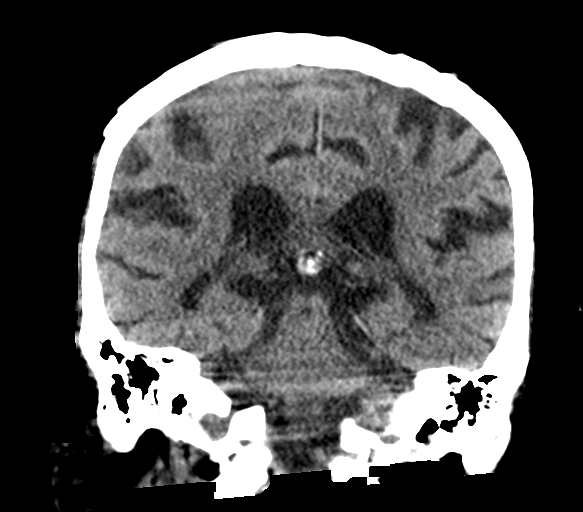
[im 44/73  brain]
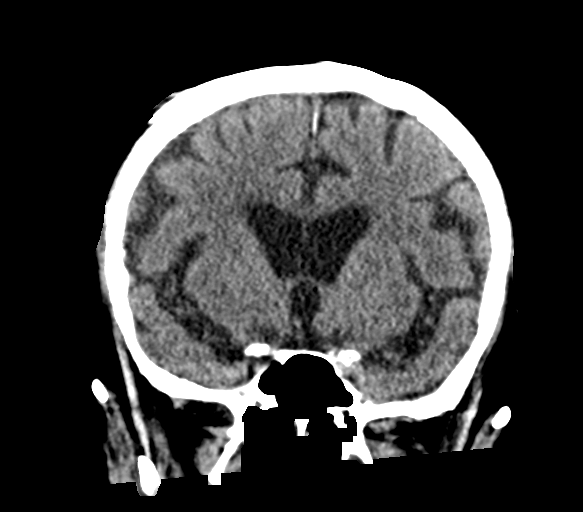

[Series 5: sagittal soft · sagittal · 0.32mm/px · 3 of 68 slices shown]
[im 8/68  brain]
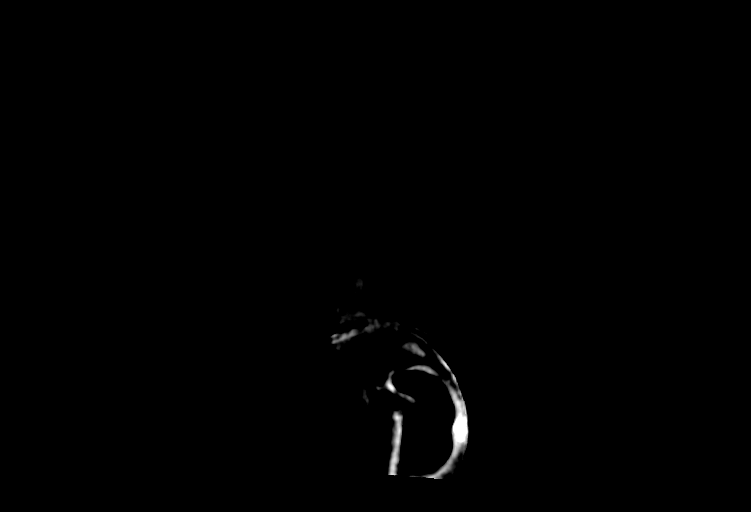
[im 28/68  brain]
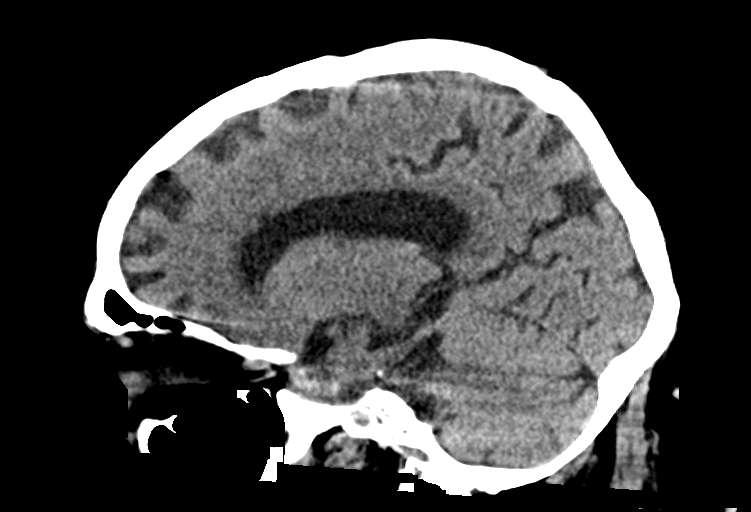
[im 48/68  brain]
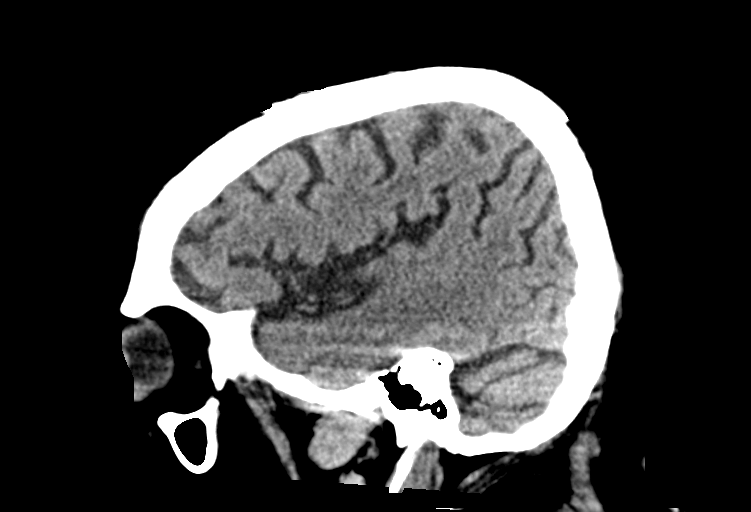

[16 of 47 positions shown; findings below may reference images not displayed]

FINDINGS: Brain: There is no acute intracranial hemorrhage, mass effect, or
edema. Gray-white differentiation is preserved. There is no
extra-axial fluid collection. Ventricles and sulci are stable in
size and configuration. Patchy low-density in the supratentorial
white matter is nonspecific but probably reflects stable chronic
microvascular ischemic changes.

Vascular: There is atherosclerotic calcification at the skull base.

Skull: Calvarium is unremarkable.

Sinuses/Orbits: No acute finding.

Other: None.
IMPRESSION: No acute intracranial abnormality or significant change since recent
prior study.

## 2023-08-17 IMAGING — US US ABDOMEN LIMITED
1 series · 14 of 25 positions shown · non-contrast
Comparison: CT abdomen pelvis 04/16/2021

CLINICAL DATA: Transaminitis.  History of cholecystectomy.

EXAM:
ULTRASOUND ABDOMEN LIMITED RIGHT UPPER QUADRANT

[Series 1: us abdomen limited ruq (liver/gb) · 14 of 47 slices shown]
[im 1/47]
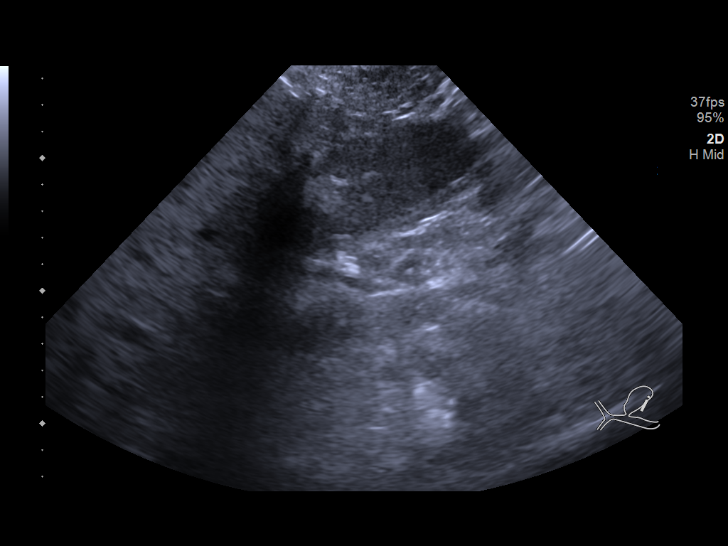
[im 4/47]
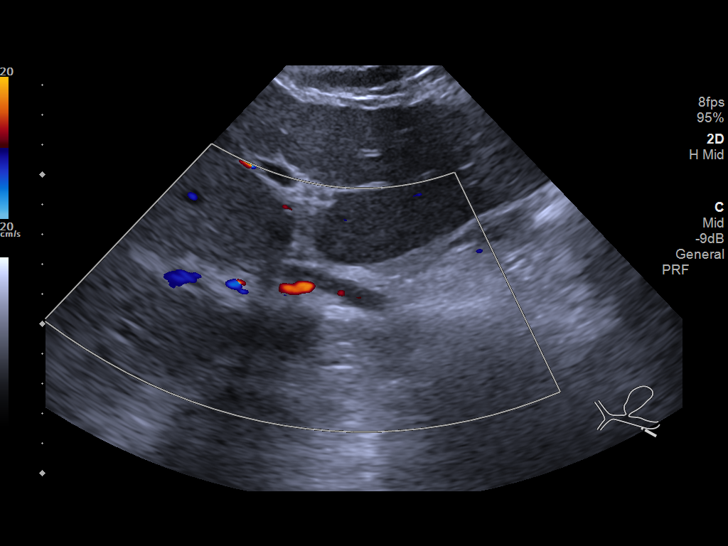
[im 8/47]
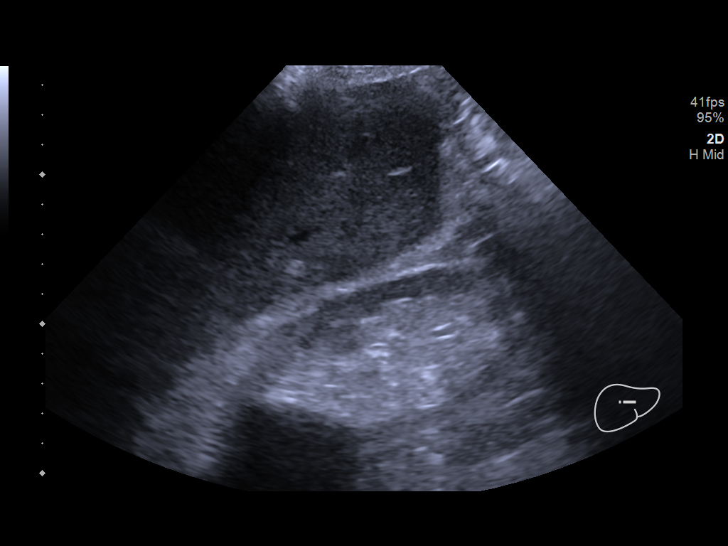
[im 12/47]
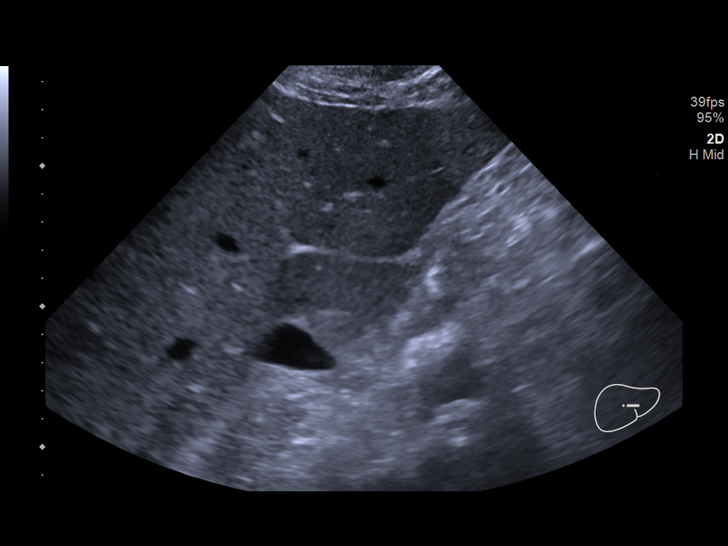
[im 16/47]
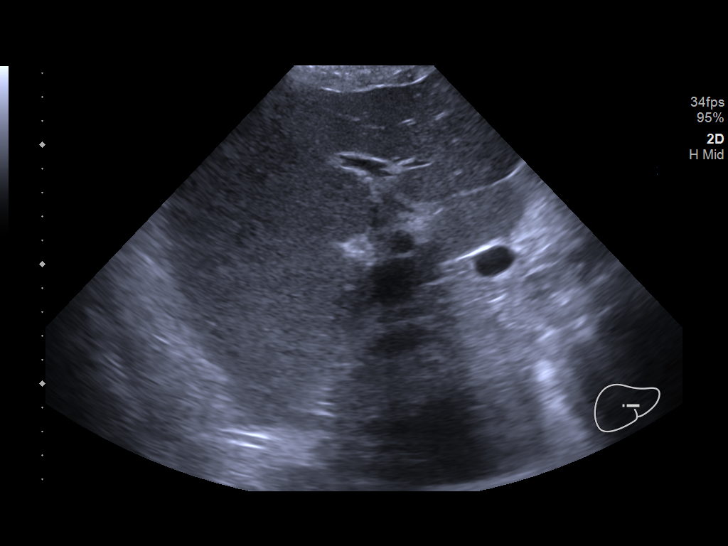
[im 18/47]
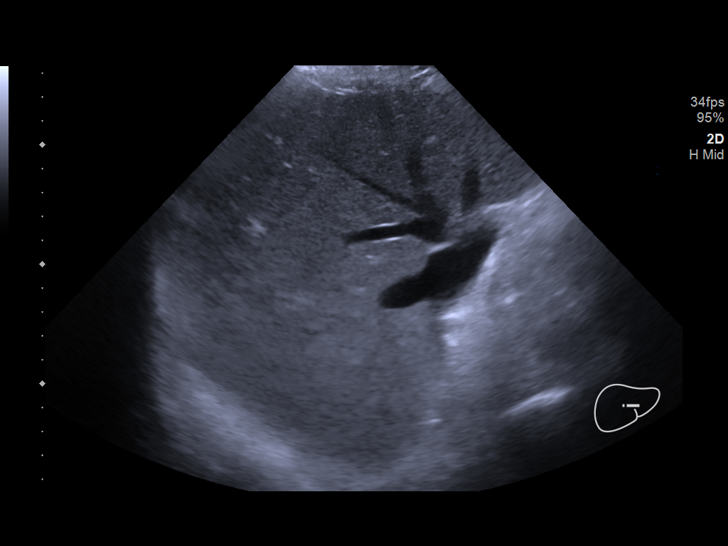
[im 22/47]
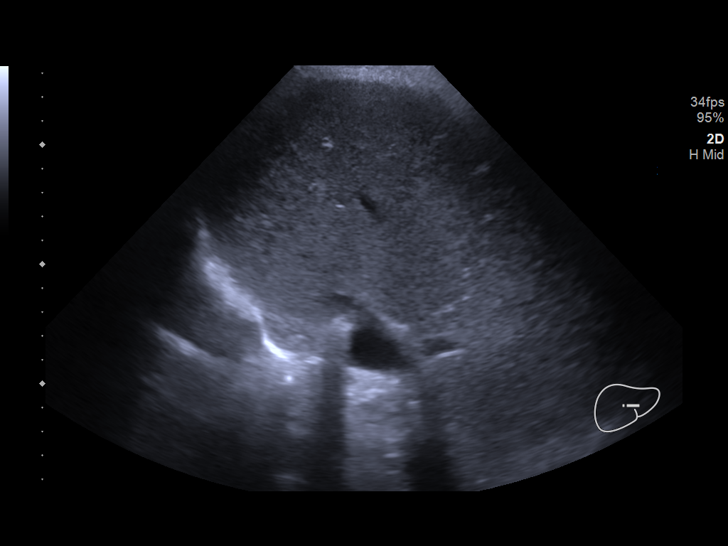
[im 25/47]
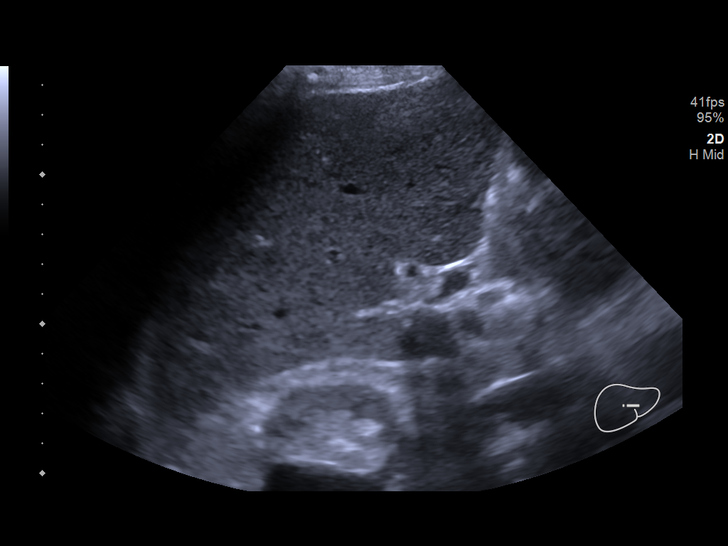
[im 29/47]
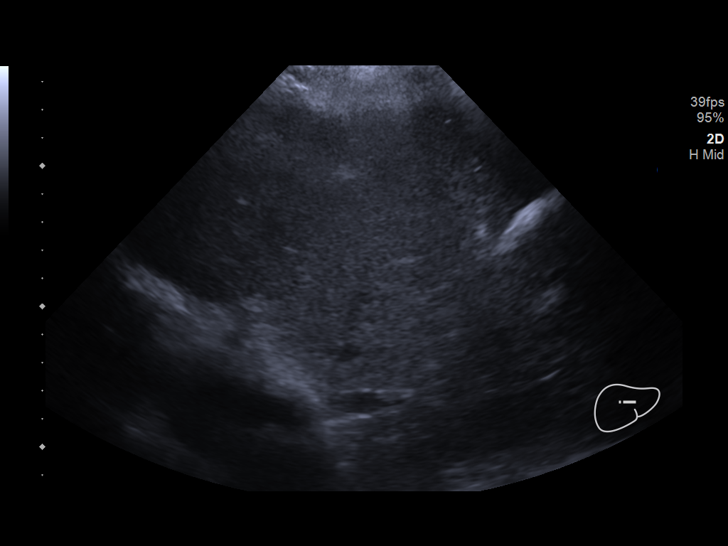
[im 31/47]
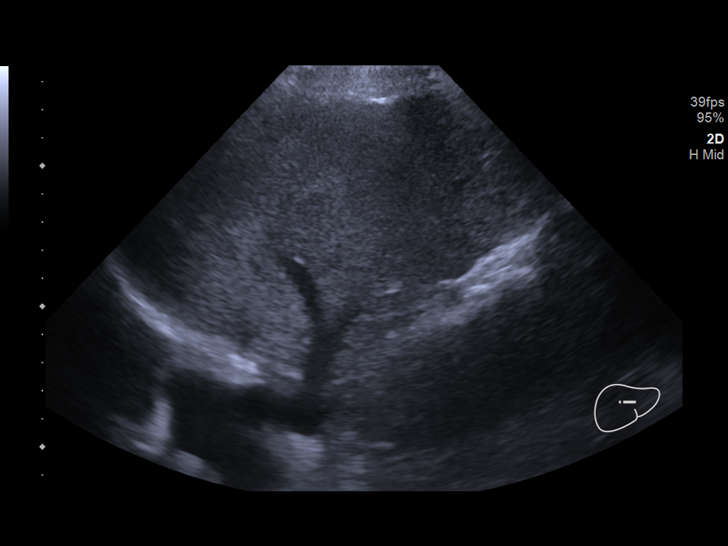
[im 35/47]
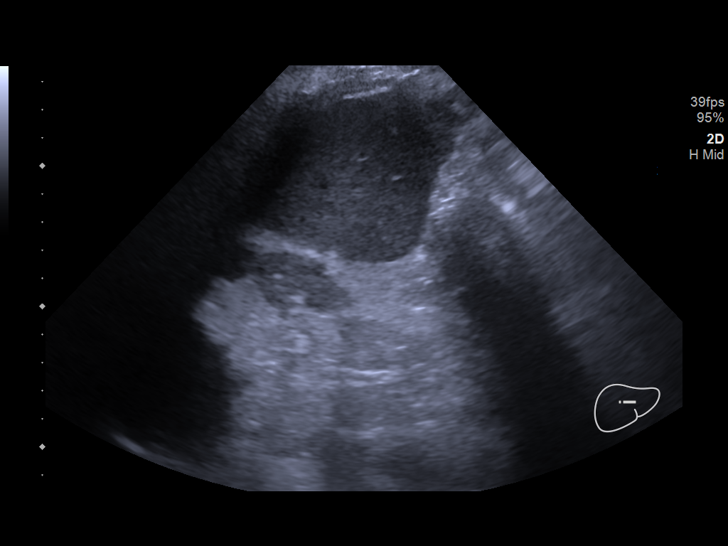
[im 39/47]
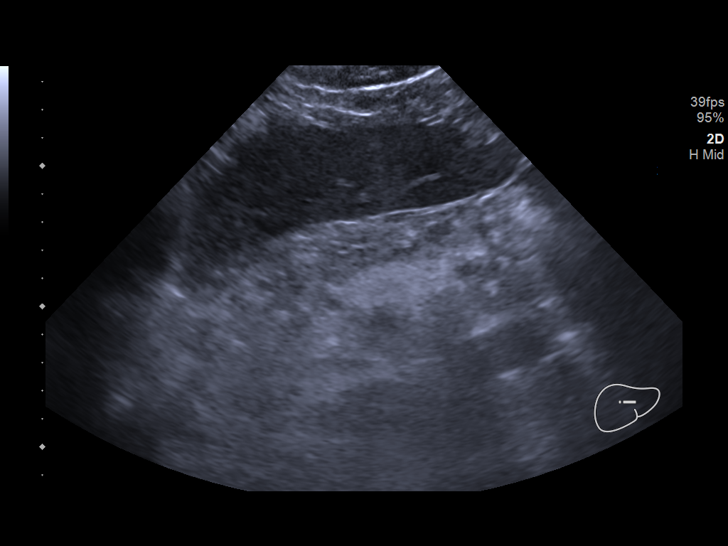
[im 43/47]
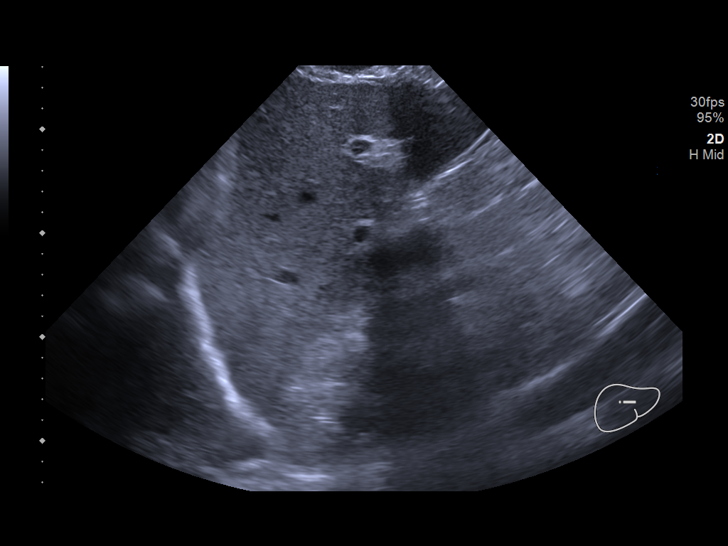
[im 47/47]
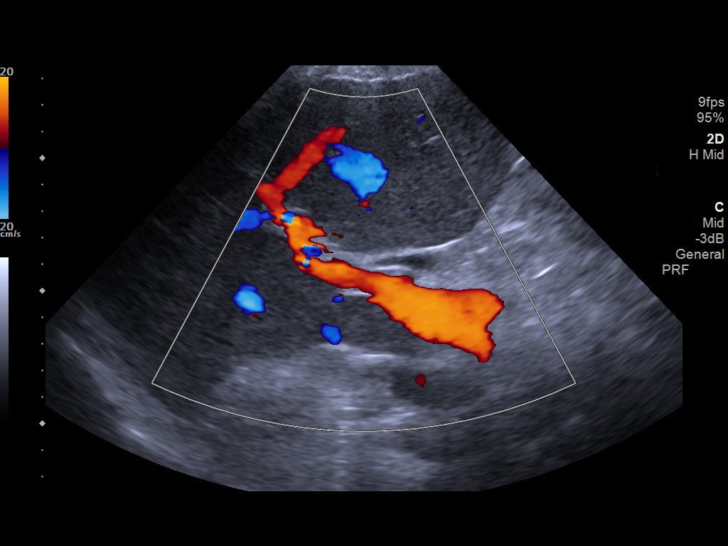

[14 of 25 positions shown; findings below may reference images not displayed]

FINDINGS: Gallbladder:

Surgically absent.

Common bile duct:

Diameter: 0.6 cm

Liver:

No focal lesion identified. The scattered subcentimeter lesions
favored to represent hepatic cysts noted on prior CT 04/16/2021 are
not definitely visualized on today's exam. Within normal limits in
parenchymal echogenicity. Portal vein is patent on color Doppler
imaging with normal direction of blood flow towards the liver.

Other: Partially visualized right kidney with diffuse cortical
thinning and exophytic cortical cyst at the upper pole,
corresponding to findings on prior CT chest abdomen pelvis
04/16/2021.
IMPRESSION: Normal sonographic evaluation of the right upper abdomen.

## 2023-08-21 IMAGING — US US EXTREM  UP VENOUS*R*
1 series · 14 of 24 positions shown · non-contrast
Comparison: RIGHT upper extremity XRs, 07/13/2021.

CLINICAL DATA: Fall.  RIGHT upper extremity.



[Series 1: us venous img upper uni right (dvt) · portal-venous · 14 of 40 slices shown]
[im 1/40]
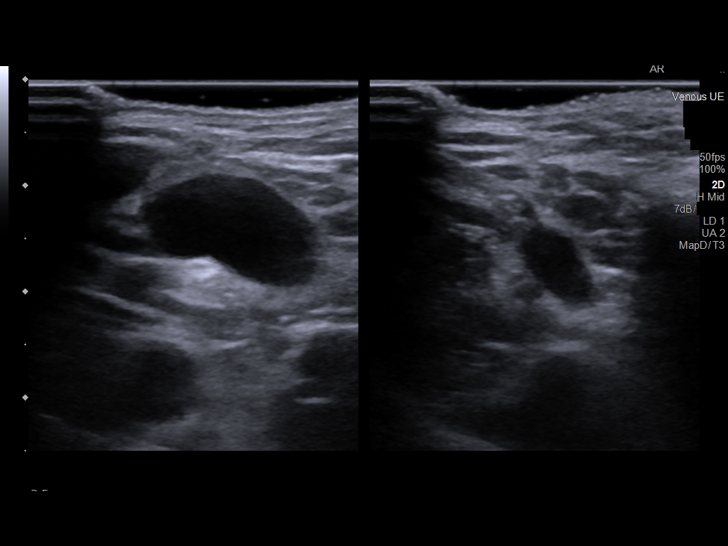
[im 4/40]
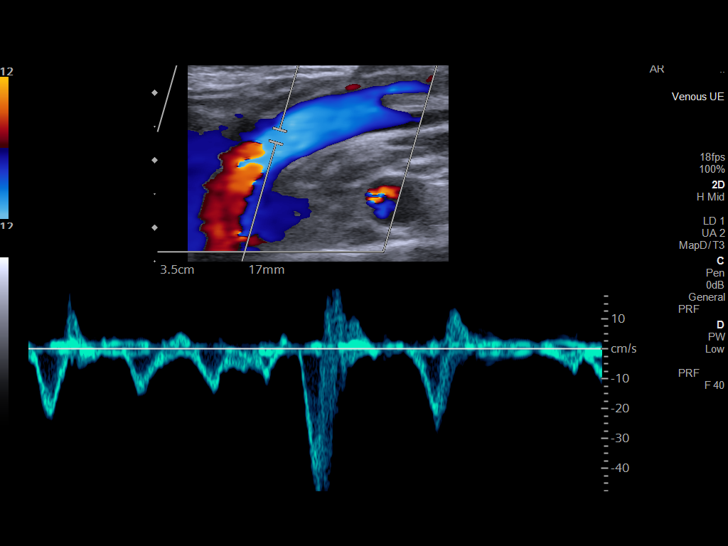
[im 7/40]
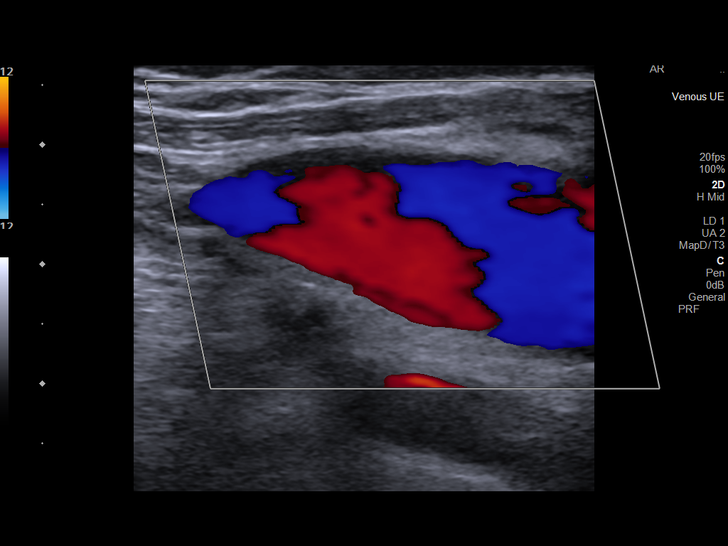
[im 11/40]
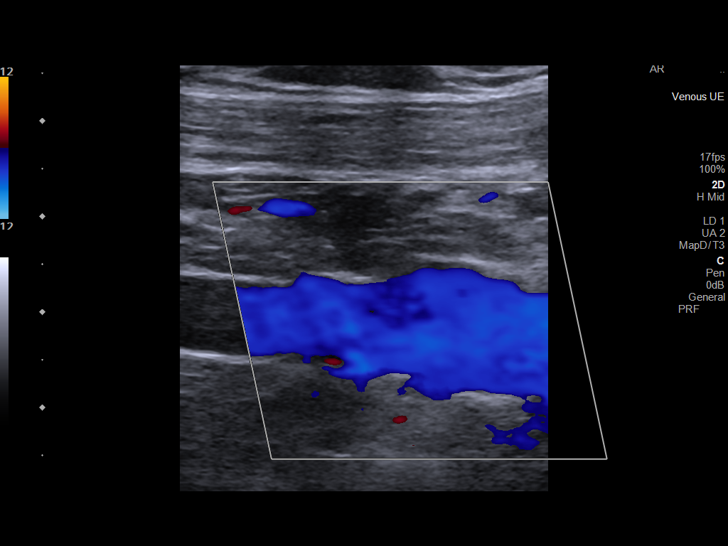
[im 12/40]
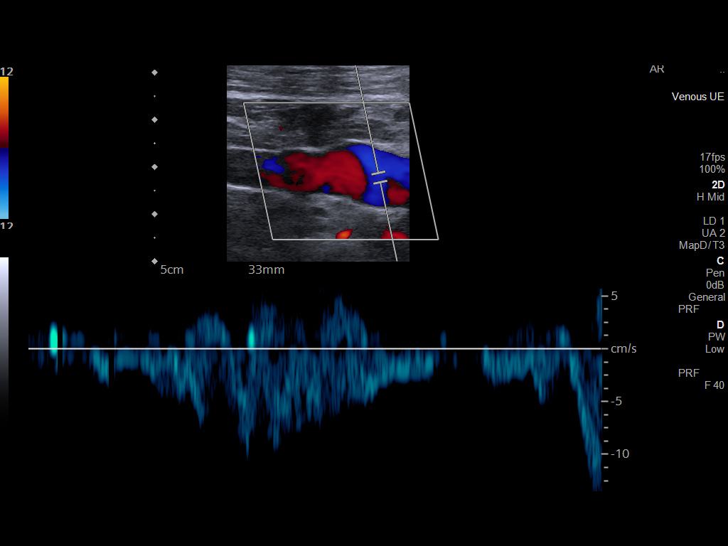
[im 16/40]
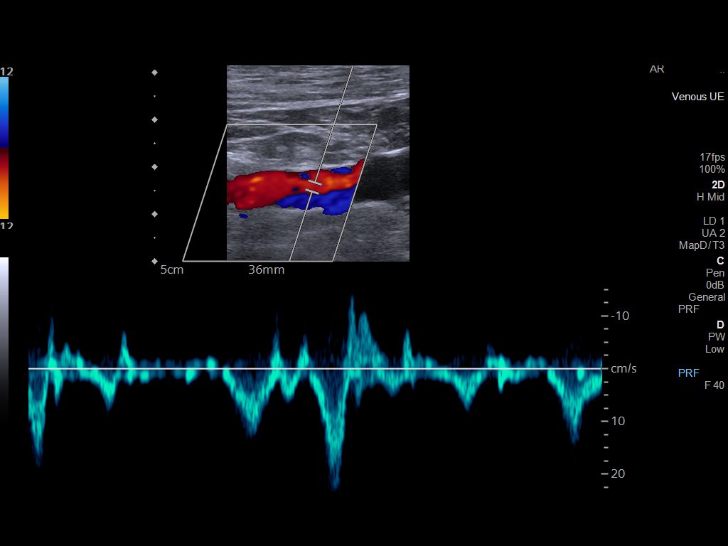
[im 19/40]
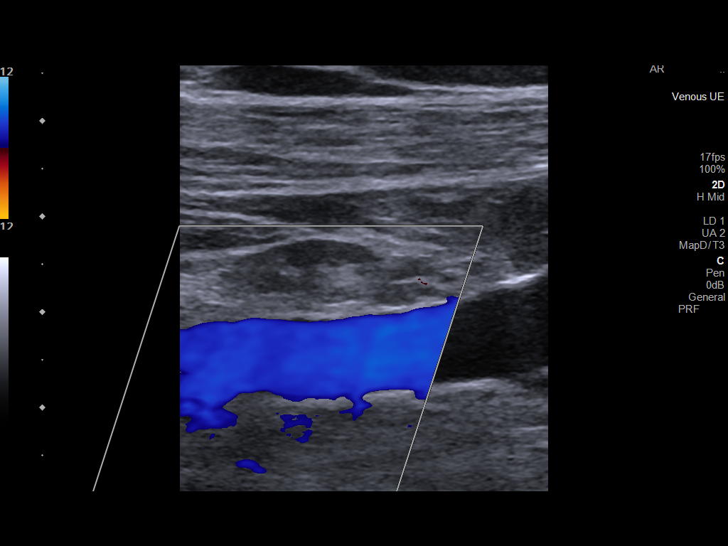
[im 21/40]
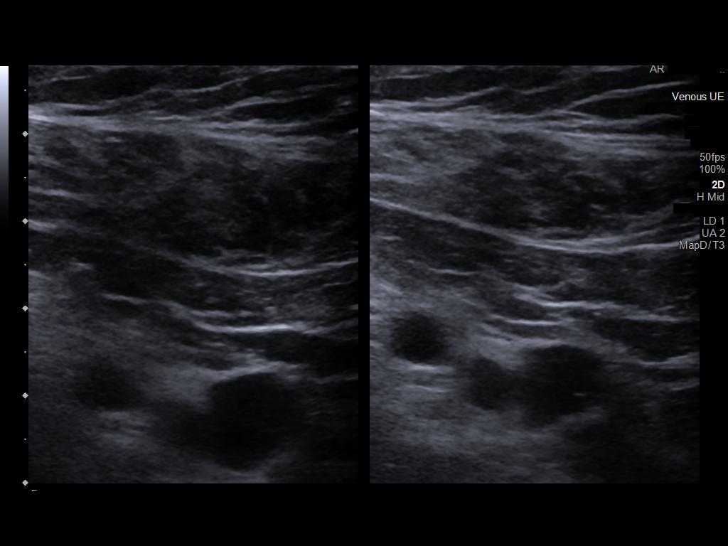
[im 24/40]
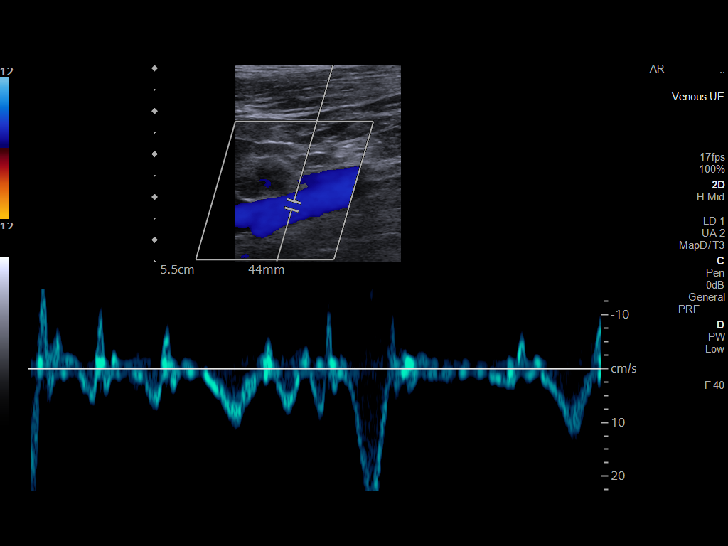
[im 28/40]
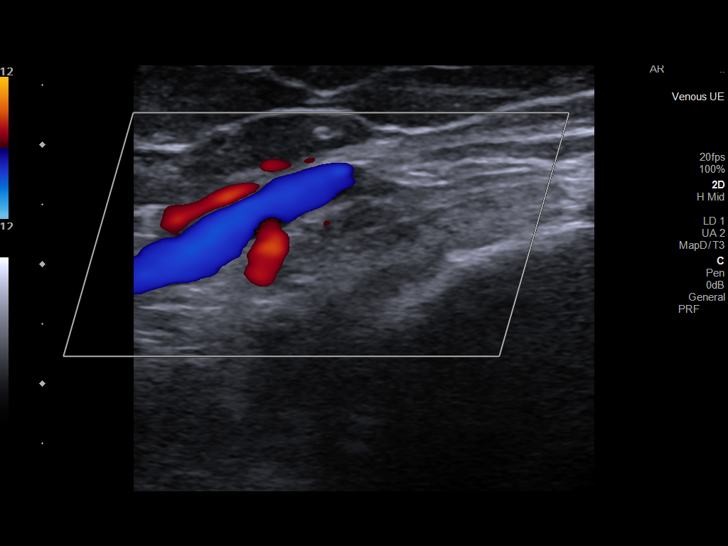
[im 31/40]
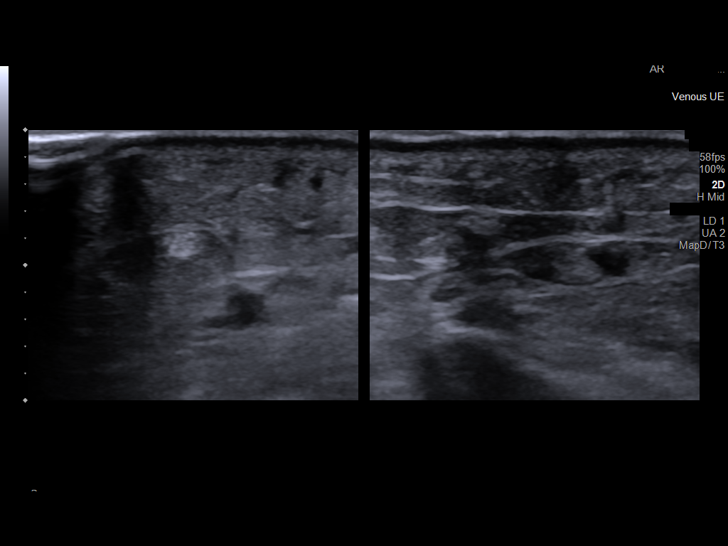
[im 33/40]
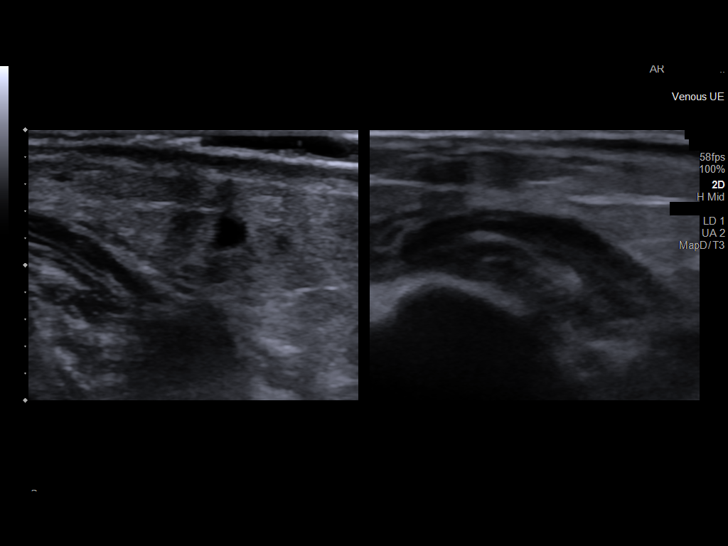
[im 36/40]
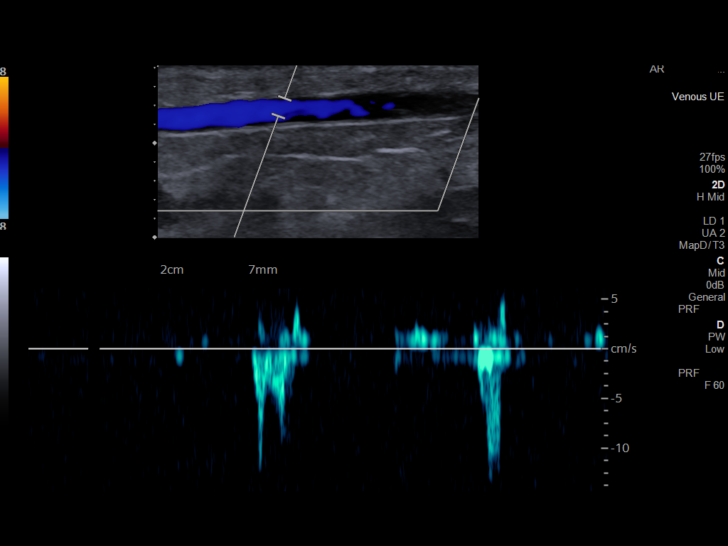
[im 40/40]
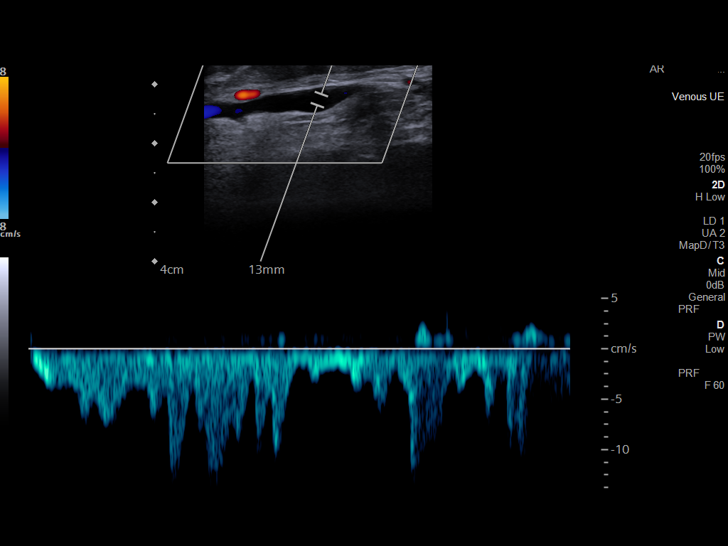

[14 of 24 positions shown; findings below may reference images not displayed]

FINDINGS: VENOUS

Normal compressibility of the RIGHT internal jugular, subclavian,
axillary, cephalic, basilic, brachial, radial and ulnar veins. No
filling defects to suggest DVT on grayscale or color Doppler
imaging. Doppler waveforms show normal direction of venous flow,
normal respiratory plasticity and response to augmentation.

Limited views of the contralateral subclavian vein are unremarkable.

OTHER

No evidence of superficial thrombophlebitis or abnormal fluid
collection.

Limitations: none
IMPRESSION: No evidence of DVT within the RIGHT upper extremity.
